# Patient Record
Sex: Female | Born: 1955
Health system: Southern US, Community
[De-identification: ages and names within clinical notes are randomized; demographics above are authoritative.]

## PROBLEM LIST (undated history)

## (undated) DIAGNOSIS — D171 Benign lipomatous neoplasm of skin and subcutaneous tissue of trunk: Secondary | ICD-10-CM

## (undated) DIAGNOSIS — Z923 Personal history of irradiation: Secondary | ICD-10-CM

## (undated) DIAGNOSIS — G4733 Obstructive sleep apnea (adult) (pediatric): Principal | ICD-10-CM

## (undated) DIAGNOSIS — I499 Cardiac arrhythmia, unspecified: Secondary | ICD-10-CM

## (undated) DIAGNOSIS — C801 Malignant (primary) neoplasm, unspecified: Secondary | ICD-10-CM

## (undated) DIAGNOSIS — I471 Supraventricular tachycardia: Secondary | ICD-10-CM

## (undated) DIAGNOSIS — I10 Essential (primary) hypertension: Secondary | ICD-10-CM

## (undated) DIAGNOSIS — C50911 Malignant neoplasm of unspecified site of right female breast: Secondary | ICD-10-CM

## (undated) DIAGNOSIS — E785 Hyperlipidemia, unspecified: Secondary | ICD-10-CM

## (undated) HISTORY — PX: STAPEDECTOMY: SHX2435

## (undated) HISTORY — PX: KNEE CARTILAGE SURGERY: SHX688

## (undated) HISTORY — DX: Essential (primary) hypertension: I10

## (undated) HISTORY — DX: Supraventricular tachycardia: I47.1

## (undated) HISTORY — DX: Hyperlipidemia, unspecified: E78.5

## (undated) HISTORY — PX: COLONOSCOPY: SHX174

## (undated) HISTORY — DX: Obstructive sleep apnea (adult) (pediatric): G47.33

---

## 1997-05-28 HISTORY — PX: ABDOMINAL HYSTERECTOMY: SHX81

## 1998-05-28 HISTORY — PX: OTHER SURGICAL HISTORY: SHX169

## 2002-05-28 DIAGNOSIS — I471 Supraventricular tachycardia, unspecified: Secondary | ICD-10-CM

## 2002-05-28 HISTORY — DX: Supraventricular tachycardia: I47.1

## 2002-05-28 HISTORY — DX: Supraventricular tachycardia, unspecified: I47.10

## 2012-12-16 ENCOUNTER — Ambulatory Visit (INDEPENDENT_AMBULATORY_CARE_PROVIDER_SITE_OTHER): Payer: 59 | Admitting: Cardiology

## 2012-12-16 ENCOUNTER — Encounter: Payer: Self-pay | Admitting: Cardiology

## 2012-12-16 VITALS — BP 116/78 | HR 67 | Ht 69.0 in | Wt 173.1 lb

## 2012-12-16 DIAGNOSIS — I471 Supraventricular tachycardia: Secondary | ICD-10-CM

## 2012-12-16 DIAGNOSIS — R002 Palpitations: Secondary | ICD-10-CM

## 2012-12-16 DIAGNOSIS — I498 Other specified cardiac arrhythmias: Secondary | ICD-10-CM

## 2012-12-16 NOTE — Patient Instructions (Signed)
I think the pulsations in your neck is probably your carotid pulse moving your jugular vein -- sometimes this will be more notable during premature beats.  You clearly have premature beats -- simply based on your history.  After your ablation, these are no longer of concern.  Marykay Lex, MD

## 2012-12-25 ENCOUNTER — Encounter: Payer: Self-pay | Admitting: Cardiology

## 2012-12-25 DIAGNOSIS — I471 Supraventricular tachycardia: Secondary | ICD-10-CM | POA: Insufficient documentation

## 2012-12-25 DIAGNOSIS — R002 Palpitations: Secondary | ICD-10-CM | POA: Insufficient documentation

## 2012-12-25 NOTE — Assessment & Plan Note (Signed)
Probably pretty successful ablation procedure, with no recurrence of SVT since the ablation. Prior to the ablation she was having them daily several times a day repeatedly.   The rhythm is very unlikely to recur. She is however very acutely aware of what feels like and will seek assistance if it occurs.

## 2012-12-25 NOTE — Progress Notes (Signed)
Patient ID: [name] [name], female   DOB: 1956/01/05, 57 y.o.   MRN: [id number]  PCP: Herb Grays, MD  Clinic Note: Chief Complaint  Patient presents with  . New Evaluation    aware of heart beat in carotid seveal bets at a time ,hx radio abalation 2004 in [place],no chest ain ,no sob, no edema     HPI: [name] [name] is a 57 y.o. female with a PMH below who presents today for evaluation of palpitations. She is a very interesting history in, that begins several years ago back in her home that she [place]. Her son unexpectedly died after an episode palpitations and was thought that had a ruptured aorta. Right around the time that she as well as couple of her siblings and cousins all had similar conditions in the bone at SVT. Very frequent SVTs that were very debilitating to her. She Underwent RFA ablation in 2004, and has had no further prolonged arrhythmia since. She does have intermittent palpitations, that she is probably more were of then mostly would be. She says that when she feels these symptoms they can make her feel fatigued. She'll and several of the time but nothing prolonged. She describes the bases irregular heartbeats that are couple in a row followed by a pause, never more than a few seconds.  Interval History: She saw Dr. Collins [place] back in July and noted feeling a sensation of a more the palpitations and she had in the past. She said they're not associated with any chest pain or discomfort. She is aware of them. It makes her feel somewhat fatigued in the time. She denies any lightheadedness, dizziness or wooziness. No syncope or near-syncope. No diaphoresis. No chest pain or shortness breath at rest or exertion. Symptoms do not get worse with exertion, in fact they get better / upper respiratory with exertion. She's very healthy, slim is maybe 45 minutes a day in the morning she does not take any medications. She does not drink lots of caffeine simply because of her history.  She  denies any known hematochezia or hematuria. No TIA or amaurosis fugax symptoms.    In general she is not overly worried about these symptoms, she just thought that she may want meet a cardiologist in town since she does have a history of SVT ablation.  Past Medical History  Diagnosis Date  . Paroxysmal SVT (supraventricular tachycardia) 2004    Status post RFA ABLATION    Prior Cardiac Evaluation and Past Surgical History: Past Surgical History  Procedure Laterality Date  . Stapedectomy    . Abdominal hysterectomy  1999    WITH BLADDER REPAIR FOR PROPLAPSE-HAS OVARIES (NO CA)  . Svt ablation  2000   No Known Allergies   History   Social History  . Marital Status: Married    Spouse Name: N/A    Number of Children: N/A  . Years of Education: N/A   Occupational History  . Not on file.   Social History Main Topics  . Smoking status: Former Smoker    Start date: 12/16/1977  . Smokeless tobacco: Not on file  . Alcohol Use: 2.5 oz/week    5 drink(s) per week     Comment: red wine  . Drug Use: No  . Sexually Active: Not on file   Other Topics Concern  . Not on file   Social History Narrative   She is married to [employer]. They have 2 children, and one son died at age 21 with  complications of an arrhythmia -- presumably yet record aorta.   Is a former smoker -- quit over 30 years ago. She maybe has one or 2 vessel Redwine periodically. Currently unemployed. She recently returned related to narcotic from [facility].    ROS: A comprehensive Review of Systems - Negative except Pertinent cardiac positive as noted above. Noncardiac concerns -  facial moles.  PHYSICAL EXAM BP 116/78  Pulse 67  Ht 5\' 9"  (1.753 m)  Wt 173 lb 1.6 oz (78.518 kg)  BMI 25.55 kg/m2 General appearance: alert, cooperative, appears stated age, no distress and healthy-appearing. Well-nourished and well-groomed. As a mood and affect. Its questions appropriately. Very knowledgeable of her history. She  does seem to be anxious at baseline but in no acute distress. Not overly concerned about her symptoms. HEENT: [place]/AT, EOMI, MMM, anicteric sclera Neck: no adenopathy, no carotid bruit, no JVD, supple, symmetrical, trachea midline and thyroid not enlarged, symmetric, no tenderness/mass/nodules Lungs: clear to auscultation bilaterally, normal percussion bilaterally and good anatomic nonlabored Heart: regular rate and rhythm, S1, S2 normal, no murmur, click, rub or gallop, normal apical impulse and occasional ectopic beats noted. Abdomen: soft, non-tender; bowel sounds normal; no masses,  no organomegaly Extremities: extremities normal, atraumatic, no cyanosis or edema, no edema, redness or tenderness in the calves or thighs and no ulcers, gangrene or trophic changes Pulses: 2+ and symmetric Neurologic: Alert and oriented X 3, normal strength and tone. Normal symmetric reflexes. Normal coordination and gait  [id number] today: Yes Rate: 67 , Rhythm: Normal sinus rhythm, possible left atrial enlargement. Otherwise normal ECG.  Recent Labs: None  ASSESSMENT / PLAN: Relatively healthy woman who looks much younger than her stated age. She has an exciting history, but no active arrhythmias.   Palpitations As it is he describing are most likely PACs or PVCs. They do not sound like an arrhythmia, which is something she is quite aware of. The fact they don't last more than a few seconds would clearly argue against an arrhythmia.She is not interested in taking medications to treat them, unless they become bothersome to her.  Will simply monitor these symptoms, would only treat them if they become significant for her. She is not excited about concept of taking a beta blocker or calcium channel blocker at this point. She is simply an attempt to avoid anything with caffeine in it that can trigger these spells.  History: SVT (supraventricular tachycardia), status post RFA ablation Probably pretty successful  ablation procedure, with no recurrence of SVT since the ablation. Prior to the ablation she was having them daily several times a day repeatedly.   The rhythm is very unlikely to recur. She is however very acutely aware of what feels like and will seek assistance if it occurs.   Followup: When necessary.  We are always available.  DAVID W. Herbie Baltimore, M.D., M.S. [facility] [address] [place], [place]  [address]  [phone] Pager # [phone]

## 2012-12-25 NOTE — Assessment & Plan Note (Addendum)
As it is he describing are most likely PACs or PVCs. They do not sound like an arrhythmia, which is something she is quite aware of. The fact they don't last more than a few seconds would clearly argue against an arrhythmia.She is not interested in taking medications to treat them, unless they become bothersome to her.  Will simply monitor these symptoms, would only treat them if they become significant for her. She is not excited about concept of taking a beta blocker or calcium channel blocker at this point. She is simply an attempt to avoid anything with caffeine in it that can trigger these spells.

## 2014-05-07 ENCOUNTER — Emergency Department (HOSPITAL_BASED_OUTPATIENT_CLINIC_OR_DEPARTMENT_OTHER): Payer: 59

## 2014-05-07 ENCOUNTER — Emergency Department (HOSPITAL_BASED_OUTPATIENT_CLINIC_OR_DEPARTMENT_OTHER)
Admission: EM | Admit: 2014-05-07 | Discharge: 2014-05-07 | Disposition: A | Discharge status: Home / Self Care | Payer: 59 | Attending: Emergency Medicine | Admitting: Emergency Medicine

## 2014-05-07 ENCOUNTER — Encounter (HOSPITAL_BASED_OUTPATIENT_CLINIC_OR_DEPARTMENT_OTHER): Payer: Self-pay

## 2014-05-07 DIAGNOSIS — Y9389 Activity, other specified: Secondary | ICD-10-CM | POA: Diagnosis not present

## 2014-05-07 DIAGNOSIS — S8991XA Unspecified injury of right lower leg, initial encounter: Secondary | ICD-10-CM | POA: Diagnosis present

## 2014-05-07 DIAGNOSIS — Z8679 Personal history of other diseases of the circulatory system: Secondary | ICD-10-CM | POA: Insufficient documentation

## 2014-05-07 DIAGNOSIS — Y998 Other external cause status: Secondary | ICD-10-CM | POA: Diagnosis not present

## 2014-05-07 DIAGNOSIS — Y9289 Other specified places as the place of occurrence of the external cause: Secondary | ICD-10-CM | POA: Diagnosis not present

## 2014-05-07 DIAGNOSIS — Z87891 Personal history of nicotine dependence: Secondary | ICD-10-CM | POA: Insufficient documentation

## 2014-05-07 DIAGNOSIS — T1490XA Injury, unspecified, initial encounter: Secondary | ICD-10-CM

## 2014-05-07 DIAGNOSIS — W541XXA Struck by dog, initial encounter: Secondary | ICD-10-CM | POA: Diagnosis not present

## 2014-05-07 MED ORDER — HYDROCODONE-ACETAMINOPHEN 5-325 MG PO TABS: 2.0000 | ORAL_TABLET | ORAL | Status: DC | PRN

## 2014-05-07 NOTE — ED Provider Notes (Signed)
CSN: [id number]     Arrival date & time 05/07/14  1212 History   First MD Initiated Contact with Patient 05/07/14 1350     Chief Complaint  Patient presents with  . Fall     (Consider location/radiation/quality/duration/timing/severity/associated sxs/prior Treatment) HPI Comments: Patient is a 58 year old female who presents with right knee pain after a fall that occurred this morning. Patient reports the neighbor's door came running over to her yard to see her dog and ran directly into her right knee. She reports falling back to the ground. She reports sudden onset of throbbing and severe right knee pain without radiation. No other injury. No head trauma or LOC. Weight bearing activity makes the pain worse. No alleviating factors. No other injury.   Patient is a 58 y.o. female presenting with fall.  Fall Associated symptoms include arthralgias and joint swelling.    Past Medical History  Diagnosis Date  . Paroxysmal SVT (supraventricular tachycardia) 2004    Status post RFA ABLATION   Past Surgical History  Procedure Laterality Date  . Stapedectomy    . Abdominal hysterectomy  1999    WITH BLADDER REPAIR FOR PROPLAPSE-HAS OVARIES (NO CA)  . Svt ablation  2000  . Knee cartilage surgery     Family History  Problem Relation Age of Onset  . CVA Mother   . Bladder Cancer Father   . CVA Maternal Grandmother     x2  . Heart attack Maternal Grandfather   . Leukemia Paternal Grandfather   . Supraventricular tachycardia Son     Actually passed away at age 35.  . Supraventricular tachycardia Sister   . Supraventricular tachycardia Brother   . Supraventricular tachycardia Grandchild     4 grandchildren   History  Substance Use Topics  . Smoking status: Former Smoker    Start date: 12/16/1977  . Smokeless tobacco: Not on file  . Alcohol Use: Yes     Comment: red wine   OB History    No data available     Review of Systems  Musculoskeletal: Positive for joint swelling  and arthralgias.  All other systems reviewed and are negative.     Allergies  Review of patient's allergies indicates no known allergies.  Home Medications   Prior to Admission medications   Not on File   BP 136/87 mmHg  Pulse 80  Temp(Src) 98.3 F (36.8 C) (Oral)  Resp 18  Ht 5\' 8"  (1.727 m)  Wt 178 lb (80.74 kg)  BMI 27.07 kg/m2  SpO2 99% Physical Exam  Constitutional: She is oriented to person, place, and time. She appears well-developed and well-nourished. No distress.  HENT:  Head: Normocephalic and atraumatic.  Eyes: Conjunctivae and EOM are normal.  Neck: Normal range of motion.  Cardiovascular: Normal rate, regular rhythm and intact distal pulses.  Exam reveals no gallop and no friction rub.   No murmur heard. Pulmonary/Chest: Effort normal and breath sounds normal. She has no wheezes. She has no rales. She exhibits no tenderness.  Abdominal: Soft. She exhibits no distension. There is no tenderness. There is no rebound and no guarding.  Musculoskeletal: Normal range of motion.  Full ROM of right knee. Mild anterior edema and tenderness to palpation. No obvious deformity. Negative Lachmans test. No popliteal tenderness.    Neurological: She is alert and oriented to person, place, and time. Coordination normal.  Speech is goal-oriented. Moves limbs without ataxia.   Skin: Skin is warm and dry.  Psychiatric: She has  a normal mood and affect. Her behavior is normal.  Nursing note and vitals reviewed.   ED Course  Procedures (including critical care time) Labs Review Labs Reviewed - No data to display  Imaging Review Dg Knee Complete 4 Views Right  05/07/2014   CLINICAL DATA:  Injury by dog today.  Initial evaluation .  EXAM: RIGHT KNEE - COMPLETE 4+ VIEW  COMPARISON:  None.  FINDINGS: There is no evidence of fracture, dislocation, or joint effusion. There is no evidence of arthropathy or other focal bone abnormality. Soft tissues are unremarkable.  IMPRESSION:  Negative.   Electronically Signed   By: Marcello Moores  Register   On: 05/07/2014 13:15     EKG Interpretation None      MDM   Final diagnoses:  Right knee injury, initial encounter    2:12 PM Patient's xray unremarkable for acute changes. Vitals stable and patient afebrile. Patient is unable to bear weight on the right leg. Patient has crutches and knee immobilizer at home that she is instructed to use. Patient sees orthopedist at [facility]. Patient instructed to follow up at the office.    Alvina Chou, PA-C 05/07/14 [place], MD 05/10/14 [phone]

## 2014-05-07 NOTE — Discharge Instructions (Signed)
Take Vicodin as needed for pain. Do not bear weight on your right knee. Rest, ice, and elevate your knee. Refer to attached documents for more information.

## 2014-05-07 NOTE — ED Notes (Addendum)
A dog ran into pt-knocked her to the ground-pain to right knee-walking with crutches

## 2014-05-24 ENCOUNTER — Other Ambulatory Visit: Payer: Self-pay | Admitting: Orthopaedic Surgery

## 2014-05-24 DIAGNOSIS — M25561 Pain in right knee: Secondary | ICD-10-CM

## 2014-05-25 ENCOUNTER — Other Ambulatory Visit (HOSPITAL_COMMUNITY): Payer: Self-pay | Admitting: Orthopaedic Surgery

## 2014-05-25 ENCOUNTER — Ambulatory Visit (HOSPITAL_COMMUNITY)
Admission: RE | Admit: 2014-05-25 | Discharge: 2014-05-25 | Disposition: A | Discharge status: Home / Self Care | Payer: 59 | Source: Ambulatory Visit | Attending: Orthopaedic Surgery | Admitting: Orthopaedic Surgery

## 2014-05-25 ENCOUNTER — Ambulatory Visit
Admission: RE | Admit: 2014-05-25 | Discharge: 2014-05-25 | Disposition: A | Discharge status: Home / Self Care | Payer: 59 | Source: Ambulatory Visit | Attending: Orthopaedic Surgery | Admitting: Orthopaedic Surgery

## 2014-05-25 DIAGNOSIS — W541XXA Struck by dog, initial encounter: Secondary | ICD-10-CM | POA: Diagnosis not present

## 2014-05-25 DIAGNOSIS — M25461 Effusion, right knee: Secondary | ICD-10-CM | POA: Diagnosis not present

## 2014-05-25 DIAGNOSIS — M25561 Pain in right knee: Secondary | ICD-10-CM

## 2014-05-25 DIAGNOSIS — S89001A Unspecified physeal fracture of upper end of right tibia, initial encounter for closed fracture: Secondary | ICD-10-CM | POA: Insufficient documentation

## 2014-05-25 DIAGNOSIS — S8990XA Unspecified injury of unspecified lower leg, initial encounter: Secondary | ICD-10-CM | POA: Diagnosis present

## 2014-05-25 DIAGNOSIS — M2241 Chondromalacia patellae, right knee: Secondary | ICD-10-CM | POA: Insufficient documentation

## 2014-05-28 DIAGNOSIS — C801 Malignant (primary) neoplasm, unspecified: Secondary | ICD-10-CM

## 2014-05-28 DIAGNOSIS — C50911 Malignant neoplasm of unspecified site of right female breast: Secondary | ICD-10-CM

## 2014-05-28 HISTORY — DX: Malignant (primary) neoplasm, unspecified: C80.1

## 2014-05-28 HISTORY — DX: Malignant neoplasm of unspecified site of right female breast: C50.911

## 2014-09-30 ENCOUNTER — Other Ambulatory Visit: Payer: Self-pay | Admitting: Radiology

## 2014-10-07 ENCOUNTER — Other Ambulatory Visit: Payer: Self-pay | Admitting: General Surgery

## 2014-10-07 DIAGNOSIS — C50911 Malignant neoplasm of unspecified site of right female breast: Secondary | ICD-10-CM

## 2014-10-07 DIAGNOSIS — Z170 Estrogen receptor positive status [ER+]: Principal | ICD-10-CM

## 2014-10-15 ENCOUNTER — Telehealth: Payer: Self-pay | Admitting: *Deleted

## 2014-10-15 NOTE — Telephone Encounter (Signed)
Received referral from [facility].  Called and left a message for the pt to return my call so I can schedule a med onc appt.  

## 2014-10-18 ENCOUNTER — Telehealth: Payer: Self-pay | Admitting: *Deleted

## 2014-10-18 NOTE — Telephone Encounter (Signed)
Received message from pt returning my call.  Called and left a message for the pt to return my call so I can schedule her a med onc appt.

## 2014-10-18 NOTE — Telephone Encounter (Signed)
Pt returned my call and I confirmed 10/22/14 appt w/ her.  Unable to mail before appt letter - gave verbal.  Unable to mail welcoming packet - gave directions and instructions.  Unable to mail intake form - placed a note for one to be given at time of check in.  Emailed Engineer, civil (consulting) at [employer] to make her aware.  Placed a copy of records in Dr. Geralyn Flash box and took one to HIM to scan.

## 2014-10-22 ENCOUNTER — Ambulatory Visit: Payer: 59

## 2014-10-22 ENCOUNTER — Ambulatory Visit (HOSPITAL_BASED_OUTPATIENT_CLINIC_OR_DEPARTMENT_OTHER): Payer: 59 | Admitting: Hematology and Oncology

## 2014-10-22 ENCOUNTER — Telehealth: Payer: Self-pay | Admitting: Hematology and Oncology

## 2014-10-22 ENCOUNTER — Encounter: Payer: Self-pay | Admitting: *Deleted

## 2014-10-22 ENCOUNTER — Encounter: Payer: Self-pay | Admitting: Hematology and Oncology

## 2014-10-22 VITALS — BP 129/70 | HR 86 | Temp 97.7°F | Resp 18 | Ht 69.0 in | Wt 173.7 lb

## 2014-10-22 DIAGNOSIS — C50411 Malignant neoplasm of upper-outer quadrant of right female breast: Secondary | ICD-10-CM | POA: Diagnosis not present

## 2014-10-22 DIAGNOSIS — Z801 Family history of malignant neoplasm of trachea, bronchus and lung: Secondary | ICD-10-CM

## 2014-10-22 DIAGNOSIS — Z803 Family history of malignant neoplasm of breast: Secondary | ICD-10-CM | POA: Diagnosis not present

## 2014-10-22 DIAGNOSIS — Z170 Estrogen receptor positive status [ER+]: Secondary | ICD-10-CM | POA: Diagnosis not present

## 2014-10-22 DIAGNOSIS — Z8041 Family history of malignant neoplasm of ovary: Secondary | ICD-10-CM

## 2014-10-22 DIAGNOSIS — Z8052 Family history of malignant neoplasm of bladder: Secondary | ICD-10-CM

## 2014-10-22 DIAGNOSIS — Z87891 Personal history of nicotine dependence: Secondary | ICD-10-CM | POA: Diagnosis not present

## 2014-10-22 NOTE — Assessment & Plan Note (Signed)
Right breast biopsy 09/30/2014 invasive ductal carcinoma grade 1, Ki-67 5%, ER positive, PR positive, HER-2 negative, 1.4 cm mass at 11:30 position no lymph nodes by ultrasound T1 cN0 M0 stage IA clinical stage  Pathology and radiology counseling:Discussed with the patient, the details of pathology including the type of breast cancer,the clinical staging, the significance of ER, PR and HER-2/neu receptors and the implications for treatment. After reviewing the pathology in detail, we proceeded to discuss the different treatment options between surgery, radiation, chemotherapy, antiestrogen therapies.  Recommendations: 1. Breast conserving surgery followed by 2. Oncotype DX testing to determine if chemotherapy would be of any benefit followed by 3. Adjuvant radiation therapy followed by 4. Adjuvant antiestrogen therapy  Oncotype counseling: I discussed Oncotype DX test. I explained to the patient that this is a 21 gene panel to evaluate patient tumors DNA to calculate recurrence score. This would help determine whether patient has high risk or intermediate risk or low risk breast cancer. She understands that if her tumor was found to be high risk, she would benefit from systemic chemotherapy. If low risk, no need of chemotherapy. If she was found to be intermediate risk, we would need to evaluate the score as well as other risk factors and determine if an abbreviated chemotherapy may be of benefit.  Patient's surgery is scheduled for 11/02/2014. I will see her walker Oncotype results become available.

## 2014-10-22 NOTE — Progress Notes (Signed)
[facility] CONSULT NOTE  Patient Care Team: [name], MD as PCP - General (Nurse Practitioner)  CHIEF COMPLAINTS/PURPOSE OF CONSULTATION:  Newly diagnosed breast cancer  HISTORY OF PRESENTING ILLNESS:  [name] [name] 59 y.o. female is here because of recent diagnosis of right breast cancer. Patient had a routine screening mammograms 3-D which detected abnormality in the right breast. This was evaluated by ultrasound and a biopsy was performed. The ultrasound revealed a 1.4 send a mass effect 11:30 position. No lymph nodes were identified. The biopsy came back as invasive ductal carcinoma grade 1 with a Ki-67 5% that was ER/PR positive HER-2 negative. She was seen by Dr. Excell Seltzer who sent her for discussion regarding treatment options. Patient scheduled to have a lumpectomy on 11/02/2014.  I reviewed her records extensively and collaborated the history with the patient.  SUMMARY OF ONCOLOGIC HISTORY:   Primary cancer of upper outer quadrant of right breast   09/30/2014 Initial Diagnosis Right breast invasive ductal carcinoma grade 1, Ki-67 5%, ER positive, PR positive, HER-2 negative, 1.4 cm mass at 11:30 position no lymph nodes by ultrasound T1 cN0 M0 stage IA clinical stage    In terms of breast cancer risk profile:  She menarched at early age of 22 and went to menopause at age 61  She had 2 pregnancy, her first child was born at age 33  She has received birth control pills for approximately 5 years.  She was never exposed to fertility medications or hormone replacement therapy.  She has  family history of Breast/GYN/GI cancer Father age 53 bladder cancer, cousin age 16 breast cancer; 2 aunts and 30s with lung cancer one aunt in 54s with lung cancer and another aunt in 33s with ovarian cancer  MEDICAL HISTORY:  Past Medical History  Diagnosis Date  . Paroxysmal SVT (supraventricular tachycardia) 2004    Status post RFA ABLATION    SURGICAL HISTORY: Past Surgical  History  Procedure Laterality Date  . Stapedectomy    . Abdominal hysterectomy  1999    WITH BLADDER REPAIR FOR PROPLAPSE-HAS OVARIES (NO CA)  . Svt ablation  2000  . Knee cartilage surgery      SOCIAL HISTORY: History   Social History  . Marital Status: Married    Spouse Name: N/A  . Number of Children: N/A  . Years of Education: N/A   Occupational History  . Not on file.   Social History Main Topics  . Smoking status: Former Smoker    Start date: 12/16/1977  . Smokeless tobacco: Not on file  . Alcohol Use: Yes     Comment: red wine  . Drug Use: No  . Sexual Activity: Not on file   Other Topics Concern  . Not on file   Social History Narrative   She is married to [employer]. They have 2 children, and one son died at age 54 with complications of an arrhythmia -- presumably yet record aorta.   Is a former smoker -- quit over 30 years ago. She maybe has one or 2 vessel [place]wine periodically. Currently unemployed. She recently returned related to narcotic from [facility].    FAMILY HISTORY: Family History  Problem Relation Age of Onset  . CVA Mother   . Bladder Cancer Father   . CVA Maternal Grandmother     x2  . Heart attack Maternal Grandfather   . Leukemia Paternal Grandfather   . Supraventricular tachycardia Son     Actually passed away at age  19.  . Supraventricular tachycardia Sister   . Supraventricular tachycardia Brother   . Supraventricular tachycardia Grandchild     4 grandchildren    ALLERGIES:  has No Known Allergies.  MEDICATIONS:  Current Outpatient Prescriptions  Medication Sig Dispense Refill  . HYDROcodone-acetaminophen (NORCO/VICODIN) 5-325 MG per tablet Take 2 tablets by mouth every 4 (four) hours as needed for moderate pain or severe pain. 20 tablet 0   No current facility-administered medications for this visit.    REVIEW OF SYSTEMS:   Constitutional: Denies fevers, chills or abnormal night sweats Eyes: Denies blurriness of vision,  double vision or watery eyes Ears, nose, mouth, throat, and face: Denies mucositis or sore throat Respiratory: Denies cough, dyspnea or wheezes Cardiovascular: Denies palpitation, chest discomfort or lower extremity swelling Gastrointestinal:  Denies nausea, heartburn or change in bowel habits Skin: Denies abnormal skin rashes Lymphatics: Denies new lymphadenopathy or easy bruising Neurological:Denies numbness, tingling or new weaknesses Behavioral/Psych: Mood is stable, no new changes  Breast:  Denies any palpable lumps or discharge All other systems were reviewed with the patient and are negative.  PHYSICAL EXAMINATION: ECOG PERFORMANCE STATUS: 0 - Asymptomatic  Filed Vitals:   10/22/14 1244  BP: 129/70  Pulse: 86  Temp: 97.7 F (36.5 C)  Resp: 18   Filed Weights   10/22/14 1244  Weight: 173 lb 11.2 oz (78.79 kg)    GENERAL:alert, no distress and comfortable SKIN: skin color, texture, turgor are normal, no rashes or significant lesions EYES: normal, conjunctiva are pink and non-injected, sclera clear OROPHARYNX:no exudate, no erythema and lips, buccal mucosa, and tongue normal  NECK: supple, thyroid normal size, non-tender, without nodularity LYMPH:  no palpable lymphadenopathy in the cervical, axillary or inguinal LUNGS: clear to auscultation and percussion with normal breathing effort HEART: regular rate & rhythm and no murmurs and no lower extremity edema ABDOMEN:abdomen soft, non-tender and normal bowel sounds Musculoskeletal:no cyanosis of digits and no clubbing  PSYCH: alert & oriented x 3 with fluent speech NEURO: no focal motor/sensory deficits BREAST: No palpable nodules in breast. No palpable axillary or supraclavicular lymphadenopathy (exam performed in the presence of a chaperone)   ASSESSMENT AND PLAN:  Primary cancer of upper outer quadrant of right breast Right breast biopsy 09/30/2014 invasive ductal carcinoma grade 1, Ki-67 5%, ER positive, PR positive,  HER-2 negative, 1.4 cm mass at 11:30 position no lymph nodes by ultrasound T1 cN0 M0 stage IA clinical stage  Pathology and radiology counseling:Discussed with the patient, the details of pathology including the type of breast cancer,the clinical staging, the significance of ER, PR and HER-2/neu receptors and the implications for treatment. After reviewing the pathology in detail, we proceeded to discuss the different treatment options between surgery, radiation, chemotherapy, antiestrogen therapies.  Recommendations: 1. Breast conserving surgery followed by 2. Oncotype DX testing to determine if chemotherapy would be of any benefit followed by 3. Adjuvant radiation therapy followed by 4. Adjuvant antiestrogen therapy  Oncotype counseling: I discussed Oncotype DX test. I explained to the patient that this is a 21 gene panel to evaluate patient tumors DNA to calculate recurrence score. This would help determine whether patient has high risk or intermediate risk or low risk breast cancer. She understands that if her tumor was found to be high risk, she would benefit from systemic chemotherapy. If low risk, no need of chemotherapy. If she was found to be intermediate risk, we would need to evaluate the score as well as other risk factors and determine if  an abbreviated chemotherapy may be of benefit.  Patient's surgery is scheduled for 11/02/2014. I will see her walker Oncotype results become available.  All questions were answered. The patient knows to call the clinic with any problems, questions or concerns.    Rulon Eisenmenger, MD 1:46 PM

## 2014-10-22 NOTE — Telephone Encounter (Signed)
Gave avs & calendar for June. °

## 2014-10-22 NOTE — Progress Notes (Signed)
Met with pt after new pt appt. Gave navigation resources and contact information. Discussed care plan summary and next steps.  Encourage pt to call with questions or concerns. Received verbal understanding.

## 2014-10-22 NOTE — Addendum Note (Signed)
Addended by: Renford Dills on: 10/22/2014 03:22 PM   Modules accepted: Orders, Medications

## 2014-10-22 NOTE — Progress Notes (Signed)
I checked in new patient with no issues prior to seeing the dr. She has packet to fill out per notes.

## 2014-10-29 ENCOUNTER — Encounter (HOSPITAL_BASED_OUTPATIENT_CLINIC_OR_DEPARTMENT_OTHER): Payer: Self-pay | Admitting: *Deleted

## 2014-11-02 ENCOUNTER — Encounter (HOSPITAL_BASED_OUTPATIENT_CLINIC_OR_DEPARTMENT_OTHER): Payer: Self-pay | Admitting: *Deleted

## 2014-11-02 ENCOUNTER — Ambulatory Visit (HOSPITAL_BASED_OUTPATIENT_CLINIC_OR_DEPARTMENT_OTHER): Payer: 59 | Admitting: Anesthesiology

## 2014-11-02 ENCOUNTER — Ambulatory Visit (HOSPITAL_BASED_OUTPATIENT_CLINIC_OR_DEPARTMENT_OTHER)
Admission: RE | Admit: 2014-11-02 | Discharge: 2014-11-02 | Disposition: A | Discharge status: Home / Self Care | Payer: 59 | Source: Ambulatory Visit | Attending: General Surgery | Admitting: General Surgery

## 2014-11-02 ENCOUNTER — Encounter (HOSPITAL_COMMUNITY)
Admission: RE | Admit: 2014-11-02 | Discharge: 2014-11-02 | Disposition: A | Discharge status: Home / Self Care | Payer: 59 | Source: Ambulatory Visit | Attending: General Surgery | Admitting: General Surgery

## 2014-11-02 ENCOUNTER — Encounter (HOSPITAL_BASED_OUTPATIENT_CLINIC_OR_DEPARTMENT_OTHER)
Admission: RE | Disposition: A | Discharge status: Home / Self Care | Payer: Self-pay | Source: Ambulatory Visit | Attending: General Surgery

## 2014-11-02 DIAGNOSIS — C50411 Malignant neoplasm of upper-outer quadrant of right female breast: Secondary | ICD-10-CM | POA: Diagnosis not present

## 2014-11-02 DIAGNOSIS — Z170 Estrogen receptor positive status [ER+]: Secondary | ICD-10-CM | POA: Insufficient documentation

## 2014-11-02 DIAGNOSIS — N959 Unspecified menopausal and perimenopausal disorder: Secondary | ICD-10-CM | POA: Diagnosis not present

## 2014-11-02 DIAGNOSIS — C50911 Malignant neoplasm of unspecified site of right female breast: Secondary | ICD-10-CM | POA: Insufficient documentation

## 2014-11-02 HISTORY — PX: BREAST LUMPECTOMY WITH RADIOACTIVE SEED AND SENTINEL LYMPH NODE BIOPSY: SHX6550

## 2014-11-02 HISTORY — DX: Malignant (primary) neoplasm, unspecified: C80.1

## 2014-11-02 HISTORY — DX: Cardiac arrhythmia, unspecified: I49.9

## 2014-11-02 SURGERY — BREAST LUMPECTOMY WITH RADIOACTIVE SEED AND SENTINEL LYMPH NODE BIOPSY
Anesthesia: General | Site: Breast | Laterality: Right

## 2014-11-02 MED ORDER — LACTATED RINGERS IV SOLN
INTRAVENOUS | Status: DC
  Administered 2014-11-02 (×2): via INTRAVENOUS

## 2014-11-02 MED ORDER — PROPOFOL 10 MG/ML IV BOLUS
INTRAVENOUS | Status: AC
  Filled 2014-11-02: qty 20

## 2014-11-02 MED ORDER — FENTANYL CITRATE (PF) 100 MCG/2ML IJ SOLN
50.0000 ug | INTRAMUSCULAR | Status: DC | PRN
  Administered 2014-11-02: 100 ug via INTRAVENOUS

## 2014-11-02 MED ORDER — MIDAZOLAM HCL 2 MG/2ML IJ SOLN
1.0000 mg | INTRAMUSCULAR | Status: DC | PRN
  Administered 2014-11-02: 2 mg via INTRAVENOUS

## 2014-11-02 MED ORDER — OXYCODONE HCL 5 MG PO TABS
ORAL_TABLET | ORAL | Status: AC
  Filled 2014-11-02: qty 1

## 2014-11-02 MED ORDER — MIDAZOLAM HCL 2 MG/2ML IJ SOLN
INTRAMUSCULAR | Status: AC
  Filled 2014-11-02: qty 2

## 2014-11-02 MED ORDER — BUPIVACAINE-EPINEPHRINE (PF) 0.25% -1:200000 IJ SOLN
INTRAMUSCULAR | Status: DC | PRN
  Administered 2014-11-02: 10 mL

## 2014-11-02 MED ORDER — FENTANYL CITRATE (PF) 100 MCG/2ML IJ SOLN
INTRAMUSCULAR | Status: AC
  Filled 2014-11-02: qty 6

## 2014-11-02 MED ORDER — FENTANYL CITRATE (PF) 100 MCG/2ML IJ SOLN
INTRAMUSCULAR | Status: AC
  Filled 2014-11-02: qty 2

## 2014-11-02 MED ORDER — PROPOFOL 10 MG/ML IV BOLUS
INTRAVENOUS | Status: DC | PRN
  Administered 2014-11-02: 200 mg via INTRAVENOUS

## 2014-11-02 MED ORDER — FLEET ENEMA 7-19 GM/118ML RE ENEM: 1.0000 | ENEMA | Freq: Once | RECTAL | Status: DC

## 2014-11-02 MED ORDER — CEFAZOLIN SODIUM-DEXTROSE 2-3 GM-% IV SOLR
2.0000 g | INTRAVENOUS | Status: AC
  Administered 2014-11-02: 2 g via INTRAVENOUS

## 2014-11-02 MED ORDER — OXYCODONE HCL 5 MG/5ML PO SOLN
5.0000 mg | Freq: Once | ORAL | Status: AC | PRN
Start: 2014-11-02 — End: 2014-11-02

## 2014-11-02 MED ORDER — CEFAZOLIN SODIUM-DEXTROSE 2-3 GM-% IV SOLR
INTRAVENOUS | Status: AC
  Filled 2014-11-02: qty 50

## 2014-11-02 MED ORDER — FENTANYL CITRATE (PF) 100 MCG/2ML IJ SOLN
25.0000 ug | INTRAMUSCULAR | Status: DC | PRN
  Administered 2014-11-02 (×3): 25 ug via INTRAVENOUS

## 2014-11-02 MED ORDER — SODIUM CHLORIDE 0.9 % IJ SOLN
INTRAMUSCULAR | Status: DC | PRN
  Administered 2014-11-02: 5 mL

## 2014-11-02 MED ORDER — TECHNETIUM TC 99M SULFUR COLLOID FILTERED
1.0000 | Freq: Once | INTRAVENOUS | Status: AC | PRN
  Administered 2014-11-02: 1 via INTRADERMAL

## 2014-11-02 MED ORDER — DEXAMETHASONE SODIUM PHOSPHATE 4 MG/ML IJ SOLN
INTRAMUSCULAR | Status: DC | PRN
  Administered 2014-11-02: 10 mg via INTRAVENOUS

## 2014-11-02 MED ORDER — GLYCOPYRROLATE 0.2 MG/ML IJ SOLN
0.2000 mg | Freq: Once | INTRAMUSCULAR | Status: AC | PRN
  Administered 2014-11-02: 0.2 mg via INTRAVENOUS

## 2014-11-02 MED ORDER — OXYCODONE HCL 5 MG PO TABS
5.0000 mg | ORAL_TABLET | Freq: Once | ORAL | Status: AC | PRN
  Administered 2014-11-02: 5 mg via ORAL

## 2014-11-02 MED ORDER — BUPIVACAINE-EPINEPHRINE (PF) 0.5% -1:200000 IJ SOLN
INTRAMUSCULAR | Status: DC | PRN
  Administered 2014-11-02: 30 mL via PERINEURAL

## 2014-11-02 MED ORDER — HYDROCODONE-ACETAMINOPHEN 5-325 MG PO TABS: 1.0000 | ORAL_TABLET | ORAL | Status: DC | PRN

## 2014-11-02 MED ORDER — ONDANSETRON HCL 4 MG/2ML IJ SOLN: 4.0000 mg | Freq: Four times a day (QID) | INTRAMUSCULAR | Status: DC | PRN

## 2014-11-02 MED ORDER — LIDOCAINE HCL (CARDIAC) 20 MG/ML IV SOLN
INTRAVENOUS | Status: DC | PRN
  Administered 2014-11-02: 80 mg via INTRAVENOUS

## 2014-11-02 SURGICAL SUPPLY — 53 items
APPLIER CLIP 9.375 MED OPEN (MISCELLANEOUS) ×2
BINDER BREAST LRG (GAUZE/BANDAGES/DRESSINGS) IMPLANT
BINDER BREAST MEDIUM (GAUZE/BANDAGES/DRESSINGS) IMPLANT
BINDER BREAST XLRG (GAUZE/BANDAGES/DRESSINGS) IMPLANT
BINDER BREAST XXLRG (GAUZE/BANDAGES/DRESSINGS) IMPLANT
BLADE SURG 15 STRL LF DISP TIS (BLADE) ×1 IMPLANT
BLADE SURG 15 STRL SS (BLADE) ×1
CANISTER SUC SOCK COL 7IN (MISCELLANEOUS) IMPLANT
CANISTER SUCT 1200ML W/VALVE (MISCELLANEOUS) IMPLANT
CHLORAPREP W/TINT 26ML (MISCELLANEOUS) ×2 IMPLANT
CLIP APPLIE 9.375 MED OPEN (MISCELLANEOUS) ×1 IMPLANT
COVER BACK TABLE 60X90IN (DRAPES) ×2 IMPLANT
COVER MAYO STAND STRL (DRAPES) ×2 IMPLANT
COVER PROBE W GEL 5X96 (DRAPES) ×2 IMPLANT
COVER SURGICAL LIGHT HANDLE (MISCELLANEOUS) ×2 IMPLANT
DECANTER SPIKE VIAL GLASS SM (MISCELLANEOUS) IMPLANT
DEVICE DUBIN W/COMP PLATE 8390 (MISCELLANEOUS) ×2 IMPLANT
DRAPE LAPAROSCOPIC ABDOMINAL (DRAPES) ×2 IMPLANT
DRAPE UTILITY XL STRL (DRAPES) ×2 IMPLANT
ELECT COATED BLADE 2.86 ST (ELECTRODE) ×2 IMPLANT
ELECT REM PT RETURN 9FT ADLT (ELECTROSURGICAL) ×2
ELECTRODE REM PT RTRN 9FT ADLT (ELECTROSURGICAL) ×1 IMPLANT
GLOVE BIOGEL M 7.0 STRL (GLOVE) ×2 IMPLANT
GLOVE BIOGEL PI IND STRL 6.5 (GLOVE) ×3 IMPLANT
GLOVE BIOGEL PI IND STRL 7.5 (GLOVE) ×1 IMPLANT
GLOVE BIOGEL PI IND STRL 8 (GLOVE) ×1 IMPLANT
GLOVE BIOGEL PI INDICATOR 6.5 (GLOVE) ×3
GLOVE BIOGEL PI INDICATOR 7.5 (GLOVE) ×1
GLOVE BIOGEL PI INDICATOR 8 (GLOVE) ×1
GLOVE ECLIPSE 6.5 STRL STRAW (GLOVE) ×4 IMPLANT
GLOVE ECLIPSE 7.5 STRL STRAW (GLOVE) ×2 IMPLANT
GOWN STRL REUS W/ TWL LRG LVL3 (GOWN DISPOSABLE) ×3 IMPLANT
GOWN STRL REUS W/ TWL XL LVL3 (GOWN DISPOSABLE) ×1 IMPLANT
GOWN STRL REUS W/TWL LRG LVL3 (GOWN DISPOSABLE) ×3
GOWN STRL REUS W/TWL XL LVL3 (GOWN DISPOSABLE) ×1
KIT MARKER MARGIN INK (KITS) ×2 IMPLANT
LIQUID BAND (GAUZE/BANDAGES/DRESSINGS) ×2 IMPLANT
NDL SAFETY ECLIPSE 18X1.5 (NEEDLE) ×1 IMPLANT
NEEDLE HYPO 18GX1.5 SHARP (NEEDLE) ×1
NEEDLE HYPO 25X1 1.5 SAFETY (NEEDLE) ×4 IMPLANT
NS IRRIG 1000ML POUR BTL (IV SOLUTION) ×2 IMPLANT
PACK BASIN DAY SURGERY FS (CUSTOM PROCEDURE TRAY) ×2 IMPLANT
PENCIL BUTTON HOLSTER BLD 10FT (ELECTRODE) ×2 IMPLANT
SLEEVE SCD COMPRESS KNEE MED (MISCELLANEOUS) ×2 IMPLANT
SPONGE LAP 4X18 X RAY DECT (DISPOSABLE) ×4 IMPLANT
SUT MNCRL AB 4-0 PS2 18 (SUTURE) ×2 IMPLANT
SUT SILK 2 0 SH (SUTURE) IMPLANT
SUT VICRYL 3-0 CR8 SH (SUTURE) ×2 IMPLANT
SYR CONTROL 10ML LL (SYRINGE) ×4 IMPLANT
TOWEL OR 17X24 6PK STRL BLUE (TOWEL DISPOSABLE) ×2 IMPLANT
TOWEL OR NON WOVEN STRL DISP B (DISPOSABLE) ×2 IMPLANT
TUBE CONNECTING 20X1/4 (TUBING) IMPLANT
YANKAUER SUCT BULB TIP NO VENT (SUCTIONS) IMPLANT

## 2014-11-02 NOTE — Discharge Instructions (Signed)
[place] Office Phone Number [phone]  BREAST BIOPSY/ PARTIAL MASTECTOMY: POST OP INSTRUCTIONS  Always review your discharge instruction sheet given to you by the facility where your surgery was performed.  IF YOU HAVE DISABILITY OR FAMILY LEAVE FORMS, YOU MUST BRING THEM TO THE OFFICE FOR PROCESSING.  DO NOT GIVE THEM TO YOUR DOCTOR.  1. A prescription for pain medication may be given to you upon discharge.  Take your pain medication as prescribed, if needed.  If narcotic pain medicine is not needed, then you may take acetaminophen (Tylenol) or ibuprofen (Advil) as needed. 2. Take your usually prescribed medications unless otherwise directed 3. If you need a refill on your pain medication, please contact your pharmacy.  They will contact our office to request authorization.  Prescriptions will not be filled after 5pm or on week-ends. 4. You should eat very light the first 24 hours after surgery, such as soup, crackers, pudding, etc.  Resume your normal diet the day after surgery. 5. Most patients will experience some swelling and bruising in the breast.  Ice packs and a good support bra will help.  Swelling and bruising can take several days to resolve.  6. It is common to experience some constipation if taking pain medication after surgery.  Increasing fluid intake and taking a stool softener will usually help or prevent this problem from occurring.  A mild laxative (Milk of Magnesia or Miralax) should be taken according to package directions if there are no bowel movements after 48 hours. 7. Unless discharge instructions indicate otherwise, you may remove your bandages 24-48 hours after surgery, and you may shower at that time.  You may have steri-strips (small skin tapes) in place directly over the incision.  These strips should be left on the skin for 7-10 days.  If your surgeon used skin glue on the incision, you may shower in 24 hours.  The glue will flake off over the  next 2-3 weeks.  Any sutures or staples will be removed at the office during your follow-up visit. 8. ACTIVITIES:  You may resume regular daily activities (gradually increasing) beginning the next day.  Wearing a good support bra or sports bra minimizes pain and swelling.  You may have sexual intercourse when it is comfortable. a. You may drive when you no longer are taking prescription pain medication, you can comfortably wear a seatbelt, and you can safely maneuver your car and apply brakes. b. RETURN TO WORK:  ______________________________________________________________________________________ 9. You should see your doctor in the office for a follow-up appointment approximately two weeks after your surgery.  Your doctors nurse will typically make your follow-up appointment when she calls you with your pathology report.  Expect your pathology report 2-3 business days after your surgery.  You may call to check if you do not hear from [place] after three days. 10. OTHER INSTRUCTIONS: _______________________________________________________________________________________________ _____________________________________________________________________________________________________________________________________ _____________________________________________________________________________________________________________________________________ _____________________________________________________________________________________________________________________________________  WHEN TO CALL YOUR DOCTOR: 1. Fever over 101.0 2. Nausea and/or vomiting. 3. Extreme swelling or bruising. 4. Continued bleeding from incision. 5. Increased pain, redness, or drainage from the incision.  The clinic staff is available to answer your questions during regular business hours.  Please dont hesitate to call and ask to speak to one of the nurses for clinical concerns.  If you have a medical emergency, go to the nearest  emergency room or call 911.  A surgeon from [facility] is always on call at the hospital.  For further questions, please visit [email or web address] °Office Phone Number [phone] ° °BREAST BIOPSY/ PARTIAL MASTECTOMY: POST OP INSTRUCTIONS ° °Always review your discharge instruction sheet given to you by the facility where your surgery was performed. ° °IF YOU HAVE DISABILITY OR FAMILY LEAVE FORMS, YOU MUST BRING THEM TO THE OFFICE FOR PROCESSING.  DO NOT GIVE THEM TO YOUR DOCTOR. ° °11. A prescription for pain medication may be given to you upon discharge.  Take your pain medication as prescribed, if needed.  If narcotic pain medicine is not needed, then you may take acetaminophen (Tylenol) or ibuprofen (Advil) as needed. °12. Take your usually prescribed medications unless otherwise directed °13. If you need a refill on your pain medication, please contact your pharmacy.  They will contact our office to request authorization.  Prescriptions will not be filled after 5pm or on week-ends. °14. You should eat very light the first 24 hours after surgery, such as soup, crackers, pudding, etc.  Resume your normal diet the day after surgery. °15. Most patients will experience some swelling and bruising in the breast.  Ice packs and a good support bra will help.  Swelling and bruising can take several days to resolve.  °16. It is common to experience some constipation if taking pain medication after surgery.  Increasing fluid intake and taking a stool softener will usually help or prevent this problem from occurring.  A mild laxative (Milk of Magnesia or Miralax) should be taken according to package directions if there are no bowel movements after 48 hours. °17. Unless discharge instructions indicate otherwise, you may remove your bandages 24-48 hours after surgery, and you may shower at that time.  You may have steri-strips (small skin tapes) in place directly over the  incision.  These strips should be left on the skin for 7-10 days.  If your surgeon used skin glue on the incision, you may shower in 24 hours.  The glue will flake off over the next 2-3 weeks.  Any sutures or staples will be removed at the office during your follow-up visit. °18. ACTIVITIES:  You may resume regular daily activities (gradually increasing) beginning the next day.  Wearing a good support bra or sports bra minimizes pain and swelling.  You may have sexual intercourse when it is comfortable. °a. You may drive when you no longer are taking prescription pain medication, you can comfortably wear a seatbelt, and you can safely maneuver your car and apply brakes. °b. RETURN TO WORK:  ______________________________________________________________________________________ °19. You should see your doctor in the office for a follow-up appointment approximately two weeks after your surgery.  Your doctor’s nurse will typically make your follow-up appointment when she calls you with your pathology report.  Expect your pathology report 2-3 business days after your surgery.  You may call to check if you do not hear from us after three days. °20. OTHER INSTRUCTIONS: _______________________________________________________________________________________________ _____________________________________________________________________________________________________________________________________ °_____________________________________________________________________________________________________________________________________ °_____________________________________________________________________________________________________________________________________ ° °WHEN TO CALL YOUR DOCTOR: °6. Fever over 101.0 °7. Nausea and/or vomiting. °8. Extreme swelling or bruising. °9. Continued bleeding from incision. °10. Increased pain, redness, or drainage from the incision. ° °The clinic staff is available to answer your questions  during regular business hours.  Please don’t hesitate to call and ask to speak to one of the nurses for clinical concerns.  If you have a medical emergency, go to the nearest emergency room or call 911.  A surgeon from [facility] is always on call at the hospital. ° °For further questions, please visit [email or web address]  ° ° °  Post Anesthesia Home Care Instructions ° °Activity: °Get plenty of rest for the remainder of the day. A responsible adult should stay with you for 24 hours following the procedure.  °For the next 24 hours, DO NOT: °-Drive a car °-Operate machinery °-Drink alcoholic beverages °-Take any medication unless instructed by your physician °-Make any legal decisions or sign important papers. ° °Meals: °Start with liquid foods such as gelatin or soup. Progress to regular foods as tolerated. Avoid greasy, spicy, heavy foods. If nausea and/or vomiting occur, drink only clear liquids until the nausea and/or vomiting subsides. Call your physician if vomiting continues. ° °Special Instructions/Symptoms: °Your throat may feel dry or sore from the anesthesia or the breathing tube placed in your throat during surgery. If this causes discomfort, gargle with warm salt water. The discomfort should disappear within 24 hours. ° °If you had a scopolamine patch placed behind your ear for the management of post- operative nausea and/or vomiting: ° °1. The medication in the patch is effective for 72 hours, after which it should be removed.  Wrap patch in a tissue and discard in the trash. Wash hands thoroughly with soap and water. °2. You may remove the patch earlier than 72 hours if you experience unpleasant side effects which may include dry mouth, dizziness or visual disturbances. °3. Avoid touching the patch. Wash your hands with soap and water after contact with the patch. °  ° °

## 2014-11-02 NOTE — Anesthesia Procedure Notes (Addendum)
Anesthesia Regional Block:  Pectoralis block  Pre-Anesthetic Checklist: ,, timeout performed, Correct Patient, Correct Site, Correct Laterality, Correct Procedure, Correct Position, site marked, Risks and benefits discussed,  Surgical consent,  Pre-op evaluation,  At surgeon's request and post-op pain management  Laterality: Right  Prep: chloraprep       Needles:  Injection technique: Single-shot  Needle Type: Echogenic Needle     Needle Length: 9cm 9 cm Needle Gauge: 21 and 21 G    Additional Needles:  Procedures: ultrasound guided (picture in chart) Pectoralis block Narrative:  Start time: 11/02/2014 10:10 AM End time: 11/02/2014 10:20 AM Injection made incrementally with aspirations every 5 mL.  Performed by: Personally  Anesthesiologist: HODIERNE, ADAM  Additional Notes: Pt tolerated the procedure well.   Procedure Name: LMA Insertion Date/Time: 11/02/2014 10:44 AM Performed by: Lyndee Leo Pre-anesthesia Checklist: Patient identified, Emergency Drugs available, Suction available and Patient being monitored Patient Re-evaluated:Patient Re-evaluated prior to inductionOxygen Delivery Method: Circle System Utilized Preoxygenation: Pre-oxygenation with 100% oxygen Intubation Type: IV induction Ventilation: Mask ventilation without difficulty LMA: LMA inserted LMA Size: 4.0 Number of attempts: 1 Airway Equipment and Method: Bite block Placement Confirmation: positive ETCO2 Tube secured with: Tape Dental Injury: Teeth and Oropharynx as per pre-operative assessment

## 2014-11-02 NOTE — Progress Notes (Signed)
  Assisted Dr. Hodierne with right, ultrasound guided, pectoralis block. Side rails up, monitors on throughout procedure. See vital signs in flow sheet. Tolerated Procedure well. 

## 2014-11-02 NOTE — Interval H&P Note (Signed)
History and Physical Interval Note:  11/02/2014 10:32 AM  [name] [name]  has presented today for surgery, with the diagnosis of Right breast cancer  The various methods of treatment have been discussed with the patient and family. After consideration of risks, benefits and other options for treatment, the patient has consented to  Procedure(s): RIGHT BREAST LUMPECTOMY WITH RADIOACTIVE SEED AND RIGHT AXILLARY SENTINEL LYMPH NODE BIOPSY (Right) as a surgical intervention .  The patient's history has been reviewed, patient examined, no change in status, stable for surgery.  I have reviewed the patient's chart and labs.  Questions were answered to the patient's satisfaction.     Mariane Burpee T

## 2014-11-02 NOTE — Op Note (Signed)
Preoperative Diagnosis: right breast cancer  Postoprative Diagnosis: right breast cancer  Procedure: Procedure(s): Blue dye injection right breast,RIGHT BREAST LUMPECTOMY WITH RADIOACTIVE SEED AND RIGHT AXILLARY SENTINEL LYMPH NODE BIOPSY   Surgeon: Excell Seltzer T   Assistants: none  Anesthesia:  General LMA anesthesia  Indications: patient has a recent diagnosis of stage IA invasive ductal carcinoma, ER/PR positive with a 1.4 cm primary in the upper outer right breast. After extensive preoperative workup and discussion detailed elsewhere we have elected to proceed with right breast lumpectomy and right axillary sentinel lymph node biopsy as initial surgical treatment.    Procedure Detail: following placement of a radioactive localizing seed at the tumor site yesterday and following injection of 1 mCi of technetium sulfur colloid intradermally around the right nipple in the holding area and following regional block by anesthesia in the holding area and after confirming seed placement in the holding area the patient is brought to the operating room, placed in the supine position on the operating table and laryngeal mask general anesthesia induced. Under sterile technique after patient timeout I injected 5 mL of dilute methylene blue soaked previously beneath the right nipple and massaged this for several minutes. The entire breast, right chest axilla and upper arm were widely sterilely prepped and draped. Patient timeout was performed and correct procedure verified. She received preoperative IV antibiotics. The neoprobe was used to localize the seed in the upper outer right breast. A curvilinear incision was made near the areolar border and dissection carried down through the subcutaneous tissue toward the breast capsule. A short skin and subcutaneous flaps were raised. Using the neoprobe for guidance dissection was deepened down around the area of increased counts and as the dissection  progressed and was able to feel an appropriate sized discrete mass within breast tissue was fairly dense. Staying away from the mass and using the neoprobe for guidance and approximately 2-1/2-3 cm specimen of breast tissue was excised around the area of high counts and removed. The seed was confirmed within the specimen with the neoprobe. The specimen was inked for margins. Specimen mammography showed the seed and the marking clip within the specimen. These were slightly eccentrically oriented seed being a little medial and the clip a little superior and the specimen but the mass had felt central with soft tissue in all directions. This was sent for permanent pathology. Hemostasis was obtained with cautery and the wound irrigated. The lumpectomy cavity was marked with clips. The breast is obtained this tissue was closed with interrupted 3-0 Vicryl. Following this attention was turned to the sentinel lymph node biopsy. A hot area in the right axilla was localized and a small transverse incision made. Dissection was carried down through the subcutaneous tissue with cautery and the clavipectoral fascia incised. I was able to feel a small slightly firm node and using the neoprobe it was dissected and found to have very high counts and blue dye. This was excised and ex vivo had counts in excess of 700. This was sent as hot blue axillary sentinel lymph node #1. More anteriorly a smaller node with slight blue dye was localized with the neoprobe and excised and had counts of about 100 and was sent as hot blue sentinel lymph node #2. Following this another area slightly increased counts was localized along the pectoral border and excised and had counts of about 70% as hot blue sentinel lymph node #3 but may have just been fatty tissue. There were still counts of about 250  and more superiorly in the axilla I found a very small blue node with elevated counts that was excised and ex vivo had counts of 270 and was sent as hot  blue axillary sentinel lymph node #4. Background in the axilla at this point was essentially 0 and was no palpable abnormalities. The deep and subcutaneous tissue was closed with interrupted 3-0 Vicryl. Both skin incisions were closed with subcuticular 4-0 Monocryl. A rebound was applied. Sponge needle and instrument counts were correct.   Findings: As above  Estimated Blood Loss:  Minimal         Drains: none  Blood Given: none          Specimens: #1 right breast lumpectomy   #2 through 5-right axillary sentinel lymph nodes 4        Complications:  * No complications entered in OR log *         Disposition: PACU - hemodynamically stable.         Condition: stable

## 2014-11-02 NOTE — Anesthesia Preprocedure Evaluation (Signed)
Anesthesia Evaluation  Patient identified by MRN, date of birth, ID band Patient awake    Reviewed: Allergy & Precautions, NPO status , Patient's Chart, lab work & pertinent test results  Airway Mallampati: II   Neck ROM: full    Dental   Pulmonary former smoker,  breath sounds clear to auscultation        Cardiovascular + dysrhythmias Supra Ventricular Tachycardia Rhythm:regular Rate:Normal  S/p EP ablation.   Neuro/Psych    GI/Hepatic   Endo/Other    Renal/GU      Musculoskeletal   Abdominal   Peds  Hematology   Anesthesia Other Findings   Reproductive/Obstetrics Breast CA                             Anesthesia Physical Anesthesia Plan  ASA: II  Anesthesia Plan: General and Regional   Post-op Pain Management:    Induction: Intravenous  Airway Management Planned: LMA  Additional Equipment:   Intra-op Plan:   Post-operative Plan:   Informed Consent: I have reviewed the patients History and Physical, chart, labs and discussed the procedure including the risks, benefits and alternatives for the proposed anesthesia with the patient or authorized representative who has indicated his/her understanding and acceptance.     Plan Discussed with: CRNA, Anesthesiologist and Surgeon  Anesthesia Plan Comments:         Anesthesia Quick Evaluation

## 2014-11-02 NOTE — H&P (Signed)
History of Present Illness [name] [name] T. Chaise Mahabir [name]; 10/07/2014 10:38 AM) Patient words: Right Breast Cancer.  The patient is a 59 year old female who presents with breast cancer. [name] [name] is a 59 YO post menopausal female referred by Dr. Marcelo Baldy for evaluation of recently diagnosed carcinoma of the right breast. She recently presented for a screening mamogram revealing a new area of distortion or mass in the upper outer right breast. Subsequent imaging included diagnostic mamogram showing a mass at the 11:30 position of the right breast and ultrasound showing a 1.4 cm lobulated mass in the right breast at the 11:30 o'clock position 5 cm from the nipple. An ultrasound guided breast biopsy was performed on Sep 30, 2014 with pathology revealing invasive ductal carcinoma of the breast. She is seen now in the office for initial treatment planning. She has experienced no breast symptoms, specifically lump or skin changes or nipple inversion or pain. She does not have a personal history of any previous breast problems.  Findings at that time were the following: Tumor size: 1.4 cm Tumor grade: 1, Ki-67 5% Estrogen Receptor: Positive Progesterone Receptor: Positive Her-2 neu: Negative Lymph node status: Negative    Other Problems [name] [name], CMA; 10/07/2014 9:58 AM) Back Pain Breast Cancer Inguinal Hernia Lump In Breast  Past Surgical History [name] [name], [place]; 10/07/2014 9:58 AM) Breast Biopsy Right. Hysterectomy (not due to cancer) - Partial Knee Surgery Right.  Diagnostic Studies History [name] [name], [place]; 10/07/2014 9:58 AM) Colonoscopy 5-10 years ago Mammogram within last year Pap Smear 1-5 years ago  Allergies [name] [name], [place]; 10/07/2014 10:00 AM) No Known Drug Allergies05/04/2015  Social History [name] [name], [place]; 10/07/2014 9:58 AM) Alcohol use Moderate alcohol use. Caffeine use Coffee, Tea. No drug use Tobacco use Former smoker.  Family History  [name] [name], [place]; 10/07/2014 9:58 AM) Breast Cancer Family Members In General. Cancer Father. Depression Mother.  Pregnancy / Birth History [name] [name], [place]; 10/07/2014 9:58 AM) Age at menarche 45 years. Age of menopause 44-50 Gravida 2 Irregular periods Maternal age 16-30 Para 2  Review of Systems [name] [name] CMA; 10/07/2014 9:58 AM) General Not Present- Appetite Loss, Chills, Fatigue, Fever, Night Sweats, Weight Gain and Weight Loss. Skin Not Present- Change in Wart/Mole, Dryness, Hives, Jaundice, New Lesions, Non-Healing Wounds, Rash and Ulcer. HEENT Present- Hearing Loss and Seasonal Allergies. Not Present- Earache, Hoarseness, Nose Bleed, Oral Ulcers, Ringing in the Ears, Sinus Pain, Sore Throat, Visual Disturbances, Wears glasses/contact lenses and Yellow Eyes. Respiratory Present- Snoring. Not Present- Bloody sputum, Chronic Cough, Difficulty Breathing and Wheezing. Breast Present- Breast Mass. Not Present- Breast Pain, Nipple Discharge and Skin Changes. Cardiovascular Not Present- Chest Pain, Difficulty Breathing Lying Down, Leg Cramps, Palpitations, Rapid Heart Rate, Shortness of Breath and Swelling of Extremities. Gastrointestinal Not Present- Abdominal Pain, Bloating, Bloody Stool, Change in Bowel Habits, Chronic diarrhea, Constipation, Difficulty Swallowing, Excessive gas, Gets full quickly at meals, Hemorrhoids, Indigestion, Nausea, Rectal Pain and Vomiting. Female Genitourinary Not Present- Frequency, Nocturia, Painful Urination, Pelvic Pain and Urgency. Musculoskeletal Present- Back Pain. Not Present- Joint Pain, Joint Stiffness, Muscle Pain, Muscle Weakness and Swelling of Extremities. Neurological Not Present- Decreased Memory, Fainting, Headaches, Numbness, Seizures, Tingling, Tremor, Trouble walking and Weakness. Psychiatric Not Present- Anxiety, Bipolar, Change in Sleep Pattern, Depression, Fearful and Frequent crying. Endocrine Not Present- Cold  Intolerance, Excessive Hunger, Hair Changes, Heat Intolerance, Hot flashes and New Diabetes. Hematology Not Present- Easy Bruising, Excessive bleeding, Gland problems, HIV and Persistent Infections.   Vitals [name] [name] CMA; 10/07/2014 9:58  AM) 10/07/2014 9:58 AM Weight: 172.4 lb Height: 69in Body Surface Area: 1.95 m Body Mass Index: 25.46 kg/m Temp.: 98.71F(Temporal)  Pulse: 72 (Regular)  Resp.: 16 (Unlabored)  BP: 116/76 (Sitting, Left Arm, Standard)    Physical Exam [name] [name] T. [name] [name]; 10/07/2014 10:39 AM) The physical exam findings are as follows: Note:General: Alert, well-developed and well nourished Caucasian female, in no distress Skin: Warm and dry without rash or infection. HEENT: No palpable masses or thyromegaly. Sclera nonicteric. Pupils equal round and reactive. Oropharynx clear. Lymph nodes: No cervical, supraclavicular, or inguinal nodes palpable. Breasts: There is some bruising and approximately 1-1/2 cm palpable mass in the upper outer right breast, possibly related to postbiopsy change Lungs: Breath sounds clear and equal. No wheezing or increased work of breathing. Cardiovascular: Regular rate and rhythm without murmer. No JVD or edema. Peripheral pulses intact. No carotid bruits. Abdomen: Nondistended. Soft and nontender. No masses palpable. No organomegaly. No palpable hernias. Extremities: No edema or joint swelling or deformity. No chronic venous stasis changes. Neurologic: Alert and fully oriented. Gait normal. No focal weakness. Psychiatric: Normal mood and affect. Thought content appropriate with normal judgement and insight    Assessment & Plan [name] [name] T. Denisia Harpole [name]; 10/07/2014 10:41 AM) MALIGNANT NEOPLASM OF RIGHT BREAST, STAGE 1 (174.9  C50.911) Impression: 59 year old female with a new diagnosis of cancer of the right breast, upper outer quadrant. Clinical stage Ia, ER positive, PR positive, HER-2 negative. I discussed with the  patient and family members present today initial surgical treatment options. We discussed options of breast conservation with lumpectomy or total mastectomy and sentinal lymph node biopsy/dissection. Options for reconstruction were discussed. After discussion they have elected to proceed with breast conservation with radioactive seed localized lumpectomy and axillary sentinel lymph node biopsy. We discussed the indications and nature of the procedure, and expected recovery, in detail. Surgical risks including anesthetic complications, cardiorespiratory complications, bleeding, infection, wound healing complications, blood clots, lymphedema, local and distant recurrence and possible need for further surgery based on the final pathology was discussed and understood. Chemotherapy, hormonal therapy and radiation therapy have been discussed. They have been provided with literature regarding the treatment of breast cancer. All questions were answered. They understand and agree to proceed and we will go ahead with scheduling. Current Plans  Schedule for Surgery Radioactive seed localized right breast lumpectomy and right axillary sentinel lymph node biopsy as an outpatient Referred to Oncology, for evaluation and follow up (Oncology). Referred to Radiation Oncology, for evaluation and follow up (Radiation Oncology).

## 2014-11-02 NOTE — Transfer of Care (Signed)
Immediate Anesthesia Transfer of Care Note  Patient: [name] [name]  Procedure(s) Performed: Procedure(s): RIGHT BREAST LUMPECTOMY WITH RADIOACTIVE SEED AND RIGHT AXILLARY SENTINEL LYMPH NODE BIOPSY (Right)  Patient Location: PACU  Anesthesia Type:GA combined with regional for post-op pain  Level of Consciousness: awake, alert , sedated and patient cooperative  Airway & Oxygen Therapy: Patient Spontanous Breathing and Patient connected to face mask oxygen  Post-op Assessment: Report given to RN and Post -op Vital signs reviewed and stable  Post vital signs: Reviewed and stable  Last Vitals:  Filed Vitals:   11/02/14 1020  BP:   Pulse: 79  Temp:   Resp: 14    Complications: No apparent anesthesia complications

## 2014-11-02 NOTE — Anesthesia Postprocedure Evaluation (Signed)
Anesthesia Post Note  Patient: [name] [name]  Procedure(s) Performed: Procedure(s) (LRB): RIGHT BREAST LUMPECTOMY WITH RADIOACTIVE SEED AND RIGHT AXILLARY SENTINEL LYMPH NODE BIOPSY (Right)  Anesthesia type: General  Patient location: PACU  Post pain: Pain level controlled and Adequate analgesia  Post assessment: Post-op Vital signs reviewed, Patient's Cardiovascular Status Stable, Respiratory Function Stable, Patent Airway and Pain level controlled  Last Vitals:  Filed Vitals:   11/02/14 1205  BP:   Pulse:   Temp: 36.4 C  Resp:     Post vital signs: Reviewed and stable  Level of consciousness: awake, alert  and oriented  Complications: No apparent anesthesia complications

## 2014-11-04 ENCOUNTER — Encounter (HOSPITAL_BASED_OUTPATIENT_CLINIC_OR_DEPARTMENT_OTHER): Payer: Self-pay | Admitting: General Surgery

## 2014-11-04 ENCOUNTER — Telehealth: Payer: Self-pay | Admitting: *Deleted

## 2014-11-04 NOTE — Telephone Encounter (Signed)
Received order for oncotype testing. Requisition sent to pathology. Received by Tammy 

## 2014-11-08 ENCOUNTER — Telehealth: Payer: Self-pay | Admitting: *Deleted

## 2014-11-08 NOTE — Telephone Encounter (Signed)
R/s pt appt with Dr. Lindi Adie to 11/22/14 at 8:45 to discuss oncotype results.

## 2014-11-11 ENCOUNTER — Encounter (HOSPITAL_COMMUNITY): Payer: Self-pay

## 2014-11-12 ENCOUNTER — Telehealth: Payer: Self-pay | Admitting: *Deleted

## 2014-11-12 NOTE — Telephone Encounter (Signed)
Received oncotype score of 12/8%. Copy placed on Dr. Geralyn Flash desk. Original to HIM for scanning

## 2014-11-16 ENCOUNTER — Telehealth: Payer: Self-pay | Admitting: *Deleted

## 2014-11-16 NOTE — Telephone Encounter (Signed)
Spoke with patient and informed her of oncotype results. She will not nee chemo.  Informed her I would let radiation know that she needs a follow up appointment with Dr. Pablo Ledger.  She would like to cancel her appointment with Dr. Lindi Adie and see him after radiation.  Informed her I would cancel it for her.

## 2014-11-17 ENCOUNTER — Ambulatory Visit: Payer: 59 | Admitting: Radiation Oncology

## 2014-11-17 ENCOUNTER — Ambulatory Visit: Payer: 59

## 2014-11-17 ENCOUNTER — Ambulatory Visit: Payer: 59 | Admitting: Hematology and Oncology

## 2014-11-22 ENCOUNTER — Ambulatory Visit: Payer: 59 | Admitting: Hematology and Oncology

## 2014-11-23 ENCOUNTER — Encounter: Payer: Self-pay | Admitting: Radiation Oncology

## 2014-11-23 NOTE — Progress Notes (Signed)
Location of Breast Cancer: Right Breast, Upper Outer Quadrant  Histology per Pathology Report: Right Breast 11/02/14 Diagnosis 1. Breast, lumpectomy, right - INVASIVE DUCTAL CARCINOMA WITH EXTRACELLULAR MUCIN, GRADE 2/3, SPANNING 1.8 CM. - INVASIVE CARCINOMA IS BROADLY 0.1 CM TO THE POSTERIOR MARGIN. - SEE ONCOLOGY TABLE BELOW. 2. Lymph node, sentinel, biopsy, right axillary #1 - THERE IS A NO EVIDENCE OF CARCINOMA IN 1 OF 1 LYMPH NODE (0/1). 3. Lymph node, sentinel, biopsy, right axillary #2 - THERE IS A NO EVIDENCE OF CARCINOMA IN 1 OF 1 LYMPH NODE (0/1). 1 of 3 FINAL for [name], [name] [phone]) Diagnosis(continued) 4. Lymph node, sentinel, biopsy, right axillary #3 - BENIGN FIBROADIPOSE TISSUE. - LYMPH NODAL TISSUE IS NOT IDENTIFIED. 5. Lymph node, sentinel, biopsy, right axillary #4 - THERE IS NO EVIDENCE OF CARCINOMA IN 1 OF 1 LYMPH NODE (0/1).  09/30/14 Diagnosis Breast, right, needle core biopsy - INVASIVE DUCTAL CARCINOMA WITH ABUNDANT EXTRACELLULAR MUCIN  Receptor Status: ER(98%), PR (63%), Her2-neu (Neg), Ki-67(7%) Oncotype Dx -12  Ms. [name] [name] presented for a screening mamogram revealing a new area of distortion or mass in the upper outer right breast. Subsequent imaging included diagnostic mamogram showing a mass at the 11:30 position of the right breast and ultrasound showing a 1.4 cm lobulated mass in the right breast at the 11:30 o'clock position 5 cm from the nipple. An ultrasound guided breast biopsy was performed on Sep 30, 2014 with pathology revealing invasive ductal carcinoma of the breast.  Past/Anticipated interventions by surgeon, if any: Dr. Fredonia Highland: Biopsy of the right breast  Past/Anticipated interventions by medical oncology, if any: Dr. Nicholas Lose: Adjuvant radiation therapy followed by Adjuvant antiestrogen therapy.  Oncotype Dx -12  Lymphedema issues, if any: NO  Pain issues, if any:    SAFETY ISSUES:  Prior radiation?  No  Pacemaker/ICD? No  Possible current pregnancy?No  Is the patient on methotrexate? No  Current Complaints / other details:  Cellulitis in breast post surgery - antibiotics x 7 days starting June 10th x 1 week.  Note mall open area in right axillary incision.  Area actual dehisced and drained.  No drainage noted today.  She menarched at early age of 70 and went to menopause at age 20  She had 2 pregnancy, her first child was born at age 40  She has received birth control pills for approximately 5 years. Hysterectomy 1999  She was never exposed to fertility medications or hormone replacement therapy.   She has family history of Breast/GYN/GI cancer Father age 77 bladder cancer, cousin age 80 breast cancer; 2 aunts and 27s with lung cancer one aunt in 5s with lung cancer and another aunt in 35s with ovarian cancer      [name] [name], [name] Curb, RN 11/23/2014,6:40 PM

## 2014-11-25 ENCOUNTER — Ambulatory Visit
Admission: RE | Admit: 2014-11-25 | Discharge: 2014-11-25 | Disposition: A | Discharge status: Home / Self Care | Payer: 59 | Source: Ambulatory Visit | Attending: Radiation Oncology | Admitting: Radiation Oncology

## 2014-11-25 ENCOUNTER — Encounter: Payer: Self-pay | Admitting: Radiation Oncology

## 2014-11-25 VITALS — BP 115/71 | HR 78 | Temp 98.1°F | Ht 69.0 in | Wt 175.8 lb

## 2014-11-25 DIAGNOSIS — C50911 Malignant neoplasm of unspecified site of right female breast: Secondary | ICD-10-CM | POA: Insufficient documentation

## 2014-11-25 DIAGNOSIS — C50411 Malignant neoplasm of upper-outer quadrant of right female breast: Secondary | ICD-10-CM

## 2014-11-25 DIAGNOSIS — Z510 Encounter for antineoplastic radiation therapy: Secondary | ICD-10-CM | POA: Insufficient documentation

## 2014-11-25 DIAGNOSIS — Z170 Estrogen receptor positive status [ER+]: Secondary | ICD-10-CM | POA: Diagnosis not present

## 2014-11-25 HISTORY — DX: Malignant neoplasm of unspecified site of right female breast: C50.911

## 2014-11-25 NOTE — Progress Notes (Signed)
[facility] NEW PATIENT EVALUATION  Name: [name] [name] MRN: [id number]  Date:   11/25/2014           DOB: Sep 07, 1955  Status: outpatient   CC: [name], MD  Nicholas Lose, MD , Dr. Adonis Housekeeper   REFERRING PHYSICIAN: Nicholas Lose, MD   DIAGNOSIS: Stage I A (T1 N0 M0) invasive ductal carcinoma of the right breast   HISTORY OF PRESENT ILLNESS:  [name] [name] is a 59 y.o. female who is seen today through the courtesy of Dr. Lindi Adie and Dr. Excell Seltzer for consideration of radiation therapy following conservative surgery in the management of her T1 N0 invasive ductal carcinoma the right breast.  At the time of a screening mammogram at [facility] on 09/22/2014 she was felt to have a possible mass within the right breast.  Additional views and ultrasound showed a 1.4 cm mass at 11:00, 5 cm from the nipple.  Biopsy on 09/30/2014 was diagnostic for invasive ductal carcinoma.  The tumor was ER positive at 98%, PR positive at 63% with a low proliferation marker of 7%.  HER-2/neu was not amplified.  Dr. Excell Seltzer performed a right partial mastectomy and sentinel lymph node biopsy on 11/02/2014.  She was found to have a 1.8 cm grade 2/3 invasive ductal carcinoma with extra cellular mucin.  The invasive carcinoma was broadly 0.1 cm to the posterior margin.  3 sentinel lymph nodes were free of metastatic disease.  Postoperatively, she did have induration and erythema perhaps representing early cellulitis along the central breast and she was placed on Keflex for 7 days.  Dr. Lindi Adie obtained Oncotype DX testing and her score is 12 predicting a 10 year risk for distant recurrence of 8% with tamoxifen alone.  Therefore, she is seen for upfront radiation therapy followed by antiestrogen therapy.  She states that her right breast erythema is slowly improving.  PREVIOUS RADIATION THERAPY: No   PAST MEDICAL HISTORY:  has a past medical history of Paroxysmal SVT (supraventricular  tachycardia) (2004); Cancer; Dysrhythmia; and Cancer of right breast.     PAST SURGICAL HISTORY:  Past Surgical History  Procedure Laterality Date  . Stapedectomy Right   . Abdominal hysterectomy  1999    WITH BLADDER REPAIR FOR PROPLAPSE-HAS OVARIES (NO CA)  . Svt ablation  2000  . Knee cartilage surgery    . Breast lumpectomy with radioactive seed and sentinel lymph node biopsy Right 11/02/2014    Procedure: RIGHT BREAST LUMPECTOMY WITH RADIOACTIVE SEED AND RIGHT AXILLARY SENTINEL LYMPH NODE BIOPSY;  Surgeon: Excell Seltzer, MD;  Location: [place];  Service: General;  Laterality: Right;     FAMILY HISTORY: family history includes Bladder Cancer in her father; CVA in her maternal grandmother and mother; Heart attack in her maternal grandfather; Leukemia in her paternal grandfather; Supraventricular tachycardia in her brother, grandchild, sister, and son.  Her father was diagnosed with bladder cancer many years ago.  He is alive and otherwise well at 85.  Her mother is alive and well at 56.  A paternal cousin was diagnosed with breast cancer at age 77.   SOCIAL HISTORY:  reports that she has quit smoking. She started smoking about 36 years ago. She does not have any smokeless tobacco history on file. She reports that she drinks alcohol. She reports that she does not use illicit drugs.  Married, 2 children.  She immigrated to [place] from [place] approximately 17 years ago.  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  ALLERGIES: Review of patient's allergies indicates no known allergies.   MEDICATIONS:  Current Outpatient Prescriptions  Medication Sig Dispense Refill  . Multiple Vitamin (MULTIVITAMIN) tablet Take 1 tablet by mouth daily.     No current facility-administered medications for this encounter.     REVIEW OF SYSTEMS:  Pertinent items are noted in HPI.    PHYSICAL EXAM:  height is _0  (1.753 m) and weight is 175 lb 12.8 oz (79.742 kg). Her  temperature is 98.1 F (36.7 C). Her blood pressure is 115/71 and her pulse is 78.   Alert and oriented 59 year old female appearing her stated age.  Head and neck examination: Grossly unremarkable.  Nodes: Without palpable cervical, supraclavicular, or axillary lymphadenopathy.  There is a 3-4 mm area of superficial granulating tissue along the anterior aspect of the right axillary sentinel lymph node biopsy wound.  Breasts: There is bilateral nipple inversion.  There is slight erythema along the central right breast.  There is a partial mastectomy periareolar wound extending from 9 to approximately 1:00.  The wound is healing well.  Extremities: Without edema.   LABORATORY DATA:  No results found for: WBC, HGB, HCT, MCV, PLT No results found for: NA, K, CL, CO2 No results found for: ALT, AST, GGT, ALKPHOS, BILITOT    IMPRESSION: Stage I A (T1 N0 M0) invasive ductal carcinoma of the right breast.  We discussed local management options which include mastectomy versus partial mastectomy followed by radiation therapy.  She will like to proceed with breast conservation.  We discussed hypofractionated treatment versus standard fractionation.  In view of her close margin, she should receive a boost.  There is no need for additional surgery/reexcision.  She would like to have hypofractionated treatment which I think would be appropriate.  We discussed the potential acute and late toxicities of radiation therapy.  Consent is signed today.  We will have her return in mid July for simulation/treatment planning.   PLAN: As discussed above.   I spent 60  minutes face to face with the patient and more than 50% of that time was spent in counseling and/or coordination of care.

## 2014-11-25 NOTE — Addendum Note (Signed)
Encounter addended by: Benn Moulder, RN on: 11/25/2014  5:26 PM<BR>     Documentation filed: Charges VN

## 2014-11-26 NOTE — Addendum Note (Signed)
Encounter addended by: Benn Moulder, RN on: 11/26/2014  8:10 PM<BR>     Documentation filed: Arn Medal VN

## 2014-12-08 ENCOUNTER — Ambulatory Visit: Payer: 59 | Admitting: Radiation Oncology

## 2014-12-08 ENCOUNTER — Ambulatory Visit: Payer: 59

## 2014-12-08 ENCOUNTER — Encounter: Payer: Self-pay | Admitting: *Deleted

## 2014-12-08 NOTE — Progress Notes (Addendum)
Ms. [name] [name] mother had a stroke, therefore, she is leaving tomorrow to fly to [place]. Her simulation has been changed to 12/20/14 at 0900 per patients' request.

## 2014-12-13 ENCOUNTER — Ambulatory Visit: Payer: 59 | Admitting: Radiation Oncology

## 2014-12-20 ENCOUNTER — Ambulatory Visit
Admission: RE | Admit: 2014-12-20 | Discharge: 2014-12-20 | Disposition: A | Discharge status: Home / Self Care | Payer: 59 | Source: Ambulatory Visit | Attending: Radiation Oncology | Admitting: Radiation Oncology

## 2014-12-20 DIAGNOSIS — C50911 Malignant neoplasm of unspecified site of right female breast: Secondary | ICD-10-CM | POA: Diagnosis not present

## 2014-12-20 DIAGNOSIS — C50411 Malignant neoplasm of upper-outer quadrant of right female breast: Secondary | ICD-10-CM

## 2014-12-20 NOTE — Progress Notes (Signed)
Complex simulation/treatment planning note: the patient was taken to the CT simulator.  A Vac lock immobilization device was constructed on a custom breast board. Her right breast borders were marked with radiopaque wires along with her partial mastectomy scar. She was then scanned. An isocenter was chosen. The CT data set was sent to the planning system where she was set up to medial and lateral right breast tangents with 2 unique MLCs. I'm prescribing  4250 cGy 17 sessions the  Right breast.  This be followed by electron beam boost for a further 1200 cGy 6 sessions with 12 MEV electrons. She is now ready for 3-D simulation.

## 2014-12-21 ENCOUNTER — Encounter: Payer: Self-pay | Admitting: Radiation Oncology

## 2014-12-21 ENCOUNTER — Other Ambulatory Visit: Payer: Self-pay

## 2014-12-21 DIAGNOSIS — C50911 Malignant neoplasm of unspecified site of right female breast: Secondary | ICD-10-CM | POA: Diagnosis not present

## 2014-12-21 NOTE — Progress Notes (Signed)
3-D simulation note: The patient completed 3-D simulation for treatment to her right breast.  Dose volume histograms were obtained for the target structures and also avoidance structures including her lungs and heart.  We met our departmental guidelines.  She was set up to medial and lateral right breast tangents with unique MLCs and electronic compensation for a total four complex treatment devices.  I am prescribing 4250 cGy 17 sessions utilizing 6 MV photons.  This be followed by a right breast boost for a further 1200 cGy in 6 sessions.

## 2014-12-22 ENCOUNTER — Telehealth: Payer: Self-pay | Admitting: Hematology and Oncology

## 2014-12-22 NOTE — Telephone Encounter (Signed)
Called and left a message with her follow up appointment °

## 2014-12-23 DIAGNOSIS — C50911 Malignant neoplasm of unspecified site of right female breast: Secondary | ICD-10-CM | POA: Diagnosis not present

## 2014-12-27 ENCOUNTER — Ambulatory Visit
Admission: RE | Admit: 2014-12-27 | Discharge: 2014-12-27 | Disposition: A | Discharge status: Home / Self Care | Payer: 59 | Source: Ambulatory Visit | Attending: Radiation Oncology | Admitting: Radiation Oncology

## 2014-12-27 DIAGNOSIS — C50911 Malignant neoplasm of unspecified site of right female breast: Secondary | ICD-10-CM | POA: Diagnosis not present

## 2014-12-28 ENCOUNTER — Encounter: Payer: Self-pay | Admitting: Radiation Oncology

## 2014-12-28 ENCOUNTER — Ambulatory Visit
Admission: RE | Admit: 2014-12-28 | Discharge: 2014-12-28 | Disposition: A | Discharge status: Home / Self Care | Payer: 59 | Source: Ambulatory Visit | Attending: Radiation Oncology | Admitting: Radiation Oncology

## 2014-12-28 VITALS — BP 124/81 | HR 69 | Temp 98.5°F | Ht 69.0 in | Wt 173.7 lb

## 2014-12-28 DIAGNOSIS — C50411 Malignant neoplasm of upper-outer quadrant of right female breast: Secondary | ICD-10-CM

## 2014-12-28 DIAGNOSIS — C50911 Malignant neoplasm of unspecified site of right female breast: Secondary | ICD-10-CM | POA: Diagnosis not present

## 2014-12-28 NOTE — Progress Notes (Signed)
[name] [name] has received 1 fractions ton her right breast.  No voiced concerns at this time.    Pt here for patient teaching.  Pt given Radiation and You booklet, Managing Acute Radiation Side Effects for Head and Neck Cancer handout, skin care instructions, Alra deodorant and Radiaplex gel. Pt reports they have not watched the Radiation Therapy Education video on   Reviewed areas of pertinence such as fatigue, skin changes, breast tenderness and breast swelling . Pt able to give teach back of to pat skin, use unscented/gentle soap and drink plenty of water,apply Radiaplex bid, avoid applying anything to skin within 4 hours of treatment, avoid wearing an under wire bra and to use an electric razor if they must shave. Pt demonstrated understanding of information given and will contact nursing with any questions or concerns.    Pt states they have not watched the Radiation Therapy Education.  On  video presented and watched. Http://rtanswers.org/treatmentinformation/whattoexpect/index

## 2014-12-28 NOTE — Progress Notes (Signed)
Weekly Management Note:  Site: Right breast Current Dose:  250  cGy Projected Dose: 4250  cGy followed by 6 fraction boost  Narrative: The patient is seen today for routine under treatment assessment. CBCT/MVCT images/port films were reviewed. The chart was reviewed.   She'll begin his radiation therapy today.  She underwent patient education.  No complaints today.  Physical Examination:  Filed Vitals:   12/28/14 1204  BP: 124/81  Pulse: 69  Temp: 98.5 F (36.9 C)  .  Weight: 173 lb 11.2 oz (78.79 kg).  No skin changes.  Impression: Tolerating radiation therapy well.  Plan: Continue radiation therapy as planned.

## 2014-12-29 ENCOUNTER — Ambulatory Visit
Admission: RE | Admit: 2014-12-29 | Discharge: 2014-12-29 | Disposition: A | Discharge status: Home / Self Care | Payer: 59 | Source: Ambulatory Visit | Attending: Radiation Oncology | Admitting: Radiation Oncology

## 2014-12-29 DIAGNOSIS — C50911 Malignant neoplasm of unspecified site of right female breast: Secondary | ICD-10-CM | POA: Diagnosis not present

## 2014-12-30 ENCOUNTER — Ambulatory Visit
Admission: RE | Admit: 2014-12-30 | Discharge: 2014-12-30 | Disposition: A | Discharge status: Home / Self Care | Payer: 59 | Source: Ambulatory Visit | Attending: Radiation Oncology | Admitting: Radiation Oncology

## 2014-12-30 DIAGNOSIS — C50911 Malignant neoplasm of unspecified site of right female breast: Secondary | ICD-10-CM | POA: Diagnosis not present

## 2014-12-31 ENCOUNTER — Encounter: Payer: Self-pay | Admitting: Radiation Oncology

## 2014-12-31 ENCOUNTER — Telehealth: Payer: Self-pay | Admitting: *Deleted

## 2014-12-31 ENCOUNTER — Ambulatory Visit
Admission: RE | Admit: 2014-12-31 | Discharge: 2014-12-31 | Disposition: A | Discharge status: Home / Self Care | Payer: 59 | Source: Ambulatory Visit | Attending: Radiation Oncology | Admitting: Radiation Oncology

## 2014-12-31 DIAGNOSIS — C50911 Malignant neoplasm of unspecified site of right female breast: Secondary | ICD-10-CM | POA: Diagnosis not present

## 2014-12-31 NOTE — Telephone Encounter (Signed)
Spoke with patient to follow up after starting radiation. She is doing well with no complaints.  Encouraged her to call with any needs or concerns.

## 2015-01-03 ENCOUNTER — Ambulatory Visit
Admission: RE | Admit: 2015-01-03 | Discharge: 2015-01-03 | Disposition: A | Discharge status: Home / Self Care | Payer: 59 | Source: Ambulatory Visit | Attending: Radiation Oncology | Admitting: Radiation Oncology

## 2015-01-03 ENCOUNTER — Encounter: Payer: Self-pay | Admitting: Radiation Oncology

## 2015-01-03 VITALS — BP 116/75 | HR 68 | Temp 98.0°F | Ht 69.0 in | Wt 173.2 lb

## 2015-01-03 DIAGNOSIS — C50911 Malignant neoplasm of unspecified site of right female breast: Secondary | ICD-10-CM | POA: Diagnosis not present

## 2015-01-03 DIAGNOSIS — C50411 Malignant neoplasm of upper-outer quadrant of right female breast: Secondary | ICD-10-CM

## 2015-01-03 NOTE — Progress Notes (Signed)
   Weekly Management Note:  outpatient    ICD-9-CM ICD-10-CM   1. Primary cancer of upper outer quadrant of right breast 174.4 C50.411     Current Dose:  12.5 Gy  Projected Dose: 54.5 Gy   Narrative:  The patient presents for routine under treatment assessment.  CBCT/MVCT images/Port film x-rays were reviewed.  The chart was checked. No complaints except axillary tenderness  Physical Findings:  height is 5\' 9"  (1.753 m) and weight is 173 lb 3.2 oz (78.563 kg). Her temperature is 98 F (36.7 C). Her blood pressure is 116/75 and her pulse is 68.   Wt Readings from Last 3 Encounters:  01/03/15 173 lb 3.2 oz (78.563 kg)  12/28/14 173 lb 11.2 oz (78.79 kg)  11/25/14 175 lb 12.8 oz (79.742 kg)   NAD, mildly erythematous right breast  Impression:  The patient is tolerating radiotherapy.  Plan:  Continue radiotherapy as planned.    ________________________________   Eppie Gibson, M.D.

## 2015-01-03 NOTE — Progress Notes (Addendum)
Ms. Siverson has received 5 fractions to her right breast.  She reports tenderness in the lower axillary region.  Skin unchanged at this time.

## 2015-01-04 ENCOUNTER — Ambulatory Visit
Admission: RE | Admit: 2015-01-04 | Discharge: 2015-01-04 | Disposition: A | Discharge status: Home / Self Care | Payer: 59 | Source: Ambulatory Visit | Attending: Radiation Oncology | Admitting: Radiation Oncology

## 2015-01-04 DIAGNOSIS — C50911 Malignant neoplasm of unspecified site of right female breast: Secondary | ICD-10-CM | POA: Diagnosis not present

## 2015-01-05 ENCOUNTER — Ambulatory Visit
Admission: RE | Admit: 2015-01-05 | Discharge: 2015-01-05 | Disposition: A | Discharge status: Home / Self Care | Payer: 59 | Source: Ambulatory Visit | Attending: Radiation Oncology | Admitting: Radiation Oncology

## 2015-01-05 DIAGNOSIS — C50911 Malignant neoplasm of unspecified site of right female breast: Secondary | ICD-10-CM | POA: Diagnosis not present

## 2015-01-06 ENCOUNTER — Ambulatory Visit
Admission: RE | Admit: 2015-01-06 | Discharge: 2015-01-06 | Disposition: A | Discharge status: Home / Self Care | Payer: 59 | Source: Ambulatory Visit | Attending: Radiation Oncology | Admitting: Radiation Oncology

## 2015-01-06 DIAGNOSIS — C50911 Malignant neoplasm of unspecified site of right female breast: Secondary | ICD-10-CM | POA: Diagnosis not present

## 2015-01-07 ENCOUNTER — Ambulatory Visit
Admission: RE | Admit: 2015-01-07 | Discharge: 2015-01-07 | Disposition: A | Discharge status: Home / Self Care | Payer: 59 | Source: Ambulatory Visit | Attending: Radiation Oncology | Admitting: Radiation Oncology

## 2015-01-07 DIAGNOSIS — C50911 Malignant neoplasm of unspecified site of right female breast: Secondary | ICD-10-CM | POA: Diagnosis not present

## 2015-01-10 ENCOUNTER — Ambulatory Visit
Admission: RE | Admit: 2015-01-10 | Discharge: 2015-01-10 | Disposition: A | Discharge status: Home / Self Care | Payer: 59 | Source: Ambulatory Visit | Attending: Radiation Oncology | Admitting: Radiation Oncology

## 2015-01-10 ENCOUNTER — Encounter: Payer: Self-pay | Admitting: Radiation Oncology

## 2015-01-10 VITALS — BP 117/78 | HR 65 | Temp 98.0°F | Ht 69.0 in | Wt 174.4 lb

## 2015-01-10 DIAGNOSIS — C50411 Malignant neoplasm of upper-outer quadrant of right female breast: Secondary | ICD-10-CM

## 2015-01-10 DIAGNOSIS — C50911 Malignant neoplasm of unspecified site of right female breast: Secondary | ICD-10-CM | POA: Diagnosis not present

## 2015-01-10 MED ORDER — RADIAPLEXRX EX GEL: Freq: Once | CUTANEOUS | Status: DC

## 2015-01-10 MED ORDER — BIAFINE EX EMUL
CUTANEOUS | Status: DC | PRN
  Administered 2015-01-10: 12:00:00 via TOPICAL

## 2015-01-10 NOTE — Progress Notes (Signed)
Weekly Management Note:  Site: Right breast Current Dose:  2500  cGy Projected Dose: 4250  cGy followed by 6 fraction boost  Narrative: The patient is seen today for routine under treatment assessment. CBCT/MVCT images/port films were reviewed. The chart was reviewed.   She is without complaints today.  She uses Radioplex gel.  Physical Examination:  Filed Vitals:   01/10/15 1212  BP: 117/78  Pulse: 65  Temp: 98 F (36.7 C)  .  Weight: 174 lb 6.4 oz (79.107 kg).  There is mild erythema along the inferior aspect of the right breast, no areas of desquamation.  Impression: Tolerating radiation therapy well.  Plan: Continue radiation therapy as planned.

## 2015-01-10 NOTE — Progress Notes (Signed)
[name] [name] has received 10 fractions to right breast.  She denies any pain, but reports a wave of fatigue between 10am to 12pm daily, however, she "walks it off".   Note mild erythema of the right breast and a rash-like appearance in the upper inner portion of the right breast.

## 2015-01-11 ENCOUNTER — Ambulatory Visit
Admission: RE | Admit: 2015-01-11 | Discharge: 2015-01-11 | Disposition: A | Discharge status: Home / Self Care | Payer: 59 | Source: Ambulatory Visit | Attending: Radiation Oncology | Admitting: Radiation Oncology

## 2015-01-11 ENCOUNTER — Encounter: Payer: Self-pay | Admitting: Radiation Oncology

## 2015-01-11 DIAGNOSIS — C50911 Malignant neoplasm of unspecified site of right female breast: Secondary | ICD-10-CM | POA: Diagnosis not present

## 2015-01-11 NOTE — Progress Notes (Signed)
Complex simulation note: The patient underwent virtual simulation for her right breast boost with electrons.  She was set up en face.  One custom block is constructed to conform the field.  I prescribing 1200 cGy 6 sessions utilizing 12 MEV electrons.  A complex isodose plan was reviewed and accepted.

## 2015-01-12 ENCOUNTER — Ambulatory Visit
Admission: RE | Admit: 2015-01-12 | Discharge: 2015-01-12 | Disposition: A | Discharge status: Home / Self Care | Payer: 59 | Source: Ambulatory Visit | Attending: Radiation Oncology | Admitting: Radiation Oncology

## 2015-01-12 DIAGNOSIS — C50911 Malignant neoplasm of unspecified site of right female breast: Secondary | ICD-10-CM | POA: Diagnosis not present

## 2015-01-13 ENCOUNTER — Ambulatory Visit
Admission: RE | Admit: 2015-01-13 | Discharge: 2015-01-13 | Disposition: A | Discharge status: Home / Self Care | Payer: 59 | Source: Ambulatory Visit | Attending: Radiation Oncology | Admitting: Radiation Oncology

## 2015-01-13 DIAGNOSIS — C50911 Malignant neoplasm of unspecified site of right female breast: Secondary | ICD-10-CM | POA: Diagnosis not present

## 2015-01-14 ENCOUNTER — Ambulatory Visit
Admission: RE | Admit: 2015-01-14 | Discharge: 2015-01-14 | Disposition: A | Discharge status: Home / Self Care | Payer: 59 | Source: Ambulatory Visit | Attending: Radiation Oncology | Admitting: Radiation Oncology

## 2015-01-14 DIAGNOSIS — C50911 Malignant neoplasm of unspecified site of right female breast: Secondary | ICD-10-CM | POA: Diagnosis not present

## 2015-01-17 ENCOUNTER — Encounter: Payer: Self-pay | Admitting: Radiation Oncology

## 2015-01-17 ENCOUNTER — Ambulatory Visit
Admission: RE | Admit: 2015-01-17 | Discharge: 2015-01-17 | Disposition: A | Discharge status: Home / Self Care | Payer: 59 | Source: Ambulatory Visit | Attending: Radiation Oncology | Admitting: Radiation Oncology

## 2015-01-17 VITALS — BP 109/66 | HR 75 | Temp 98.0°F | Ht 69.0 in | Wt 174.6 lb

## 2015-01-17 DIAGNOSIS — C50911 Malignant neoplasm of unspecified site of right female breast: Secondary | ICD-10-CM | POA: Diagnosis not present

## 2015-01-17 DIAGNOSIS — C50411 Malignant neoplasm of upper-outer quadrant of right female breast: Secondary | ICD-10-CM

## 2015-01-17 NOTE — Progress Notes (Signed)
[name] [name] has received 15 fractions to her right breast.  Note redness with rash-like appearance in the upper inner portion of her right breast with self report of itching.  Given Biafine.  She reports that she has noted a "bad body odor" since starting  Radiation therapy.

## 2015-01-17 NOTE — Progress Notes (Signed)
Weekly Management Note:  Site: Right breast Current Dose:  3750  cGy Projected Dose: 4250  cGy followed by 6 fraction boost  Narrative: The patient is seen today for routine under treatment assessment. CBCT/MVCT images/port films were reviewed. The chart was reviewed.   She is generally doing well although she does note more pruritus along the upper inner quadrant of her right breast she has been using Biafine cream.  She also notes a "bad body odor" coming from her right axilla.  This is not appreciated by her husband.  Physical Examination: There were no vitals filed for this visit..  Weight:  .  There is mild erythema along the right breast, particularly along the inframammary region and also a papular radiation dermatitis along the upper inner quadrant.  No desquamation.  Impression: Tolerating radiation therapy well.  She may continue with Biafine cream along her breast but she may use hydrocortisone cream instead along the upper inner quadrant where she has more pruritus.  Plan: Continue radiation therapy as planned.

## 2015-01-18 ENCOUNTER — Ambulatory Visit
Admission: RE | Admit: 2015-01-18 | Discharge: 2015-01-18 | Disposition: A | Discharge status: Home / Self Care | Payer: 59 | Source: Ambulatory Visit | Attending: Radiation Oncology | Admitting: Radiation Oncology

## 2015-01-18 DIAGNOSIS — C50911 Malignant neoplasm of unspecified site of right female breast: Secondary | ICD-10-CM | POA: Diagnosis not present

## 2015-01-19 ENCOUNTER — Ambulatory Visit
Admission: RE | Admit: 2015-01-19 | Discharge: 2015-01-19 | Disposition: A | Discharge status: Home / Self Care | Payer: 59 | Source: Ambulatory Visit | Attending: Radiation Oncology | Admitting: Radiation Oncology

## 2015-01-19 ENCOUNTER — Ambulatory Visit
Admission: RE | Admit: 2015-01-19 | Discharge: 2015-01-19 | Disposition: A | Discharge status: Home / Self Care | Payer: 59 | Source: Ambulatory Visit | Admitting: Radiation Oncology

## 2015-01-19 DIAGNOSIS — C50911 Malignant neoplasm of unspecified site of right female breast: Secondary | ICD-10-CM | POA: Diagnosis not present

## 2015-01-20 ENCOUNTER — Ambulatory Visit
Admission: RE | Admit: 2015-01-20 | Discharge: 2015-01-20 | Disposition: A | Discharge status: Home / Self Care | Payer: 59 | Source: Ambulatory Visit | Attending: Radiation Oncology | Admitting: Radiation Oncology

## 2015-01-20 DIAGNOSIS — C50911 Malignant neoplasm of unspecified site of right female breast: Secondary | ICD-10-CM | POA: Diagnosis not present

## 2015-01-21 ENCOUNTER — Ambulatory Visit
Admission: RE | Admit: 2015-01-21 | Discharge: 2015-01-21 | Disposition: A | Discharge status: Home / Self Care | Payer: 59 | Source: Ambulatory Visit | Attending: Radiation Oncology | Admitting: Radiation Oncology

## 2015-01-21 DIAGNOSIS — C50911 Malignant neoplasm of unspecified site of right female breast: Secondary | ICD-10-CM | POA: Diagnosis not present

## 2015-01-24 ENCOUNTER — Ambulatory Visit
Admission: RE | Admit: 2015-01-24 | Discharge: 2015-01-24 | Disposition: A | Discharge status: Home / Self Care | Payer: 59 | Source: Ambulatory Visit | Attending: Radiation Oncology | Admitting: Radiation Oncology

## 2015-01-24 VITALS — BP 118/90 | HR 73 | Temp 98.4°F | Wt 176.1 lb

## 2015-01-24 DIAGNOSIS — C50911 Malignant neoplasm of unspecified site of right female breast: Secondary | ICD-10-CM | POA: Diagnosis not present

## 2015-01-24 DIAGNOSIS — C50411 Malignant neoplasm of upper-outer quadrant of right female breast: Secondary | ICD-10-CM | POA: Diagnosis not present

## 2015-01-24 MED ORDER — BIAFINE EX EMUL
CUTANEOUS | Status: DC | PRN
  Administered 2015-01-24: 16:00:00 via TOPICAL

## 2015-01-24 NOTE — Addendum Note (Signed)
Encounter addended by: Norm Salt, RN on: 01/24/2015  4:08 PM<BR>     Documentation filed: [employer], Orders

## 2015-01-24 NOTE — Progress Notes (Addendum)
Completed 20 of 23 treatments to right breast.Has red itching rash which she is using biafine.Will give additional tube of biafine as [place] leaving [place].Discharge instructions provided.One month follow up provided. BP 118/90 mmHg  Pulse 73  Temp(Src) 98.4 F (36.9 C)  Wt 176 lb 1.6 oz (79.878 kg)

## 2015-01-24 NOTE — Progress Notes (Signed)
Weekly Management Note:  Site: right breast Current Dose:  4850  cGy Projected Dose:   5750 cGy   including boost  Narrative: The patient is seen today for routine under treatment assessment. CBCT/MVCT images/port films were reviewed. The chart was reviewed.    She is without new complaints today.  She still has a pruritic rash along the upper inner quadrant of the right breast. She uses  Biafine cream and occasional hydrocortisone cream.  She will finish her radiation therapy this Thursday.  Physical Examination:  Filed Vitals:   01/24/15 1156  BP: 118/90  Pulse: 73  Temp: 98.4 F (36.9 C)  .  Weight: 176 lb 1.6 oz (79.878 kg).  There is erythema along the right breast with a papular eruption along the upper inner quadrant. There is patchy dry desquamation.  Impression: Tolerating radiation therapy well.  Plan: Continue radiation therapy as planned.  She will finish radiation therapy this Thursday.

## 2015-01-24 NOTE — Addendum Note (Signed)
Encounter addended by: Norm Salt, RN on: 01/24/2015  4:16 PM<BR>     Documentation filed: Inpatient MAR

## 2015-01-25 ENCOUNTER — Ambulatory Visit
Admission: RE | Admit: 2015-01-25 | Discharge: 2015-01-25 | Disposition: A | Discharge status: Home / Self Care | Payer: 59 | Source: Ambulatory Visit | Attending: Radiation Oncology | Admitting: Radiation Oncology

## 2015-01-25 DIAGNOSIS — C50911 Malignant neoplasm of unspecified site of right female breast: Secondary | ICD-10-CM | POA: Diagnosis not present

## 2015-01-26 ENCOUNTER — Ambulatory Visit
Admission: RE | Admit: 2015-01-26 | Discharge: 2015-01-26 | Disposition: A | Discharge status: Home / Self Care | Payer: 59 | Source: Ambulatory Visit | Attending: Radiation Oncology | Admitting: Radiation Oncology

## 2015-01-26 DIAGNOSIS — C50911 Malignant neoplasm of unspecified site of right female breast: Secondary | ICD-10-CM | POA: Diagnosis not present

## 2015-01-27 ENCOUNTER — Encounter: Payer: Self-pay | Admitting: Radiation Oncology

## 2015-01-27 ENCOUNTER — Ambulatory Visit: Payer: 59

## 2015-01-27 ENCOUNTER — Ambulatory Visit
Admission: RE | Admit: 2015-01-27 | Discharge: 2015-01-27 | Disposition: A | Discharge status: Home / Self Care | Payer: 59 | Source: Ambulatory Visit | Attending: Radiation Oncology | Admitting: Radiation Oncology

## 2015-01-27 DIAGNOSIS — C50911 Malignant neoplasm of unspecified site of right female breast: Secondary | ICD-10-CM | POA: Diagnosis not present

## 2015-01-27 NOTE — Progress Notes (Signed)
Weekly Management Note:  Site: Right breast Current Dose:  5450  cGy Projected Dose: 5450  cGy  Narrative: The patient is seen today for routine under treatment assessment. CBCT/MVCT images/port films were reviewed. The chart was reviewed.   She is without new complaints today.  She finished her radiation today.  Physical Examination: There were no vitals filed for this visit..  Weight:  .  There is mild to moderate erythema along the right breast which is somewhat papular along the superior breast.  No real change from earlier this week.  Impression: Radiation therapy well tolerated.  Plan: Follow-up visit in one month.

## 2015-01-27 NOTE — Progress Notes (Signed)
[facility] End of Treatment Note  Name:[name] [name]  Date: 01/27/2015 [id number]:[id number] DOB:[birth year]   Status:outpatient    CC: [name], MD    REFERRING PHYSICIAN:      DIAGNOSIS:    INDICATION FOR TREATMENT: Curative   TREATMENT DATES:                           SITE/DOSE:                            BEAMS/ENERGY:                   NARRATIVE:                            PLAN: Routine followup in one month. Patient instructed to call if questions or worsening complaints in interim.

## 2015-01-27 NOTE — Progress Notes (Signed)
[facility] End of Treatment Note  Name:[name] [name]  Date: 01/27/2015 SPQ:[id number] DOB:03/18/1956   Status:outpatient    CC: [name], MD  Dr. Nicholas Lose, Dr. Adonis Housekeeper  REFERRING PHYSICIAN: Dr. Nicholas Lose    DIAGNOSIS:  Stage I A (T1 N0 M0) invasive ductal carcinoma of the right breast  INDICATION FOR TREATMENT: Curative   TREATMENT DATES: 12/28/2014 through 01/27/2015                          SITE/DOSE:   Right breast 4250 cGy 17 sessions, right breast boost 1200 cGy in 6 sessions                         BEAMS/ENERGY:  Tangential fields to the right breast with 6 MV photons.  Right breast boost with 12 MEV electrons                 NARRATIVE:   The patient tolerated treatment well with mild to moderate radiation dermatitis along the upper inner quadrant of her right breast by completion of therapy.  She used Biafine cream for her dermatitis.                        PLAN: Routine followup in one month. Patient instructed to call if questions or worsening complaints in interim.

## 2015-01-28 ENCOUNTER — Ambulatory Visit: Payer: 59

## 2015-01-28 ENCOUNTER — Ambulatory Visit (HOSPITAL_BASED_OUTPATIENT_CLINIC_OR_DEPARTMENT_OTHER): Payer: 59 | Admitting: Hematology and Oncology

## 2015-01-28 ENCOUNTER — Encounter: Payer: Self-pay | Admitting: Hematology and Oncology

## 2015-01-28 ENCOUNTER — Telehealth: Payer: Self-pay | Admitting: Hematology and Oncology

## 2015-01-28 VITALS — BP 131/72 | HR 69 | Temp 98.2°F | Resp 18 | Ht 69.0 in | Wt 174.8 lb

## 2015-01-28 DIAGNOSIS — C50411 Malignant neoplasm of upper-outer quadrant of right female breast: Secondary | ICD-10-CM

## 2015-01-28 MED ORDER — ANASTROZOLE 1 MG PO TABS: 1.0000 mg | ORAL_TABLET | Freq: Every day | ORAL | Status: DC

## 2015-01-28 NOTE — Progress Notes (Signed)
Patient Care Team: [name], MD as PCP - General (Nurse Practitioner)  DIAGNOSIS: No matching staging information was found for the patient.  SUMMARY OF ONCOLOGIC HISTORY:   Primary cancer of upper outer quadrant of right breast   09/30/2014 Initial Diagnosis Right breast invasive ductal carcinoma grade 1, Ki-67 5%, ER positive, PR positive, HER-2 negative, 1.4 cm mass at 11:30 position no lymph nodes by ultrasound T1 cN0 M0 stage IA clinical stage   11/02/2014 Surgery Right lumpectomy: Invasive ductal carcinoma with extracellular mucin, grade 2/3, 1.8 cm, 0/2 lymph nodes negative, ER 98%, PR 63%, HER-2 negative ratio 1.58, Ki-67 7%, Oncotype DX score 12, 8% risk of recurrence, T1 cN0 stage IA   12/28/2014 - 01/27/2015 Radiation Therapy Adjuvant radiation therapy with boost    CHIEF COMPLIANT: Follow-up after radiation therapy  INTERVAL HISTORY: [name] [name] is a 59 year old with above-mentioned history of right-sided breast cancer treated with lumpectomy and had a low risk Oncotype DX score and did not need chemotherapy, completed radiation therapy as of yesterday. She is here today to discuss adjuvant treatment plan. She had done fairly well from radiation standpoint.  REVIEW OF SYSTEMS:   Constitutional: Denies fevers, chills or abnormal weight loss Eyes: Denies blurriness of vision Ears, nose, mouth, throat, and face: Denies mucositis or sore throat Respiratory: Denies cough, dyspnea or wheezes Cardiovascular: Denies palpitation, chest discomfort or lower extremity swelling Gastrointestinal:  Denies nausea, heartburn or change in bowel habits Skin: Denies abnormal skin rashes Lymphatics: Denies new lymphadenopathy or easy bruising Neurological:Denies numbness, tingling or new weaknesses Behavioral/Psych: Mood is stable, no new changes  Breast: Radiation related skin changes All other systems were reviewed with the patient and are negative.  I have reviewed the past medical history,  past surgical history, social history and family history with the patient and they are unchanged from previous note.  ALLERGIES:  has No Known Allergies.  MEDICATIONS:  Current Outpatient Prescriptions  Medication Sig Dispense Refill  . emollient (BIAFINE) cream Apply 1 application topically as needed.    . Multiple Vitamin (MULTIVITAMIN) tablet Take 1 tablet by mouth daily.    . non-metallic deodorant Jethro Poling) MISC Apply 1 application topically daily as needed.    . vitamin E 1000 UNIT capsule Take 1,000 Units by mouth daily.    Marland Kitchen anastrozole (ARIMIDEX) 1 MG tablet Take 1 tablet (1 mg total) by mouth daily. 90 tablet 3   No current facility-administered medications for this visit.    PHYSICAL EXAMINATION: ECOG PERFORMANCE STATUS: 1 - Symptomatic but completely ambulatory  Filed Vitals:   01/28/15 1145  BP: 131/72  Pulse: 69  Temp: 98.2 F (36.8 C)  Resp: 18   Filed Weights   01/28/15 1145  Weight: 174 lb 12.8 oz (79.289 kg)    GENERAL:alert, no distress and comfortable SKIN: skin color, texture, turgor are normal, no rashes or significant lesions EYES: normal, Conjunctiva are pink and non-injected, sclera clear OROPHARYNX:no exudate, no erythema and lips, buccal mucosa, and tongue normal  NECK: supple, thyroid normal size, non-tender, without nodularity LYMPH:  no palpable lymphadenopathy in the cervical, axillary or inguinal LUNGS: clear to auscultation and percussion with normal breathing effort HEART: regular rate & rhythm and no murmurs and no lower extremity edema ABDOMEN:abdomen soft, non-tender and normal bowel sounds Musculoskeletal:no cyanosis of digits and no clubbing  NEURO: alert & oriented x 3 with fluent speech, no focal motor/sensory deficits  ASSESSMENT & PLAN:  Primary cancer of upper outer quadrant of right breast Right lumpectomy  11/02/2014: Invasive ductal carcinoma with extracellular mucin, grade 2/3, 1.8 cm, 0/2 lymph nodes negative, ER 98%, PR 63%,  HER-2 negative ratio 1.58, Ki-67 7%, Oncotype DX score 12, 8% risk of recurrence, T1 cN0 stage IA status post radiation completed 01/27/2015  Recommendation: 1. Adjuvant antiestrogen therapy with anastrozole 1 mg daily 5 years  Anastrozole counseling:We discussed the risks and benefits of anti-estrogen therapy with aromatase inhibitors. These include but not limited to insomnia, hot flashes, mood changes, vaginal dryness, bone density loss, and weight gain. Although rare, serious side effects including endometrial cancer, risk of blood clots were also discussed. We strongly believe that the benefits far outweigh the risks. Patient understands these risks and consented to starting treatment. Planned treatment duration is 5 years.  Return to clinic in 3 months for follow-up and toxicity check    No orders of the defined types were placed in this encounter.   The patient has a good understanding of the overall plan. she agrees with it. she will call with any problems that may develop before the next visit here.   Rulon Eisenmenger, MD

## 2015-01-28 NOTE — Assessment & Plan Note (Signed)
Right lumpectomy 11/02/2014: Invasive ductal carcinoma with extracellular mucin, grade 2/3, 1.8 cm, 0/2 lymph nodes negative, ER 98%, PR 63%, HER-2 negative ratio 1.58, Ki-67 7%, Oncotype DX score 12, 8% risk of recurrence, T1 cN0 stage IA status post radiation completed 01/27/2015  Recommendation: 1. Adjuvant antiestrogen therapy with anastrozole 1 mg daily 5 years  Anastrozole counseling:We discussed the risks and benefits of anti-estrogen therapy with aromatase inhibitors. These include but not limited to insomnia, hot flashes, mood changes, vaginal dryness, bone density loss, and weight gain. Although rare, serious side effects including endometrial cancer, risk of blood clots were also discussed. We strongly believe that the benefits far outweigh the risks. Patient understands these risks and consented to starting treatment. Planned treatment duration is 5 years.  Return to clinic in 3 months for follow-up and toxicity check

## 2015-01-28 NOTE — Telephone Encounter (Signed)
Gave avs & calendar for January °

## 2015-02-02 ENCOUNTER — Ambulatory Visit: Payer: 59 | Admitting: Hematology and Oncology

## 2015-02-03 ENCOUNTER — Other Ambulatory Visit: Payer: Self-pay | Admitting: General Surgery

## 2015-02-22 ENCOUNTER — Other Ambulatory Visit: Payer: Self-pay | Admitting: Adult Health

## 2015-02-22 DIAGNOSIS — C50411 Malignant neoplasm of upper-outer quadrant of right female breast: Secondary | ICD-10-CM

## 2015-02-28 ENCOUNTER — Encounter: Payer: Self-pay | Admitting: Radiation Oncology

## 2015-03-01 ENCOUNTER — Encounter: Payer: Self-pay | Admitting: Radiation Oncology

## 2015-03-01 ENCOUNTER — Ambulatory Visit
Admission: RE | Admit: 2015-03-01 | Discharge: 2015-03-01 | Disposition: A | Discharge status: Home / Self Care | Payer: 59 | Source: Ambulatory Visit | Attending: Radiation Oncology | Admitting: Radiation Oncology

## 2015-03-01 VITALS — BP 117/75 | HR 72 | Temp 98.4°F | Ht 69.0 in | Wt 175.1 lb

## 2015-03-01 DIAGNOSIS — C50411 Malignant neoplasm of upper-outer quadrant of right female breast: Secondary | ICD-10-CM

## 2015-03-01 HISTORY — DX: Personal history of irradiation: Z92.3

## 2015-03-01 NOTE — Progress Notes (Addendum)
   Follow-up note:  [name] [name] returns today approximately 1 month following completion of radiation therapy for conservative surgery in the management of her T1 N0 invasive ductal carcinoma the right breast.  She is without complaints today.  She started adjuvant Arimidex on October 2.  She tells me she will have a lipoma surgery along the upper right abdomen with [name] [name] in the near future.  She saw [name] [name] on September 25.  Physical examination: Alert and oriented. Filed Vitals:   03/01/15 1507  BP: 117/75  Pulse: 72  Temp: 98.4 F (36.9 C)   Nodes: There is no palpable cervical, supraclavicular, or axillary lymphadenopathy.  Breasts: There is residual erythema/hyperpigmentation the skin along the right breast.  There is slight inversion of both nipples.  There is the usual degree of underlying induration deep to her periareolar biopsy wound.  No discreet masses are appreciated.  Left breast without masses or lesions.  Extremities: Without edema.  Impression: Satisfactory progress.  Plan: Follow-up through [name] [name] and [name] [name].  She should have follow-up mammography next spring at [facility].

## 2015-03-01 NOTE — Progress Notes (Signed)
[name] [name] has returned today with report that her right breast is "back to normal" in regards to skin tone and color.  Denies any pain at this time.  Arimidex started on 02/27/15.

## 2015-03-14 ENCOUNTER — Encounter (HOSPITAL_BASED_OUTPATIENT_CLINIC_OR_DEPARTMENT_OTHER): Payer: Self-pay | Admitting: *Deleted

## 2015-03-23 ENCOUNTER — Encounter (HOSPITAL_BASED_OUTPATIENT_CLINIC_OR_DEPARTMENT_OTHER): Payer: Self-pay | Admitting: *Deleted

## 2015-03-23 ENCOUNTER — Ambulatory Visit (HOSPITAL_BASED_OUTPATIENT_CLINIC_OR_DEPARTMENT_OTHER)
Admission: RE | Admit: 2015-03-23 | Discharge: 2015-03-23 | Disposition: A | Discharge status: Home / Self Care | Payer: 59 | Source: Ambulatory Visit | Attending: General Surgery | Admitting: General Surgery

## 2015-03-23 ENCOUNTER — Encounter (HOSPITAL_BASED_OUTPATIENT_CLINIC_OR_DEPARTMENT_OTHER)
Admission: RE | Disposition: A | Discharge status: Home / Self Care | Payer: Self-pay | Source: Ambulatory Visit | Attending: General Surgery

## 2015-03-23 ENCOUNTER — Ambulatory Visit (HOSPITAL_BASED_OUTPATIENT_CLINIC_OR_DEPARTMENT_OTHER): Payer: 59 | Admitting: Anesthesiology

## 2015-03-23 DIAGNOSIS — D171 Benign lipomatous neoplasm of skin and subcutaneous tissue of trunk: Secondary | ICD-10-CM

## 2015-03-23 DIAGNOSIS — Z87891 Personal history of nicotine dependence: Secondary | ICD-10-CM | POA: Diagnosis not present

## 2015-03-23 DIAGNOSIS — Z853 Personal history of malignant neoplasm of breast: Secondary | ICD-10-CM | POA: Insufficient documentation

## 2015-03-23 HISTORY — PX: LIPOMA EXCISION: SHX5283

## 2015-03-23 HISTORY — DX: Benign lipomatous neoplasm of skin and subcutaneous tissue of trunk: D17.1

## 2015-03-23 SURGERY — EXCISION LIPOMA
Anesthesia: General | Site: Abdomen

## 2015-03-23 MED ORDER — SUCCINYLCHOLINE CHLORIDE 20 MG/ML IJ SOLN
INTRAMUSCULAR | Status: AC
  Filled 2015-03-23: qty 1

## 2015-03-23 MED ORDER — MEPERIDINE HCL 25 MG/ML IJ SOLN: 6.2500 mg | INTRAMUSCULAR | Status: DC | PRN

## 2015-03-23 MED ORDER — MIDAZOLAM HCL 2 MG/2ML IJ SOLN
INTRAMUSCULAR | Status: AC
  Filled 2015-03-23: qty 4

## 2015-03-23 MED ORDER — DEXAMETHASONE SODIUM PHOSPHATE 4 MG/ML IJ SOLN
INTRAMUSCULAR | Status: DC | PRN
  Administered 2015-03-23: 10 mg via INTRAVENOUS

## 2015-03-23 MED ORDER — LIDOCAINE HCL (CARDIAC) 20 MG/ML IV SOLN
INTRAVENOUS | Status: AC
  Filled 2015-03-23: qty 5

## 2015-03-23 MED ORDER — SCOPOLAMINE 1 MG/3DAYS TD PT72: 1.0000 | MEDICATED_PATCH | Freq: Once | TRANSDERMAL | Status: DC | PRN

## 2015-03-23 MED ORDER — ONDANSETRON HCL 4 MG/2ML IJ SOLN
INTRAMUSCULAR | Status: AC
  Filled 2015-03-23: qty 2

## 2015-03-23 MED ORDER — FENTANYL CITRATE (PF) 100 MCG/2ML IJ SOLN
50.0000 ug | INTRAMUSCULAR | Status: DC | PRN
  Administered 2015-03-23: 100 ug via INTRAVENOUS

## 2015-03-23 MED ORDER — PROMETHAZINE HCL 25 MG/ML IJ SOLN
6.2500 mg | INTRAMUSCULAR | Status: DC | PRN
Start: 2015-03-23 — End: 2015-03-23

## 2015-03-23 MED ORDER — PROPOFOL 10 MG/ML IV BOLUS
INTRAVENOUS | Status: DC | PRN
  Administered 2015-03-23: 200 mg via INTRAVENOUS

## 2015-03-23 MED ORDER — PROPOFOL 10 MG/ML IV BOLUS
INTRAVENOUS | Status: AC
  Filled 2015-03-23: qty 20

## 2015-03-23 MED ORDER — CHLORHEXIDINE GLUCONATE 4 % EX LIQD: 1.0000 "application " | Freq: Once | CUTANEOUS | Status: DC

## 2015-03-23 MED ORDER — FENTANYL CITRATE (PF) 100 MCG/2ML IJ SOLN
INTRAMUSCULAR | Status: AC
  Filled 2015-03-23: qty 4

## 2015-03-23 MED ORDER — BUPIVACAINE-EPINEPHRINE (PF) 0.25% -1:200000 IJ SOLN
INTRAMUSCULAR | Status: AC
  Filled 2015-03-23: qty 30

## 2015-03-23 MED ORDER — CEFAZOLIN SODIUM-DEXTROSE 2-3 GM-% IV SOLR
INTRAVENOUS | Status: AC
  Filled 2015-03-23: qty 50

## 2015-03-23 MED ORDER — HYDROCODONE-ACETAMINOPHEN 5-325 MG PO TABS: 1.0000 | ORAL_TABLET | ORAL | Status: DC | PRN

## 2015-03-23 MED ORDER — HYDROMORPHONE HCL 1 MG/ML IJ SOLN: 0.2500 mg | INTRAMUSCULAR | Status: DC | PRN

## 2015-03-23 MED ORDER — LACTATED RINGERS IV SOLN
INTRAVENOUS | Status: DC
  Administered 2015-03-23 (×2): via INTRAVENOUS

## 2015-03-23 MED ORDER — LIDOCAINE HCL (CARDIAC) 20 MG/ML IV SOLN
INTRAVENOUS | Status: DC | PRN
  Administered 2015-03-23: 50 mg via INTRAVENOUS

## 2015-03-23 MED ORDER — BUPIVACAINE-EPINEPHRINE 0.25% -1:200000 IJ SOLN
INTRAMUSCULAR | Status: DC | PRN
  Administered 2015-03-23: 20 mL

## 2015-03-23 MED ORDER — GLYCOPYRROLATE 0.2 MG/ML IJ SOLN: 0.2000 mg | Freq: Once | INTRAMUSCULAR | Status: DC | PRN

## 2015-03-23 MED ORDER — DEXAMETHASONE SODIUM PHOSPHATE 10 MG/ML IJ SOLN
INTRAMUSCULAR | Status: AC
  Filled 2015-03-23: qty 1

## 2015-03-23 MED ORDER — MIDAZOLAM HCL 2 MG/2ML IJ SOLN
1.0000 mg | INTRAMUSCULAR | Status: DC | PRN
  Administered 2015-03-23: 2 mg via INTRAVENOUS

## 2015-03-23 MED ORDER — ONDANSETRON HCL 4 MG/2ML IJ SOLN
INTRAMUSCULAR | Status: DC | PRN
  Administered 2015-03-23: 4 mg via INTRAVENOUS

## 2015-03-23 MED ORDER — CEFAZOLIN SODIUM-DEXTROSE 2-3 GM-% IV SOLR
2.0000 g | INTRAVENOUS | Status: AC
  Administered 2015-03-23: 2 g via INTRAVENOUS

## 2015-03-23 SURGICAL SUPPLY — 55 items
BENZOIN TINCTURE PRP APPL 2/3 (GAUZE/BANDAGES/DRESSINGS) IMPLANT
BLADE CLIPPER SURG (BLADE) IMPLANT
BLADE SURG 15 STRL LF DISP TIS (BLADE) ×1 IMPLANT
BLADE SURG 15 STRL SS (BLADE) ×1
CANISTER SUCT 1200ML W/VALVE (MISCELLANEOUS) IMPLANT
CHLORAPREP W/TINT 26ML (MISCELLANEOUS) ×2 IMPLANT
CLEANER CAUTERY TIP 5X5 PAD (MISCELLANEOUS) ×1 IMPLANT
COVER BACK TABLE 60X90IN (DRAPES) ×2 IMPLANT
COVER MAYO STAND STRL (DRAPES) ×2 IMPLANT
DECANTER SPIKE VIAL GLASS SM (MISCELLANEOUS) IMPLANT
DRAIN CHANNEL 7F FF FLAT (WOUND CARE) IMPLANT
DRAPE LAPAROTOMY 100X72 PEDS (DRAPES) ×2 IMPLANT
DRAPE UTILITY XL STRL (DRAPES) ×2 IMPLANT
ELECT COATED BLADE 2.86 ST (ELECTRODE) ×2 IMPLANT
ELECT REM PT RETURN 9FT ADLT (ELECTROSURGICAL) ×2
ELECTRODE REM PT RTRN 9FT ADLT (ELECTROSURGICAL) ×1 IMPLANT
EVACUATOR SILICONE 100CC (DRAIN) IMPLANT
GLOVE BIOGEL PI IND STRL 7.0 (GLOVE) ×1 IMPLANT
GLOVE BIOGEL PI IND STRL 8 (GLOVE) ×1 IMPLANT
GLOVE BIOGEL PI INDICATOR 7.0 (GLOVE) ×1
GLOVE BIOGEL PI INDICATOR 8 (GLOVE) ×1
GLOVE ECLIPSE 6.5 STRL STRAW (GLOVE) ×2 IMPLANT
GLOVE ECLIPSE 7.5 STRL STRAW (GLOVE) ×2 IMPLANT
GOWN STRL REUS W/ TWL LRG LVL3 (GOWN DISPOSABLE) ×1 IMPLANT
GOWN STRL REUS W/ TWL XL LVL3 (GOWN DISPOSABLE) ×1 IMPLANT
GOWN STRL REUS W/TWL LRG LVL3 (GOWN DISPOSABLE) ×1
GOWN STRL REUS W/TWL XL LVL3 (GOWN DISPOSABLE) ×1
LIQUID BAND (GAUZE/BANDAGES/DRESSINGS) ×2 IMPLANT
NEEDLE HYPO 25X1 1.5 SAFETY (NEEDLE) ×2 IMPLANT
NEEDLE HYPO 30GX1 BEV (NEEDLE) IMPLANT
NS IRRIG 1000ML POUR BTL (IV SOLUTION) IMPLANT
PACK BASIN DAY SURGERY FS (CUSTOM PROCEDURE TRAY) ×2 IMPLANT
PAD CLEANER CAUTERY TIP 5X5 (MISCELLANEOUS) ×1
PENCIL BUTTON HOLSTER BLD 10FT (ELECTRODE) ×2 IMPLANT
SPONGE GAUZE 4X4 12PLY STER LF (GAUZE/BANDAGES/DRESSINGS) IMPLANT
STRIP CLOSURE SKIN 1/2X4 (GAUZE/BANDAGES/DRESSINGS) IMPLANT
STRIP CLOSURE SKIN 1/4X4 (GAUZE/BANDAGES/DRESSINGS) IMPLANT
SUT ETHILON 3 0 PS 1 (SUTURE) IMPLANT
SUT ETHILON 4 0 PS 2 18 (SUTURE) IMPLANT
SUT ETHILON 5 0 P 3 18 (SUTURE)
SUT ETHILON 5 0 PS 2 18 (SUTURE) IMPLANT
SUT MNCRL AB 4-0 PS2 18 (SUTURE) ×2 IMPLANT
SUT MON AB 5-0 PS2 18 (SUTURE) ×2 IMPLANT
SUT NYLON ETHILON 5-0 P-3 1X18 (SUTURE) IMPLANT
SUT VIC AB 3-0 54X BRD REEL (SUTURE) IMPLANT
SUT VIC AB 3-0 BRD 54 (SUTURE)
SUT VIC AB 3-0 SH 27 (SUTURE)
SUT VIC AB 3-0 SH 27X BRD (SUTURE) IMPLANT
SUT VICRYL 3-0 CR8 SH (SUTURE) ×2 IMPLANT
SUT VICRYL 4-0 PS2 18IN ABS (SUTURE) IMPLANT
SYR CONTROL 10ML LL (SYRINGE) ×2 IMPLANT
TOWEL OR 17X24 6PK STRL BLUE (TOWEL DISPOSABLE) ×4 IMPLANT
TOWEL OR NON WOVEN STRL DISP B (DISPOSABLE) ×2 IMPLANT
TUBE CONNECTING 20X1/4 (TUBING) IMPLANT
YANKAUER SUCT BULB TIP NO VENT (SUCTIONS) IMPLANT

## 2015-03-23 NOTE — Interval H&P Note (Signed)
History and Physical Interval Note:  03/23/2015 3:24 PM  [name] [name]  has presented today for surgery, with the diagnosis of lipoma abdominal wall  The various methods of treatment have been discussed with the patient and family. After consideration of risks, benefits and other options for treatment, the patient has consented to  Procedure(s): [place] (N/A) as a surgical intervention .  The patient's history has been reviewed, patient examined, no change in status, stable for surgery.  I have reviewed the patient's chart and labs.  Questions were answered to the patient's satisfaction.     [name]

## 2015-03-23 NOTE — Transfer of Care (Signed)
Immediate Anesthesia Transfer of Care Note  Patient: [name] [name]  Procedure(s) Performed: Procedure(s): EXCISION LIPOMA ABDOMINAL WALLL (N/A)  Patient Location: PACU  Anesthesia Type:General  Level of Consciousness: awake and alert   Airway & Oxygen Therapy: Patient Spontanous Breathing and Patient connected to face mask oxygen  Post-op Assessment: Report given to RN and Post -op Vital signs reviewed and stable  Post vital signs: Reviewed and stable  Last Vitals:  Filed Vitals:   03/23/15 1303  BP: 126/84  Pulse: 78  Temp: 37.1 C  Resp: 16    Complications: No apparent anesthesia complications

## 2015-03-23 NOTE — Op Note (Signed)
Preoperative Diagnosis: lipoma abdominal wall  Postoprative Diagnosis: lipoma abdominal wall  Procedure: Procedure(s): EXCISION LIPOMA ABDOMINAL WALLL   Surgeon: Excell Seltzer T   Assistants: None  Anesthesia:  General LMA anesthesia  Indications: Patient is a 59 year old female who presents with a gradually enlarging subcutaneous mass in her right epigastrium at the costal margin. This has become symptomatic and is definitely enlarging. Exam is consistent with a deep lipoma. After discussion we have elected to proceed with excision under LMA general anesthesia as an outpatient. Indications and alternatives and risks of the procedure have been discussed in detail elsewhere    Procedure Detail: Patient was brought to the operating room, placed in the supine position on the operating table, and laryngeal mask general anesthesia induced. She received preop antibiotics. The upper abdomen was widely sterilely prepped and draped. Patient timeout was performed and correct procedure verified. There was a palpable subcutaneous mass fairly discrete in the deep subcutaneous tissue against the costal margin in the right upper quadrant. A smaller transverse incision was made in the skin crease directly over the mass and dissection carried down through the subcutaneous tissue with cautery. I encountered a well encapsulated fatty soft tissue mass consistent with a lipoma. This was in the deep subcutaneous tissue and lying against the fascia. Using cautery and blunt dissection the mass was completely excised with a small around of surrounding tissue that was adherent in some areas. It was taken directly off of the abdominal wall fascia. It appeared to be completely excised and after removal measured 5 x 3 cm. Hemostasis was obtained with cautery. The soft tissue was infiltrated with local anesthesia. The wound was closed in layers with interrupted 3-0 Vicryl in a running subcuticular 5-0 Monocryl and  Dermabond. Sponge needle and instrument counts were correct.    Findings: Deep subcutaneous mass consistent with lipoma grossly  Estimated Blood Loss:  Minimal         Drains: none  Specimens: Mass abdominal wall, probable lipoma        Complications:  * No complications entered in OR log *         Disposition: PACU - hemodynamically stable.         Condition: stable

## 2015-03-23 NOTE — Anesthesia Preprocedure Evaluation (Signed)
Anesthesia Evaluation  Patient identified by MRN, date of birth, ID band Patient awake    Reviewed: Allergy & Precautions, NPO status , Patient's Chart, lab work & pertinent test results  Airway Mallampati: II   Neck ROM: full    Dental   Pulmonary former smoker,    breath sounds clear to auscultation       Cardiovascular + dysrhythmias Supra Ventricular Tachycardia  Rhythm:regular Rate:Normal  S/p EP ablation.   Neuro/Psych negative neurological ROS  negative psych ROS   GI/Hepatic Neg liver ROS,   Endo/Other  negative endocrine ROS  Renal/GU negative Renal ROS     Musculoskeletal   Abdominal   Peds  Hematology   Anesthesia Other Findings   Reproductive/Obstetrics Breast CA                             Anesthesia Physical  Anesthesia Plan  ASA: II  Anesthesia Plan: General   Post-op Pain Management:    Induction: Intravenous  Airway Management Planned: LMA  Additional Equipment:   Intra-op Plan:   Post-operative Plan: Extubation in OR  Informed Consent: I have reviewed the patients History and Physical, chart, labs and discussed the procedure including the risks, benefits and alternatives for the proposed anesthesia with the patient or authorized representative who has indicated his/her understanding and acceptance.   Dental advisory given  Plan Discussed with: CRNA  Anesthesia Plan Comments:         Anesthesia Quick Evaluation                                  Anesthesia Evaluation  Patient identified by MRN, date of birth, ID band Patient awake    Reviewed: Allergy & Precautions, NPO status , Patient's Chart, lab work & pertinent test results  Airway Mallampati: II   Neck ROM: full    Dental   Pulmonary former smoker,  breath sounds clear to auscultation        Cardiovascular + dysrhythmias Supra Ventricular Tachycardia Rhythm:regular  Rate:Normal  S/p EP ablation.   Neuro/Psych    GI/Hepatic   Endo/Other    Renal/GU      Musculoskeletal   Abdominal   Peds  Hematology   Anesthesia Other Findings   Reproductive/Obstetrics Breast CA                             Anesthesia Physical Anesthesia Plan  ASA: II  Anesthesia Plan: General and Regional   Post-op Pain Management:    Induction: Intravenous  Airway Management Planned: LMA  Additional Equipment:   Intra-op Plan:   Post-operative Plan:   Informed Consent: I have reviewed the patients History and Physical, chart, labs and discussed the procedure including the risks, benefits and alternatives for the proposed anesthesia with the patient or authorized representative who has indicated his/her understanding and acceptance.     Plan Discussed with: CRNA, Anesthesiologist and Surgeon  Anesthesia Plan Comments:         Anesthesia Quick Evaluation

## 2015-03-23 NOTE — Discharge Instructions (Signed)
May shower on Thursday. No creams or oils on glue dressing which will fall off after 2-3 weeks Call as needed for increasing pain, swelling, redness, drainage or other concerns    No activity limitations    Post Anesthesia Home Care Instructions  Activity: Get plenty of rest for the remainder of the day. A responsible adult should stay with you for 24 hours following the procedure.  For the next 24 hours, DO NOT: -Drive a car -Paediatric nurse -Drink alcoholic beverages -Take any medication unless instructed by your physician -Make any legal decisions or sign important papers.  Meals: Start with liquid foods such as gelatin or soup. Progress to regular foods as tolerated. Avoid greasy, spicy, heavy foods. If nausea and/or vomiting occur, drink only clear liquids until the nausea and/or vomiting subsides. Call your physician if vomiting continues.  Special Instructions/Symptoms: Your throat may feel dry or sore from the anesthesia or the breathing tube placed in your throat during surgery. If this causes discomfort, gargle with warm salt water. The discomfort should disappear within 24 hours.  If you had a scopolamine patch placed behind your ear for the management of post- operative nausea and/or vomiting:  1. The medication in the patch is effective for 72 hours, after which it should be removed.  Wrap patch in a tissue and discard in the trash. Wash hands thoroughly with soap and water. 2. You may remove the patch earlier than 72 hours if you experience unpleasant side effects which may include dry mouth, dizziness or visual disturbances. 3. Avoid touching the patch. Wash your hands with soap and water after contact with the patch.

## 2015-03-23 NOTE — Anesthesia Procedure Notes (Signed)
Procedure Name: LMA Insertion Date/Time: 03/23/2015 3:34 PM Performed by: Lieutenant Diego Pre-anesthesia Checklist: Patient identified, Emergency Drugs available, Suction available and Patient being monitored Patient Re-evaluated:Patient Re-evaluated prior to inductionOxygen Delivery Method: Circle System Utilized Preoxygenation: Pre-oxygenation with 100% oxygen Intubation Type: IV induction Ventilation: Mask ventilation without difficulty LMA: LMA inserted LMA Size: 4.0 Number of attempts: 1 Airway Equipment and Method: Bite block Placement Confirmation: positive ETCO2 and breath sounds checked- equal and bilateral Tube secured with: Tape Dental Injury: Teeth and Oropharynx as per pre-operative assessment

## 2015-03-23 NOTE — H&P (Signed)
  History of Present Illness    The patient is a 59 year old female who presents with a complaint of Mass. She returns to the office with a new problem of possible abdominal wall hernia. She states that for a number of years she has had a small area of bulging in her high epigastrium. This did not bother her much. In recent months she feels like this has gotten larger and is causing some pressure and discomfort particularly when she sits a little bit bent over which impinges and puts pressure on the area. No major pain or GI complaints. She has not had any previous surgery in this area.   Problem List/Past Medical  MALIGNANT NEOPLASM OF RIGHT BREAST, STAGE 1 (174.9  C50.911)  Other Problems Lump In Breast CELLULITIS OF FEMALE BREAST (611.0  N61) Back Pain Breast Cancer Inguinal Hernia  Past Surgical History  Breast Biopsy Right. Hysterectomy (not due to cancer) - Partial Knee Surgery Right.  Diagnostic Studies History  Colonoscopy 5-10 years ago Mammogram within last year Pap Smear 1-5 years ago  Allergies No Known Drug Allergies05/04/2015  Medication History  No Current Medications  Social History  No drug use Tobacco use Former smoker. Alcohol use Moderate alcohol use. Caffeine use Coffee, Tea.  Family History  Breast Cancer Family Members In General. Cancer Father. Depression Mother.    Vitals   Weight: 174.6 lb Temp.: 35F(Temporal)  Pulse: 72 (Regular)  Resp.: 18 (Unlabored)  BP: 122/80 (Sitting, Left Arm, Standard)     Physical Exam  The physical exam findings are as follows: Note:General: Alert, well-developed and well nourished Caucasian female, in no distress Skin: Warm and dry without rash or infection. HEENT: No palpable masses or thyromegaly. Sclera nonicteric. Lymph nodes: No cervical, supraclavicular, or inguinal nodes palpable. Breasts: Right breast with moderate post radiation change and otherwise  negative Lungs: Breath sounds clear and equal. No wheezing or increased work of breathing. Cardiovascular: Regular rate and rhythm without murmer. No JVD or edema. Peripheral pulses intact. No carotid bruits. Abdomen: Nondistended. Soft and nontender. In the high epigastrium just to the right of midline and underneath the edge of the rib cage is a 5 x 4 cm soft fleshy discrete palpable mass. No change with Valsalva or coughing or straining. Extremities: No edema or joint swelling or deformity. No chronic venous stasis changes. Neurologic: Alert and fully oriented. Gait normal. No focal weakness. Psychiatric: Normal mood and affect. Thought content appropriate with normal judgement and insight    Assessment & Plan  LIPOMA OF ABDOMINAL WALL (214.1  D17.1) Impression: 5 x 4 cm soft tissue mass upper abdomen most consistent with a lipoma. I discussed the diagnosis with the patient. As this is definitely enlarging and symptomatic I recommended excision and she is in agreement. We discussed the nature of surgery and recovery as well as risks of anesthetic complications, bleeding, infection or seroma. All her questions were answered. Current Plans Pt Education - Benign Tumors: discussed with patient and provided information. Schedule for Surgery Excision of lipoma abdominal wall under general anesthesia as an outpatient  Edward Jolly MD, FACS  03/23/2015, 8:01 AM

## 2015-03-23 NOTE — Anesthesia Postprocedure Evaluation (Signed)
  Anesthesia Post-op Note  Patient: [name] [name]  Procedure(s) Performed: Procedure(s): EXCISION LIPOMA ABDOMINAL WALLL (N/A)  Patient Location: PACU  Anesthesia Type:General  Level of Consciousness: awake and alert   Airway and Oxygen Therapy: Patient Spontanous Breathing  Post-op Pain: mild  Post-op Assessment: Post-op Vital signs reviewed              Post-op Vital Signs: Reviewed  Last Vitals:  Filed Vitals:   03/23/15 1615  BP: 124/69  Pulse: 92  Temp:   Resp: 13    Complications: No apparent anesthesia complications

## 2015-03-24 ENCOUNTER — Encounter (HOSPITAL_BASED_OUTPATIENT_CLINIC_OR_DEPARTMENT_OTHER): Payer: Self-pay | Admitting: General Surgery

## 2015-03-25 ENCOUNTER — Telehealth: Payer: Self-pay | Admitting: Hematology and Oncology

## 2015-03-25 IMAGING — CR DG KNEE COMPLETE 4+V*R*
4 series · 4 of 4 positions shown · non-contrast
Comparison: None.

CLINICAL DATA: Injury by dog today.  Initial evaluation .

EXAM:
RIGHT KNEE - COMPLETE 4+ VIEW

[t knee ap right]
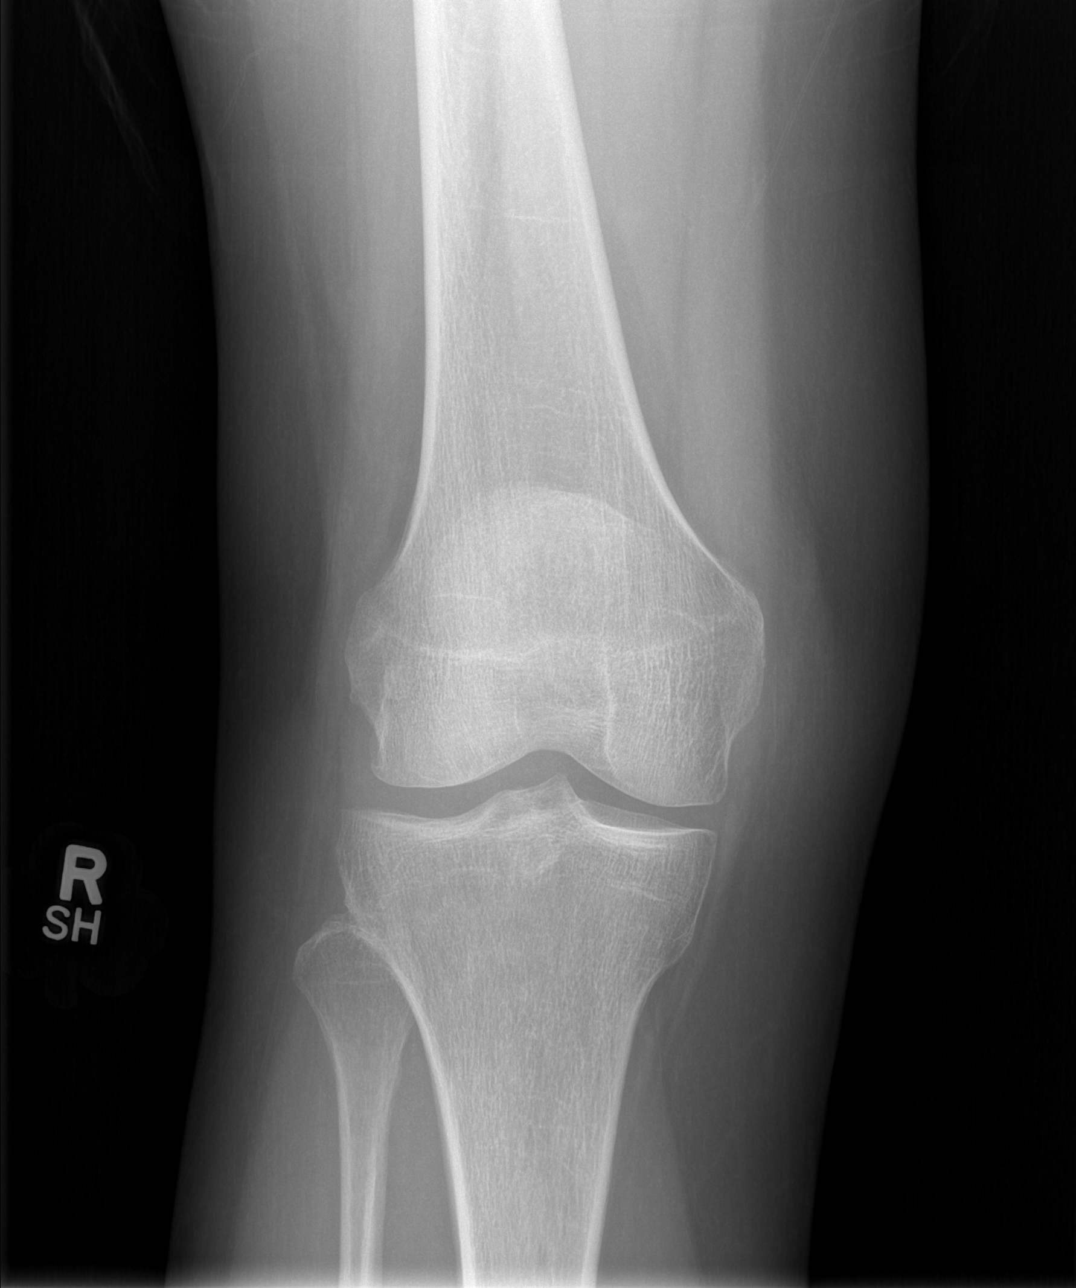

[t knee oblique right (1 of 2)]
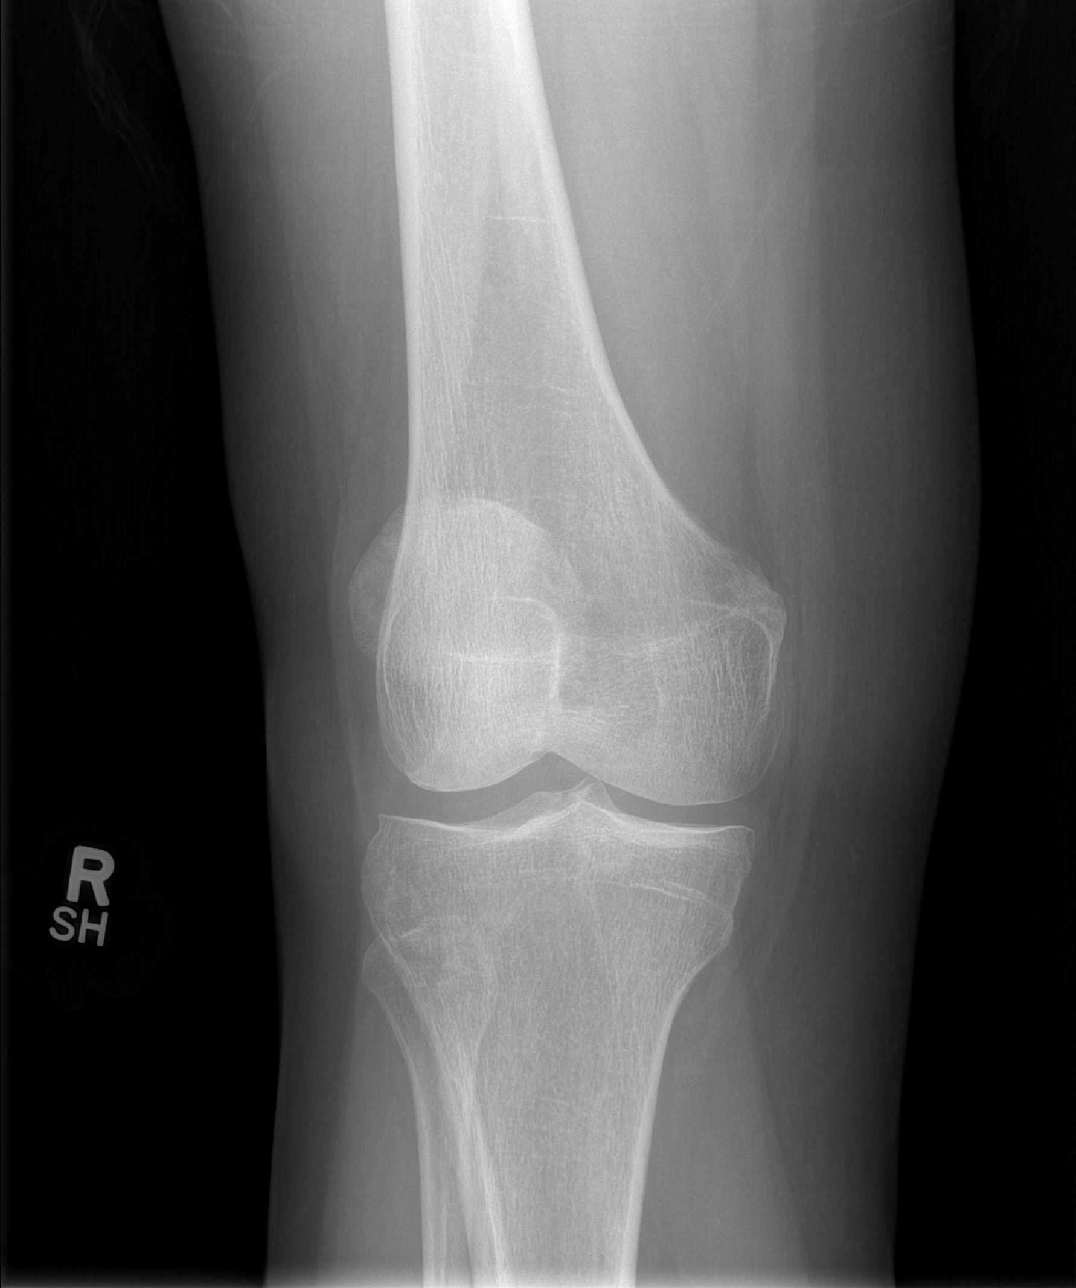

[t knee oblique right (2 of 2)]
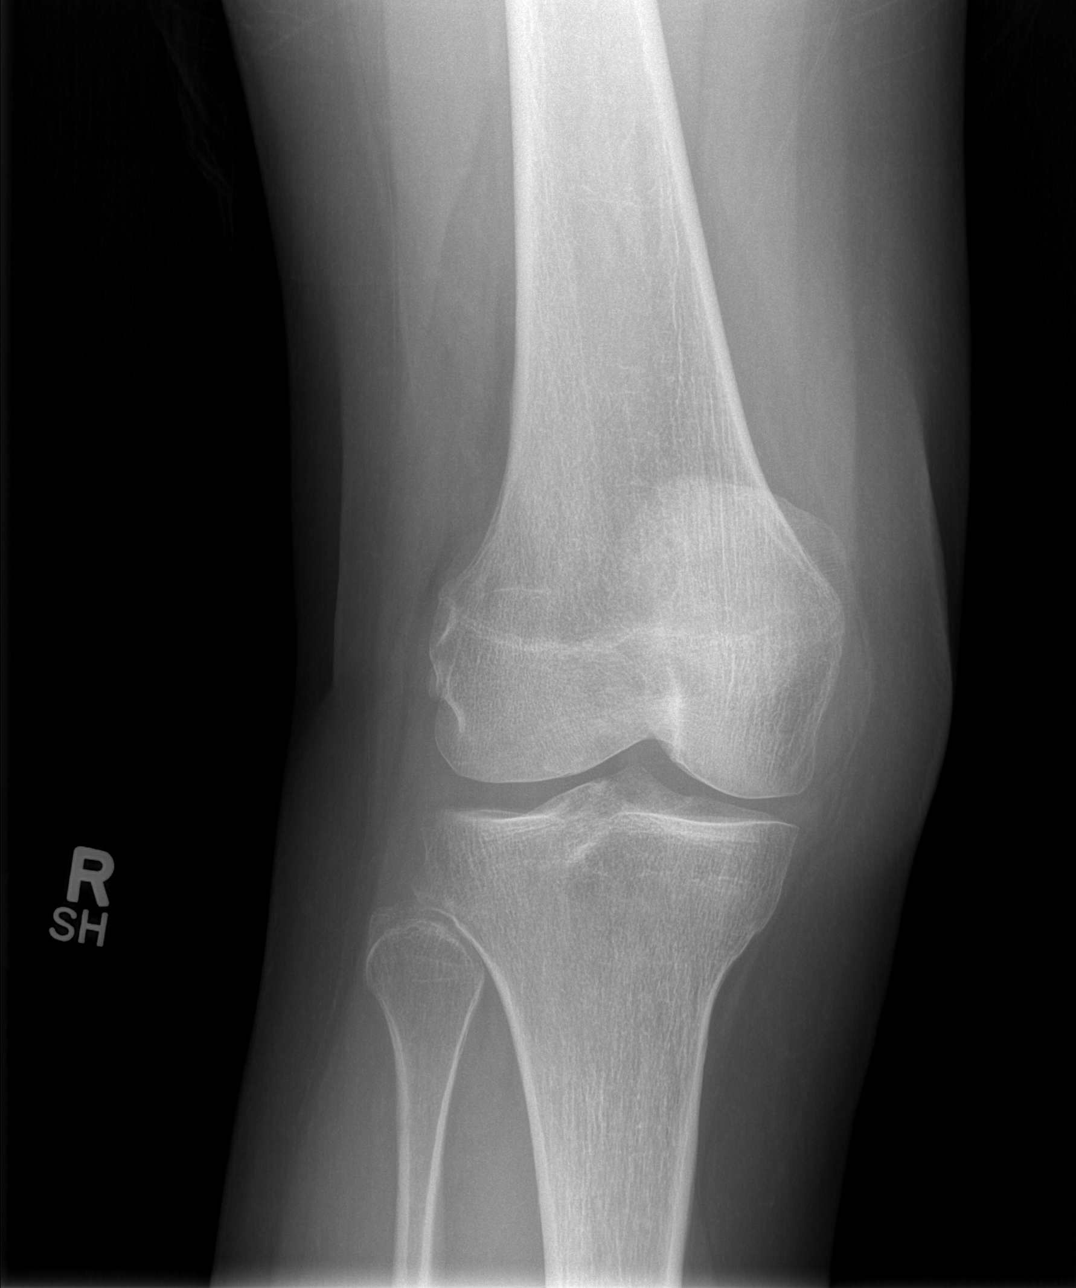

[t knee lat right]
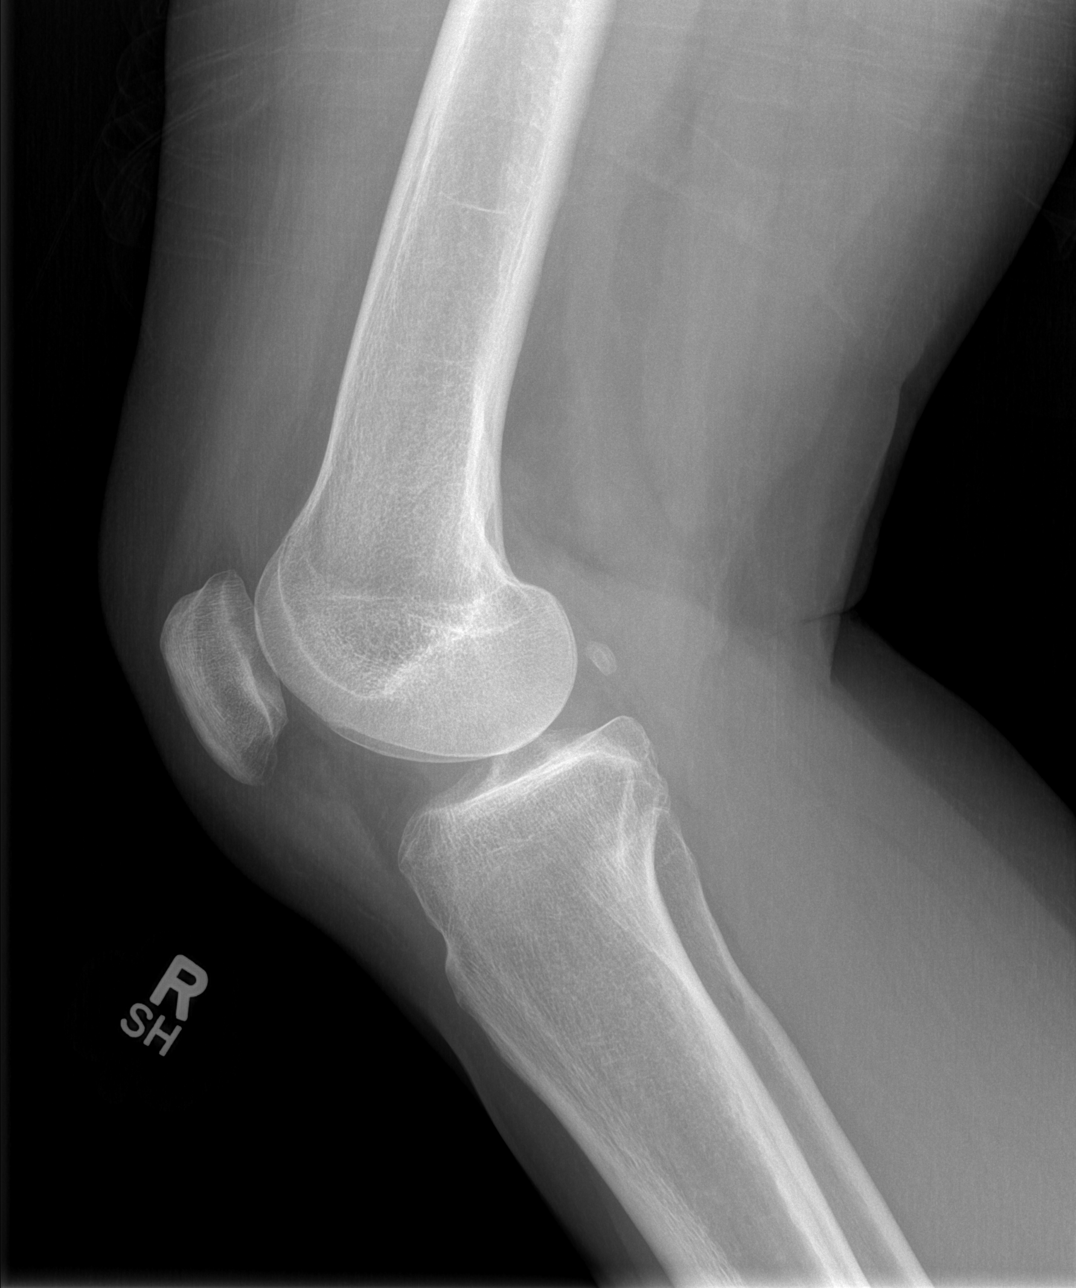

[4 of 4 positions shown; findings below may reference images not displayed]

FINDINGS: There is no evidence of fracture, dislocation, or joint effusion.
There is no evidence of arthropathy or other focal bone abnormality.
Soft tissues are unremarkable.
IMPRESSION: Negative.

## 2015-03-25 NOTE — Telephone Encounter (Signed)
Patient declines survivorship

## 2015-04-28 ENCOUNTER — Encounter: Payer: 59 | Admitting: Nurse Practitioner

## 2015-05-13 ENCOUNTER — Telehealth: Payer: Self-pay | Admitting: Hematology and Oncology

## 2015-05-13 NOTE — Telephone Encounter (Signed)
Lvm advising appt time chg on 06/01/15 from 9.15 to 3.15pm.

## 2015-05-29 HISTORY — PX: COLONOSCOPY: SHX174

## 2015-05-29 HISTORY — PX: POLYPECTOMY: SHX149

## 2015-06-01 ENCOUNTER — Telehealth: Payer: Self-pay | Admitting: Hematology and Oncology

## 2015-06-01 ENCOUNTER — Ambulatory Visit (HOSPITAL_BASED_OUTPATIENT_CLINIC_OR_DEPARTMENT_OTHER): Payer: 59 | Admitting: Hematology and Oncology

## 2015-06-01 ENCOUNTER — Encounter: Payer: Self-pay | Admitting: Hematology and Oncology

## 2015-06-01 ENCOUNTER — Other Ambulatory Visit: Payer: Self-pay | Admitting: Hematology and Oncology

## 2015-06-01 VITALS — BP 120/79 | HR 72 | Temp 97.7°F | Resp 18 | Ht 69.0 in | Wt 179.7 lb

## 2015-06-01 DIAGNOSIS — N951 Menopausal and female climacteric states: Secondary | ICD-10-CM | POA: Diagnosis not present

## 2015-06-01 DIAGNOSIS — C50411 Malignant neoplasm of upper-outer quadrant of right female breast: Secondary | ICD-10-CM

## 2015-06-01 NOTE — Progress Notes (Signed)
Patient Care Team: [name], MD as PCP - General (Nurse Practitioner)  DIAGNOSIS: No matching staging information was found for the patient.  SUMMARY OF ONCOLOGIC HISTORY:   Primary cancer of upper outer quadrant of right breast ([place])   09/30/2014 Initial Diagnosis Right breast invasive ductal carcinoma grade 1, Ki-67 5%, ER positive, PR positive, HER-2 negative, 1.4 cm mass at 11:30 position no lymph nodes by ultrasound T1 cN0 M0 stage IA clinical stage   11/02/2014 Surgery Right lumpectomy: Invasive ductal carcinoma with extracellular mucin, grade 2/3, 1.8 cm, 0/2 lymph nodes negative, ER 98%, PR 63%, HER-2 negative ratio 1.58, Ki-67 7%, Oncotype DX score 12, 8% risk of recurrence, T1 cN0 stage IA   12/28/2014 - 01/27/2015 Radiation Therapy Adjuvant radiation therapy with boost   03/01/2015 -  Anti-estrogen oral therapy Anastrozole 1 mg daily    CHIEF COMPLIANT: follow-up on anastrozole  INTERVAL HISTORY: [name] [name] is a 60 year old with above-mentioned history of right breast cancer treated with lumpectomy radiation and is currently on anastrozole. She is tolerating it extremely well apart from hot flashes. This happened 5-6 times a day. They're not accompanied by sweats. She is also slightly more moody.  REVIEW OF SYSTEMS:   Constitutional: Denies fevers, chills or abnormal weight loss Eyes: Denies blurriness of vision Ears, nose, mouth, throat, and face: Denies mucositis or sore throat Respiratory: Denies cough, dyspnea or wheezes Cardiovascular: Denies palpitation, chest discomfort Gastrointestinal:  Denies nausea, heartburn or change in bowel habits Skin: Denies abnormal skin rashes Lymphatics: Denies new lymphadenopathy or easy bruising Neurological:Denies numbness, tingling or new weaknesses Behavioral/Psych: Mood is stable, no new changes  Extremities: No lower extremity edema Breast:  denies any pain or lumps or nodules in either breasts All other systems were reviewed  with the patient and are negative.  I have reviewed the past medical history, past surgical history, social history and family history with the patient and they are unchanged from previous note.  ALLERGIES:  has No Known Allergies.  MEDICATIONS:  Current Outpatient Prescriptions  Medication Sig Dispense Refill  . anastrozole (ARIMIDEX) 1 MG tablet Take 1 tablet (1 mg total) by mouth daily. 90 tablet 3  . HYDROcodone-acetaminophen (NORCO/VICODIN) 5-325 MG tablet Take 1-2 tablets by mouth every 4 (four) hours as needed for moderate pain or severe pain. 20 tablet 0  . Multiple Vitamin (MULTIVITAMIN) tablet Take 1 tablet by mouth daily.    . vitamin E 1000 UNIT capsule Take 1,000 Units by mouth daily.     No current facility-administered medications for this visit.    PHYSICAL EXAMINATION: ECOG PERFORMANCE STATUS: 1 - Symptomatic but completely ambulatory  Filed Vitals:   06/01/15 1537  BP: 120/79  Pulse: 72  Temp: 97.7 F (36.5 C)  Resp: 18   Filed Weights   06/01/15 1537  Weight: 179 lb 11.2 oz (81.511 kg)    GENERAL:alert, no distress and comfortable SKIN: skin color, texture, turgor are normal, no rashes or significant lesions EYES: normal, Conjunctiva are pink and non-injected, sclera clear OROPHARYNX:no exudate, no erythema and lips, buccal mucosa, and tongue normal  NECK: supple, thyroid normal size, non-tender, without nodularity LYMPH:  no palpable lymphadenopathy in the cervical, axillary or inguinal LUNGS: clear to auscultation and percussion with normal breathing effort HEART: regular rate & rhythm and no murmurs and no lower extremity edema ABDOMEN:abdomen soft, non-tender and normal bowel sounds MUSCULOSKELETAL:no cyanosis of digits and no clubbing  NEURO: alert & oriented x 3 with fluent speech, no focal motor/sensory  deficits EXTREMITIES: No lower extremity edema  ASSESSMENT & PLAN:  Primary cancer of upper outer quadrant of right breast Right lumpectomy  11/02/2014: Invasive ductal carcinoma with extracellular mucin, grade 2/3, 1.8 cm, 0/2 lymph nodes negative, ER 98%, PR 63%, HER-2 negative ratio 1.58, Ki-67 7%, Oncotype DX score 12, 8% risk of recurrence, T1 cN0 stage IA status post radiation completed 01/27/2015  Current treatment: Anastrozole adjuvant therapy 1 mg daily 5 years started October 2016 Anastrozole toxicities:  1. Hot flashes 6-7 times per day She wishes to not take any more medications to treat side effects. I instructed her to try taking it at bedtime and see how many hot flashes she has. 2. Slightly more moody than usual Patient plans to go to [place] in February.  Return to clinic in 6 months for surveillance and follow-up   No orders of the defined types were placed in this encounter.   The patient has a good understanding of the overall plan. she agrees with it. she will call with any problems that may develop before the next visit here.   Rulon Eisenmenger, MD 06/01/2015

## 2015-06-01 NOTE — Telephone Encounter (Signed)
Appointments made and avs printed for patient °

## 2015-06-01 NOTE — Assessment & Plan Note (Signed)
Right lumpectomy 11/02/2014: Invasive ductal carcinoma with extracellular mucin, grade 2/3, 1.8 cm, 0/2 lymph nodes negative, ER 98%, PR 63%, HER-2 negative ratio 1.58, Ki-67 7%, Oncotype DX score 12, 8% risk of recurrence, T1 cN0 stage IA status post radiation completed 01/27/2015  Current treatment: Anastrozole adjuvant therapy 1 mg daily 5 years started October 2016 Anastrozole toxicities:   Return to clinic in 6 months for surveillance and follow-up

## 2015-06-01 NOTE — Addendum Note (Signed)
Addended by: Prentiss Bells on: 06/01/2015 04:14 PM   Modules accepted: Orders, Medications

## 2015-06-06 ENCOUNTER — Ambulatory Visit: Payer: Self-pay | Admitting: Physical Therapy

## 2015-06-06 ENCOUNTER — Ambulatory Visit: Payer: 59 | Admitting: Physical Therapy

## 2015-06-08 ENCOUNTER — Ambulatory Visit: Payer: 59 | Attending: Hematology and Oncology | Admitting: Physical Therapy

## 2015-06-08 DIAGNOSIS — M25511 Pain in right shoulder: Secondary | ICD-10-CM | POA: Insufficient documentation

## 2015-06-08 DIAGNOSIS — M25611 Stiffness of right shoulder, not elsewhere classified: Secondary | ICD-10-CM | POA: Insufficient documentation

## 2015-06-08 DIAGNOSIS — R6889 Other general symptoms and signs: Secondary | ICD-10-CM | POA: Insufficient documentation

## 2015-06-09 ENCOUNTER — Ambulatory Visit: Payer: 59 | Admitting: Physical Therapy

## 2015-06-09 DIAGNOSIS — M25511 Pain in right shoulder: Secondary | ICD-10-CM

## 2015-06-09 DIAGNOSIS — M25611 Stiffness of right shoulder, not elsewhere classified: Secondary | ICD-10-CM

## 2015-06-09 DIAGNOSIS — R6889 Other general symptoms and signs: Secondary | ICD-10-CM

## 2015-06-09 NOTE — Therapy (Addendum)
[place], [place], [address] Phone: [phone]   Fax:  [phone]  Physical Therapy Evaluation  Patient Details  Name: [name] [name] MRN: [id number] Date of Birth: [birth year] Referring Provider: Dr. Nicholas Lose  Encounter Date: 06/08/2015      PT End of Session - 06/09/15 1032    Visit Number 1   Number of Visits 9   Date for PT Re-Evaluation 07/09/15   PT Start Time 1102   PT Stop Time 1200   PT Time Calculation (min) 58 min   Activity Tolerance Patient tolerated treatment well   Behavior During Therapy [facility] for tasks assessed/performed      Past Medical History  Diagnosis Date  . Paroxysmal SVT (supraventricular tachycardia) ([place]) 2004    Status post RFA ABLATION  . Cancer [facility])     right breast cancer  . Cancer of right breast ([place])   . S/P radiation therapy 12/28/2014 through 01/27/2015     Right breast 4250 cGy 17 sessions, right breast boost 1200 cGy in 6 sessions   . Dysrhythmia     no problems since ablation 2004  . Lipoma of abdominal wall     Past Surgical History  Procedure Laterality Date  . Stapedectomy Right   . Abdominal hysterectomy  1999    WITH BLADDER REPAIR FOR PROPLAPSE-HAS OVARIES (NO CA)  . Svt ablation  2000  . Knee cartilage surgery    . Breast lumpectomy with radioactive seed and sentinel lymph node biopsy Right 11/02/2014    Procedure: RIGHT BREAST LUMPECTOMY WITH RADIOACTIVE SEED AND RIGHT AXILLARY SENTINEL LYMPH NODE BIOPSY;  Surgeon: Excell Seltzer, MD;  Location: [place];  Service: General;  Laterality: Right;  . Lipoma excision N/A 03/23/2015    Procedure: EXCISION LIPOMA ABDOMINAL WALLL;  Surgeon: Excell Seltzer, MD;  Location: [place];  Service: General;  Laterality: N/A;    There were no vitals filed for this visit.  Visit Diagnosis:   Pain in joint, shoulder region, right - Plan: PT plan of care cert/re-cert  Stiffness of joint, shoulder region, right - Plan: PT plan of care cert/re-cert  Impaired function of upper extremity - Plan: PT plan of care cert/re-cert      Subjective Assessment - 06/08/15 1110    Subjective When I lift my arm up, it's really tight (at right axilla).   Pertinent History Diagnosed with right breast cancer in approx. May 2016; had lumpectomy in June 2016 with three nodes removed (all negative); had radiation treatment that she completed in early September 2016.  On anastrozole.  No other health issues. h/o low back problems at L4-5 from an accident 30 years ago which flaresup off and on.   Patient Stated Goals Try and stretch out the right shoulder a bit.   Currently in Pain? No/denies   Pain Score --  to 5/10 with lying prone or stretching arm            [facility] PT Assessment - 06/09/15 0001    Assessment   Medical Diagnosis right breast cancer s/p lumpectomy with radiation   Onset Date/Surgical Date 10/27/14  approx.   Precautions   Precautions Other (comment)   Precaution Comments cancer precautions   Restrictions   Weight Bearing Restrictions No   Home Environment   Living Environment Private residence   Living Arrangements Spouse/significant other   Type of [place] Two level   Prior Function  Level of Independence Independent   Vocation Retired   Leisure was very active until this diagnosis and a leg injury with bikeriding, swimming, hiking, etc.  Is planning to join the [employer].   Cognition   Overall Cognitive Status Within Functional Limits for tasks assessed   Observation/Other Assessments   Observations no cording seen   Skin Integrity approx. one inch incision was for both lymph nodes and tumor; now well-healed, at upper outer quadrant of right breast   Quick DASH  13.64   AROM   Right Shoulder Extension 56 Degrees   Right Shoulder Flexion 161  Degrees  discomfort   Right Shoulder ABduction 181 Degrees  painful arc, but at right axilla   Right Shoulder Internal Rotation --  WNL   Right Shoulder External Rotation 90 Degrees   Right Shoulder Horizontal ABduction 19 Degrees   Left Shoulder Extension 54 Degrees   Left Shoulder Flexion 161 Degrees   Left Shoulder ABduction 193 Degrees   Left Shoulder Internal Rotation --  WNL   Left Shoulder External Rotation 75 Degrees   Left Shoulder Horizontal ABduction 17 Degrees   PROM   Right Shoulder Flexion 162 Degrees   Strength   Overall Strength Comments both shoulders grossly 5/5   Ambulation/Gait   Ambulation/Gait Yes   Ambulation/Gait Assistance 7: Independent           LYMPHEDEMA/ONCOLOGY QUESTIONNAIRE - 06/08/15 1135    Type   Cancer Type right breast    Surgeries   Lumpectomy Date 10/27/14  approx.   Treatment   Past Radiation Treatment Yes   Date 01/31/15  approx.   Current Hormone Treatment Yes   Drug Name anastrazole   Lymphedema Assessments   Lymphedema Assessments Upper extremities   Right Upper Extremity Lymphedema   10 cm Proximal to Olecranon Process 29.9 cm   Olecranon Process 25.3 cm   10 cm Proximal to Ulnar Styloid Process 23.9 cm   Just Proximal to Ulnar Styloid Process 17.4 cm   Across Hand at [employer] 19.5 cm   At [place] of 2nd Digit 7.2 cm   Left Upper Extremity Lymphedema   10 cm Proximal to Olecranon Process 29 cm   Olecranon Process 24.8 cm   10 cm Proximal to Ulnar Styloid Process 23.2 cm   Just Proximal to Ulnar Styloid Process 17.3 cm   Across Hand at [employer] 19 cm   At [place] of 2nd Digit 7.1 cm           Quick Dash - 06/09/15 0001    Open a tight or new jar Mild difficulty   Do heavy household chores (wash walls, wash floors) No difficulty   Carry a shopping bag or briefcase No difficulty   Wash your back Mild difficulty   Use a knife to cut food No difficulty   During the past week, to what extent has your  arm, shoulder or hand problem interfered with your normal social activities with family, friends, neighbors, or groups? Slightly   During the past week, to what extent has your arm, shoulder or hand problem limited your work or other regular daily activities [place]   Arm, shoulder, or hand pain. Mild   Tingling (pins and needles) in your arm, shoulder, or hand None   Difficulty Sleeping Mild difficulty   DASH Score 13.64 %                     PT Education -  06/09/15 1041    Education provided Yes   Education Details introduction to lymphedema and her low risk for this; info given about PREP program at [employer]) Educated Patient   Methods Explanation;Handout  handout on PREP program only   Comprehension Verbalized understanding;Need further instruction           Short Term Clinic Goals - 06/09/15 1045    CC Short Term Goal  #1   Title Patient will report at least 30% decrease in right shoulder/chest area pain with active abductjion of right shoulder.   Time 2   Period Weeks   Status New             Long Term Clinic Goals - 06/09/15 1101    CC Long Term Goal  #3   Title Quick DASH score will reduce to 8 or below indicating functional gains.   Baseline 13.64 at eval   Time 4   Period Weeks   Status New            Plan - 06/09/15 1032    Clinical Impression Statement This is a very pleasant woman who had been physically active until this past year when she underwent treatment for right breast cancer including lumpectomy and radiation.  She does have a significant history of low back pain x 30 years which requires her to move carefully in transition such as sit  to and from supine.  She has fairly good shoulder ROM at this time, though right is a bit less than left, but she has an unusual painful arc with active and passive abduction.  This is not a painful arc as in rotator cuff problems, as the discomfort comes in form of pulling in the  right breast area, as opposed to supraspinatus tendon.  She appears to have healed well from treatment, but has soft tissue tightness in treated areas that may be causing this, and will beneift from myofascial release and soft tissue mobilization to the area.  She is also ready to get back to regular exercise, and we discussed options for this.  She was given information about the PREP program at the [facility] and would like to pursue this.   Pt will benefit from skilled therapeutic intervention in order to improve on the following deficits Increased fascial restricitons;Pain;Impaired UE functional use;Decreased activity tolerance   Rehab Potential Excellent   PT Frequency 2x / week   PT Duration 4 weeks   PT Treatment/Interventions Manual techniques;Therapeutic exercise;Patient/family education   PT Next Visit Plan Begin myofascial release and/or soft tissue mobilization for chest/shoulder area, especially on right.  Begin HEP instruction.   Recommended Other Services ABC class, PREP program at [employer] and Agree with Plan of Care Patient         Problem List Patient Active Problem List   Diagnosis Date Noted  . Primary cancer of upper outer quadrant of right breast ([place]) 10/22/2014  . Palpitations 12/25/2012  . History: SVT (supraventricular tachycardia), status post RFA ablation 12/25/2012    Clydia Nieves 06/09/2015, 11:02 AM  [facility], [place], [address] Phone: [phone]   Fax:  [phone]  Name: Mitzel Mirante MRN: [id number] Date of Birth: 06-18-1955   [name], PT 06/09/2015 11:02 AM

## 2015-06-09 NOTE — Patient Instructions (Signed)
[email or web address] Courses Online Strength After Breast Cancer Look at the right of the page for Lymphedema Education Session  

## 2015-06-10 NOTE — Therapy (Signed)
[place], [place], [address] Phone: [phone]   Fax:  [phone]  Physical Therapy Treatment  Patient Details  Name: [name] [name] MRN: [id number] Date of Birth: [birth year] Referring Provider: Dr. Nicholas Lose  Encounter Date: 06/09/2015      PT End of Session - 06/09/15 1811    Visit Number 2   Number of Visits 9   Date for PT Re-Evaluation 07/09/15   PT Start Time [id number]   PT Stop Time 1430   PT Time Calculation (min) 45 min   Activity Tolerance Patient tolerated treatment well   Behavior During Therapy [facility] for tasks assessed/performed      Past Medical History  Diagnosis Date  . Paroxysmal SVT (supraventricular tachycardia) ([place]) 2004    Status post RFA ABLATION  . Cancer [facility])     right breast cancer  . Cancer of right breast ([place])   . S/P radiation therapy 12/28/2014 through 01/27/2015     Right breast 4250 cGy 17 sessions, right breast boost 1200 cGy in 6 sessions   . Dysrhythmia     no problems since ablation 2004  . Lipoma of abdominal wall     Past Surgical History  Procedure Laterality Date  . Stapedectomy Right   . Abdominal hysterectomy  1999    WITH BLADDER REPAIR FOR PROPLAPSE-HAS OVARIES (NO CA)  . Svt ablation  2000  . Knee cartilage surgery    . Breast lumpectomy with radioactive seed and sentinel lymph node biopsy Right 11/02/2014    Procedure: RIGHT BREAST LUMPECTOMY WITH RADIOACTIVE SEED AND RIGHT AXILLARY SENTINEL LYMPH NODE BIOPSY;  Surgeon: Excell Seltzer, MD;  Location: [place];  Service: General;  Laterality: Right;  . Lipoma excision N/A 03/23/2015    Procedure: EXCISION LIPOMA ABDOMINAL WALLL;  Surgeon: Excell Seltzer, MD;  Location: [place];  Service: General;  Laterality: N/A;    There were no vitals filed for this visit.  Visit Diagnosis:   Pain in joint, shoulder region, right  Stiffness of joint, shoulder region, right  Impaired function of upper extremity      Subjective Assessment - 06/09/15 1354    Subjective I feel great ! Pt has an appointment at the [facility] tomorrow and is anxious to get started with the exericse program there             [facility] Adult PT Treatment/Exercise - 06/10/15 0001    Self-Care   Self-Care Other Self-Care Comments   Other Self-Care Comments  issued lymphedema education session on [email or web address] link and reinfoced starting low and prgressing slow with increasing resistance on exercise.  Pt acknowledged understanding    Shoulder Exercises: Supine   Other Supine Exercises external rotation with goal post arms and elbow supported on towel roll with deep breathing for relaxation    Other Supine Exercises arms out to side in adcution with wrists in hyperextension for upper chest stretch wtih deep breathing    Shoulder Exercises: Stretch   Other Shoulder Stretches from sidelying upper trunk rotation with arm reaching back and eyes on hand withing toleratnce with tactile cues for support of arm and making sure there is no pain in back.                 PT Education - 06/10/15 0742    Education provided Yes   Education Details [email or web address] lymphedema education session    Person(s) Educated Patient   Methods Explanation;Handout   Comprehension  Verbalized understanding;Need further instruction           Short Term Clinic Goals - 06/09/15 1045    CC Short Term Goal  #1   Title Patient will report at least 30% decrease in right shoulder/chest area pain with active abductjion of right shoulder.   Time 2   Period Weeks   Status New             Long Term Clinic Goals - 06/09/15 1101    CC Long Term Goal  #3   Title Quick DASH score will reduce to 8 or below indicating functional gains.   Baseline 13.64 at eval   Time 4   Period Weeks   Status New             Plan - 06/09/15 1812    Clinical Impression Statement Pt was very sensitive to touch in right pec major area today and did not tolerate much soft tissue work to this area. she may benefit from an appoach of more active and dynamic stretching to pec major muscle and anterior chest    Pt will benefit from skilled therapeutic intervention in order to improve on the following deficits Increased fascial restricitons;Pain;Impaired UE functional use;Decreased activity tolerance   Rehab Potential Excellent   Clinical Impairments Affecting Rehab Potential past back injury with some difficutly with moving in and out of positions    PT Frequency 2x / week   PT Duration 4 weeks   PT Treatment/Interventions Manual techniques;Therapeutic exercise;Patient/family education   PT Next Visit Plan meeks spinal decompression exercise with emaphasis on scapular retraction and pec minor stretch. try soft tissue work with biotone to right anterior chest    Consulted and Agree with Plan of Care Patient        Problem List Patient Active Problem List   Diagnosis Date Noted  . Primary cancer of upper outer quadrant of right breast ([place]) 10/22/2014  . Palpitations 12/25/2012  . History: SVT (supraventricular tachycardia), status post RFA ablation 12/25/2012    [name] 06/10/2015, 7:43 AM  [place], [place], [address] Phone: [phone]   Fax:  [phone]  Name: [name] [name] MRN: [place]:[id number] Date of Birth: [birth year]

## 2015-06-13 ENCOUNTER — Ambulatory Visit: Payer: 59 | Admitting: Physical Therapy

## 2015-06-13 DIAGNOSIS — R6889 Other general symptoms and signs: Secondary | ICD-10-CM

## 2015-06-13 DIAGNOSIS — M25511 Pain in right shoulder: Secondary | ICD-10-CM | POA: Diagnosis not present

## 2015-06-13 DIAGNOSIS — M25611 Stiffness of right shoulder, not elsewhere classified: Secondary | ICD-10-CM

## 2015-06-13 NOTE — Therapy (Signed)
[place], [place], [address] Phone: [phone]   Fax:  [phone]  Physical Therapy Treatment  Patient Details  Name: [name] [name] MRN: [id number] Date of Birth: 04/03/1956 Referring Provider: Dr. Nicholas Lose  Encounter Date: 06/13/2015      PT End of Session - 06/13/15 0941    Visit Number 3   Number of Visits 9   Date for PT Re-Evaluation 07/09/15   PT Start Time 0850   PT Stop Time 0931   PT Time Calculation (min) 41 min   Activity Tolerance Patient tolerated treatment well   Behavior During Therapy [facility] for tasks assessed/performed      Past Medical History  Diagnosis Date  . Paroxysmal SVT (supraventricular tachycardia) ([place]) 2004    Status post RFA ABLATION  . Cancer [facility])     right breast cancer  . Cancer of right breast ([place])   . S/P radiation therapy 12/28/2014 through 01/27/2015     Right breast 4250 cGy 17 sessions, right breast boost 1200 cGy in 6 sessions   . Dysrhythmia     no problems since ablation 2004  . Lipoma of abdominal wall     Past Surgical History  Procedure Laterality Date  . Stapedectomy Right   . Abdominal hysterectomy  1999    WITH BLADDER REPAIR FOR PROPLAPSE-HAS OVARIES (NO CA)  . Svt ablation  2000  . Knee cartilage surgery    . Breast lumpectomy with radioactive seed and sentinel lymph node biopsy Right 11/02/2014    Procedure: RIGHT BREAST LUMPECTOMY WITH RADIOACTIVE SEED AND RIGHT AXILLARY SENTINEL LYMPH NODE BIOPSY;  Surgeon: Excell Seltzer, MD;  Location: [place];  Service: General;  Laterality: Right;  . Lipoma excision N/A 03/23/2015    Procedure: EXCISION LIPOMA ABDOMINAL WALLL;  Surgeon: Excell Seltzer, MD;  Location: [place];  Service: General;  Laterality: N/A;    There were no vitals filed for this visit.  Visit Diagnosis:   Pain in joint, shoulder region, right  Stiffness of joint, shoulder region, right  Impaired function of upper extremity      Subjective Assessment - 06/13/15 0852    [facility]  Helene Kelp tried to massage and one spot just really hit me.  Was a little tender after last visit.   Currently in Pain? No/denies                         [facility] Adult PT Treatment/Exercise - 06/13/15 0001    Exercises   Exercises Shoulder   Shoulder Exercises: Seated   Abduction AROM;Right  still painful arc at end of session   Other Seated Exercises backward shoulder rolls   Shoulder Exercises: Sidelying   Other Sidelying Exercises in left sidelying, right shoulder diagonals up and out to the point of stretch but not pain x 10   Manual Therapy   Manual Therapy Myofascial release;Passive ROM;Soft tissue mobilization;Scapular mobilization;Neural Stretch   Soft tissue mobilization right anterior shoulder/axilla area, pulling diagonally   Myofascial Release in left sidelying, crosshands to right flank; in supine, right UE pulling, then crosshands technique at right shoulder to abdomen in various diagonals;    Scapular Mobilization in left sidelying to right   Neural Stretch right UE in approx. 90 degrees, then 110 degrees abduction to tolerance  patient felt this all the way to nipple w/wrist oscillations  PT Education - 06/13/15 0940    Education provided Yes   Education Details left sidelying, right UE active diagonal to achieve right chest area stretch without pain   Person(s) Educated Patient   Methods Explanation;Demonstration   Comprehension Verbalized understanding;Returned demonstration           Short Term Clinic Goals - 06/09/15 1045    CC Short Term Goal  #1   Title Patient will report at least 30% decrease in right shoulder/chest area pain with active abductjion of right shoulder.   Time 2   Period Weeks   Status New              Long Term Clinic Goals - 06/09/15 1101    CC Long Term Goal  #3   Title Quick DASH score will reduce to 8 or below indicating functional gains.   Baseline 13.64 at eval   Time 4   Period Weeks   Status New            Plan - 06/13/15 0941    Clinical Impression Statement Patient described feeling better circulation up and down her right arm and felt it was a good session, though at the end of session she still had the same painful arc with active abduction as she had previously.   Pt will benefit from skilled therapeutic intervention in order to improve on the following deficits Increased fascial restricitons;Pain;Impaired UE functional use;Decreased activity tolerance   Rehab Potential Excellent   Clinical Impairments Affecting Rehab Potential past back injury with some difficutly with moving in and out of positions    PT Frequency 2x / week   PT Duration 4 weeks   PT Treatment/Interventions Manual techniques;Passive range of motion;Therapeutic exercise   PT Next Visit Plan continue myofascial release without pain, right shoulder and chest stretches   Consulted and Agree with Plan of Care Patient        Problem List Patient Active Problem List   Diagnosis Date Noted  . Primary cancer of upper outer quadrant of right breast ([place]) 10/22/2014  . Palpitations 12/25/2012  . History: SVT (supraventricular tachycardia), status post RFA ablation 12/25/2012    Allyn Bartelson 06/13/2015, 9:45 AM  [place], [place], [address] Phone: [phone]   Fax:  [phone]  Name: [name] [name] MRN: [id number] Date of Birth: Oct 05, 1955    [name], PT 06/13/2015 9:45 AM

## 2015-06-16 ENCOUNTER — Ambulatory Visit: Payer: 59 | Admitting: Physical Therapy

## 2015-06-16 DIAGNOSIS — R6889 Other general symptoms and signs: Secondary | ICD-10-CM

## 2015-06-16 DIAGNOSIS — M25611 Stiffness of right shoulder, not elsewhere classified: Secondary | ICD-10-CM

## 2015-06-16 DIAGNOSIS — M25511 Pain in right shoulder: Secondary | ICD-10-CM

## 2015-06-16 NOTE — Therapy (Signed)
[place], [place], [address] Phone: [phone]   Fax:  [phone]  Physical Therapy Treatment  Patient Details  Name: [name] [name] MRN: [id number] Date of Birth: 1956-01-08 Referring Provider: Dr. Nicholas Lose  Encounter Date: 06/16/2015      PT End of Session - 06/16/15 1657    Visit Number 4   Number of Visits 9   Date for PT Re-Evaluation 07/09/15   PT Start Time [id number]   PT Stop Time 1600   PT Time Calculation (min) 45 min   Activity Tolerance Patient tolerated treatment well   Behavior During Therapy [facility] for tasks assessed/performed      Past Medical History  Diagnosis Date  . Paroxysmal SVT (supraventricular tachycardia) ([place]) 2004    Status post RFA ABLATION  . Cancer [facility])     right breast cancer  . Cancer of right breast ([place])   . S/P radiation therapy 12/28/2014 through 01/27/2015     Right breast 4250 cGy 17 sessions, right breast boost 1200 cGy in 6 sessions   . Dysrhythmia     no problems since ablation 2004  . Lipoma of abdominal wall     Past Surgical History  Procedure Laterality Date  . Stapedectomy Right   . Abdominal hysterectomy  1999    WITH BLADDER REPAIR FOR PROPLAPSE-HAS OVARIES (NO CA)  . Svt ablation  2000  . Knee cartilage surgery    . Breast lumpectomy with radioactive seed and sentinel lymph node biopsy Right 11/02/2014    Procedure: RIGHT BREAST LUMPECTOMY WITH RADIOACTIVE SEED AND RIGHT AXILLARY SENTINEL LYMPH NODE BIOPSY;  Surgeon: Excell Seltzer, MD;  Location: [place];  Service: General;  Laterality: Right;  . Lipoma excision N/A 03/23/2015    Procedure: EXCISION LIPOMA ABDOMINAL WALLL;  Surgeon: Excell Seltzer, MD;  Location: [place];  Service: General;  Laterality: N/A;    There were no vitals filed for this visit.  Visit Diagnosis:   Pain in joint, shoulder region, right  Stiffness of joint, shoulder region, right  Impaired function of upper extremity      Subjective Assessment - 06/16/15 1519    Subjective i went to water aerobics today, It felt good.    Patient is accompained by: Family member   Pertinent History Diagnosed with right breast cancer in approx. May 2016; had lumpectomy in June 2016 with three nodes removed (all negative); had radiation treatment that she completed in early September 2016.  On anastrozole.  No other health issues. h/o low back problems at L4-5 from an accident 30 years ago which flaresup off and on.   Patient Stated Goals Try and stretch out the right shoulder a bit.   Currently in Pain? No/denies                         [facility] Adult PT Treatment/Exercise - 06/16/15 0001    Self-Care   Self-Care Other Self-Care Comments   Other Self-Care Comments  provided chipe pack to be worn in axilla    Shoulder Exercises: Seated   Other Seated Exercises supine scapular series with red theraband x 5 reps    Shoulder Exercises: Pulleys   Flexion 2 minutes   ABduction 2 minutes;3 minutes   Shoulder Exercises: ROM/Strengthening   Wall Wash 5 reps  mju   Ball on Wall yellow ball on the wall with extra stretch in max flexion for a few seconds.,  for self myofascial release    Other ROM/Strengthening Exercises modified downward dog    Other ROM/Strengthening Exercises leaning agains the wall on a soft ball with ar overhead in scaption with pressure at pec mino    Manual Therapy   Myofascial Release in supine , to axillay scars                 PT Education - 06/16/15 1657    Education provided Yes   Education Details scapular series of theraband exrcise to be done in sitting    Person(s) Educated Patient   Methods Explanation;Demonstration;Handout   Comprehension Verbalized understanding;Returned demonstration           Short Term Clinic Goals - 06/09/15 1045     CC Short Term Goal  #1   Title Patient will report at least 30% decrease in right shoulder/chest area pain with active abductjion of right shoulder.   Time 2   Period Weeks   Status New             Long Term Clinic Goals - 06/16/15 1519    CC Long Term Goal  #1   Title Patient will be knowledgeable about safe home exercise program as well as progressing to gym program both for right shoulder and general conditioning.   Status On-going   CC Long Term Goal  #2   Title Patient will report at least 65% decrease in right shoulder/chest pain when she abducts shoulder actively through full ROM.   Period Months   Status On-going   CC Long Term Goal  #3   Title Quick DASH score will reduce to 8 or below indicating functional gains.   Status On-going            Plan - 06/16/15 1658    Clinical Impression Statement pt is improving here activity with water aerobics today.  Upgreaded exercise to pulleys and wall stretches and notices some edema around scar, so issued a chip pack to be worn in axilla    Clinical Impairments Affecting Rehab Potential past back injury with some difficutly with moving in and out of positions    PT Next Visit Plan check for effectiveness of chip pack, continue with pulleys and streches continue myofascial release without pain, right shoulder and chest stretches        Problem List Patient Active Problem List   Diagnosis Date Noted  . Primary cancer of upper outer quadrant of right breast ([place]) 10/22/2014  . Palpitations 12/25/2012  . History: SVT (supraventricular tachycardia), status post RFA ablation 12/25/2012   Donato Heinz. Owens Shark, PT  06/16/2015, 5:08 PM  [place], [place], [address] Phone: [phone]   Fax:  [phone]  Name: Ajsa Godin MRN: [id number] Date of Birth: 09-09-1955

## 2015-06-16 NOTE — Patient Instructions (Signed)
Over Head Pull: Narrow Grip     [place] [phone]   On back, knees bent, feet flat, band across thighs, elbows straight but relaxed. Pull hands apart (start). Keeping elbows straight, bring arms up and over head, hands toward floor. Keep pull steady on band. Hold momentarily. Return slowly, keeping pull steady, back to start. Repeat 5-10___ times. Band color __red____  Narrow grip and wide grip  Side Pull: Double Arm   On back, knees bent, feet flat. Arms perpendicular to body, shoulder level, elbows straight but relaxed. Pull arms out to sides, elbows straight. Resistance band comes across collarbones, hands toward floor. Hold momentarily. Slowly return to starting position. Repeat _5-10__ times. Band color _red___   Sash   On back, knees bent, feet flat, left hand on left hip, right hand above left. Pull right arm DIAGONALLY (hip to shoulder) across chest. Bring right arm along head toward floor. Hold momentarily. Slowly return to starting position. Repeat _5-10 __ times. Do with left arm. Band color __red___   Shoulder Rotation: Double Arm   On back, knees bent, feet flat, elbows tucked at sides, bent 90, hands palms up. Pull hands apart and down toward floor, keeping elbows near sides. Hold momentarily. Slowly return to starting position. Repeat _5-10__ times. Band color __red____

## 2015-06-20 ENCOUNTER — Ambulatory Visit: Payer: 59 | Admitting: Physical Therapy

## 2015-06-20 DIAGNOSIS — M25511 Pain in right shoulder: Secondary | ICD-10-CM | POA: Diagnosis not present

## 2015-06-20 DIAGNOSIS — R6889 Other general symptoms and signs: Secondary | ICD-10-CM

## 2015-06-20 DIAGNOSIS — M25611 Stiffness of right shoulder, not elsewhere classified: Secondary | ICD-10-CM

## 2015-06-20 NOTE — Therapy (Signed)
[place], [place], [address] Phone: [phone]   Fax:  [phone]  Physical Therapy Treatment  Patient Details  Name: [name] [name] MRN: [id number] Date of Birth: 1956-04-24 Referring Provider: Dr. Nicholas Lose  Encounter Date: 06/20/2015      PT End of Session - 06/20/15 1353    Visit Number 5   Number of Visits 9   Date for PT Re-Evaluation 07/09/15   PT Start Time 1301   PT Stop Time 1348   PT Time Calculation (min) 47 min   Activity Tolerance Patient tolerated treatment well;Patient limited by pain   Behavior During Therapy [facility] for tasks assessed/performed      Past Medical History  Diagnosis Date  . Paroxysmal SVT (supraventricular tachycardia) ([employer]) 2004    Status post RFA ABLATION  . Cancer [facility])     right breast cancer  . Cancer of right breast ([place])   . S/P radiation therapy 12/28/2014 through 01/27/2015     Right breast 4250 cGy 17 sessions, right breast boost 1200 cGy in 6 sessions   . Dysrhythmia     no problems since ablation 2004  . Lipoma of abdominal wall     Past Surgical History  Procedure Laterality Date  . Stapedectomy Right   . Abdominal hysterectomy  1999    WITH BLADDER REPAIR FOR PROPLAPSE-HAS OVARIES (NO CA)  . Svt ablation  2000  . Knee cartilage surgery    . Breast lumpectomy with radioactive seed and sentinel lymph node biopsy Right 11/02/2014    Procedure: RIGHT BREAST LUMPECTOMY WITH RADIOACTIVE SEED AND RIGHT AXILLARY SENTINEL LYMPH NODE BIOPSY;  Surgeon: Excell Seltzer, MD;  Location: [place];  Service: General;  Laterality: Right;  . Lipoma excision N/A 03/23/2015    Procedure: EXCISION LIPOMA ABDOMINAL WALLL;  Surgeon: Excell Seltzer, MD;  Location: [place];  Service: General;  Laterality: N/A;    There were no vitals filed for this  visit.  Visit Diagnosis:  Pain in joint, shoulder region, right  Stiffness of joint, shoulder region, right  Impaired function of upper extremity      Subjective Assessment - 06/20/15 1303    [facility], but did some exercise in the gym and in the pool.  The chip pack helped but it was a nuisance to try to keep it in the place.  Did the Theraband exercises that she was given.   Currently in Pain? No/denies  just tight                         [facility] Adult PT Treatment/Exercise - 06/20/15 0001    Shoulder Exercises: Prone   Other Prone Exercises [place] exercise series x 10 each for right shoulder, all 6 exercises   Shoulder Exercises: Standing   Other Standing Exercises doorway stretch for right pect, 15 counts x 4   Shoulder Exercises: Pulleys   Flexion 2 minutes   ABduction 2 minutes;3 minutes   Manual Therapy   Myofascial Release right UE myofascial pulling with concurrent distraction in the opposite direction; crosshands technique in diagonal and vertical at left chest; release at right pect toward insertion area   Neural Stretch right UE                PT Education - 06/20/15 1323    Education provided Yes   Education Details [place] rotator  cuff strengthening in prone, 6 motions   Person(s) Educated Patient   Methods Explanation;Demonstration;Handout   Comprehension Verbalized understanding           Short Term Clinic Goals - 06/20/15 1357    CC Short Term Goal  #1   Title Patient will report at least 30% decrease in right shoulder/chest area pain with active abductjion of right shoulder.   Status On-going             Long Term Clinic Goals - 06/16/15 1519    CC Long Term Goal  #1   Title Patient will be knowledgeable about safe home exercise program as well as progressing to gym program both for right shoulder and general conditioning.   Status On-going   CC Long Term Goal  #2    Title Patient will report at least 65% decrease in right shoulder/chest pain when she abducts shoulder actively through full ROM.   Period Months   Status On-going   CC Long Term Goal  #3   Title Quick DASH score will reduce to 8 or below indicating functional gains.   Status On-going            Plan - 06/20/15 1353    Clinical Impression Statement Patient has not met goals--still with painful arc with pain at right pect when abducting right shoulder--but she does report decreased tenderness at right pect insertion area (though still with significant tenderness).  She does feel  that therapy is benefitting her, even if progress is not dramatic.   Pt will benefit from skilled therapeutic intervention in order to improve on the following deficits Increased fascial restricitons;Pain;Impaired UE functional use;Decreased activity tolerance   Rehab Potential Excellent   Clinical Impairments Affecting Rehab Potential past back injury with some difficutly with moving in and out of positions    PT Frequency 2x / week   PT Duration 4 weeks   PT Treatment/Interventions Therapeutic exercise;Manual techniques;Passive range of motion   PT Next Visit Plan do more trigger point work at right pect; continue myofascial release and stretching to right pect area as well; check goals   PT Home Exercise Plan add [place] right shoulder strengthening to HEP   Consulted and Agree with Plan of Care Patient        Problem List Patient Active Problem List   Diagnosis Date Noted  . Primary cancer of upper outer quadrant of right breast ([place]) 10/22/2014  . Palpitations 12/25/2012  . History: SVT (supraventricular tachycardia), status post RFA ablation 12/25/2012    Lashun Mccants 06/20/2015, 1:58 PM  [place], [place], [address] Phone: [phone]   Fax:  [phone]  Name: Aysia Lowder MRN: [id number] Date of  Birth: January 22, 1956    [name], PT 06/20/2015 1:58 PM

## 2015-06-21 ENCOUNTER — Telehealth: Payer: Self-pay | Admitting: *Deleted

## 2015-06-21 DIAGNOSIS — C50411 Malignant neoplasm of upper-outer quadrant of right female breast: Secondary | ICD-10-CM

## 2015-06-21 MED ORDER — UNABLE TO FIND: Status: DC

## 2015-06-21 NOTE — Telephone Encounter (Signed)
Let pt know Rx is ready.  Pt states she will pick up at front desk tomorrow.

## 2015-06-21 NOTE — Telephone Encounter (Signed)
TC from patient requesting a prescription/order for a compression sleeve.

## 2015-06-22 ENCOUNTER — Ambulatory Visit: Payer: 59

## 2015-06-22 DIAGNOSIS — R6889 Other general symptoms and signs: Secondary | ICD-10-CM

## 2015-06-22 DIAGNOSIS — M25511 Pain in right shoulder: Secondary | ICD-10-CM | POA: Diagnosis not present

## 2015-06-22 DIAGNOSIS — M25611 Stiffness of right shoulder, not elsewhere classified: Secondary | ICD-10-CM

## 2015-06-22 NOTE — Therapy (Signed)
[place], [place], [address] Phone: [phone]   Fax:  [phone]  Physical Therapy Treatment  Patient Details  Name: [name] [name] MRN: [id number] Date of Birth: [birth year] Referring Provider: Dr. Nicholas Lose  Encounter Date: 06/22/2015      PT End of Session - 06/22/15 1518    Visit Number 6   Number of Visits 9   Date for PT Re-Evaluation 07/09/15   PT Start Time 1433   PT Stop Time 1517   PT Time Calculation (min) 44 min   Activity Tolerance Patient tolerated treatment well   Behavior During Therapy [facility] for tasks assessed/performed      Past Medical History  Diagnosis Date  . Paroxysmal SVT (supraventricular tachycardia) ([place]) 2004    Status post RFA ABLATION  . Cancer [facility])     right breast cancer  . Cancer of right breast ([place])   . S/P radiation therapy 12/28/2014 through 01/27/2015     Right breast 4250 cGy 17 sessions, right breast boost 1200 cGy in 6 sessions   . Dysrhythmia     no problems since ablation 2004  . Lipoma of abdominal wall     Past Surgical History  Procedure Laterality Date  . Stapedectomy Right   . Abdominal hysterectomy  1999    WITH BLADDER REPAIR FOR PROPLAPSE-HAS OVARIES (NO CA)  . Svt ablation  2000  . Knee cartilage surgery    . Breast lumpectomy with radioactive seed and sentinel lymph node biopsy Right 11/02/2014    Procedure: RIGHT BREAST LUMPECTOMY WITH RADIOACTIVE SEED AND RIGHT AXILLARY SENTINEL LYMPH NODE BIOPSY;  Surgeon: Excell Seltzer, MD;  Location: [place];  Service: General;  Laterality: Right;  . Lipoma excision N/A 03/23/2015    Procedure: EXCISION LIPOMA ABDOMINAL WALLL;  Surgeon: Excell Seltzer, MD;  Location: [place];  Service: General;  Laterality: N/A;    There were no vitals filed for this visit.  Visit Diagnosis:   Pain in joint, shoulder region, right  Stiffness of joint, shoulder region, right  Impaired function of upper extremity      Subjective Assessment - 06/22/15 1439    Subjective Did a 45 minute cycle class this morning and then got in the pool for a little while and I loved that.    Currently in Pain? No/denies                         [facility] Adult PT Treatment/Exercise - 06/22/15 0001    Manual Therapy   Myofascial Release right UE myofascial pulling with concurrent distraction in the opposite direction; crosshands technique in diagonal and vertical at left chest; release at right pect toward insertion area   Neural Stretch right UE                   Short Term Clinic Goals - 06/22/15 1436    CC Short Term Goal  #1   Title Patient will report at least 30% decrease in right shoulder/chest area pain with active abductjion of right shoulder.  10% improvement reported at this time 06/22/15   Status On-going             [place] - 06/22/15 1437    CC Long Term Goal  #1   Title Patient will be knowledgeable about safe home exercise program as well as progressing to gym program both for right shoulder and general  conditioning.  Been back to the gym 3 times now and doing well   Status Partially Met   CC Long Term Goal  #2   Title Patient will report at least 65% decrease in right shoulder/chest pain when she abducts shoulder actively through full ROM.   Status On-going   CC Long Term Goal  #3   Title Quick DASH score will reduce to 8 or below indicating functional gains.   Status On-going            Plan - 06/22/15 1519    Clinical Impression Statement Did not assess painful arc with AROM today but no c/o this with PROM and her tenderness, though still presents, is reportedly less each visit. Progressing well towards goals, though slowly.   Pt will benefit from skilled therapeutic intervention in order to improve on the following  deficits Increased fascial restricitons;Pain;Impaired UE functional use;Decreased activity tolerance   Rehab Potential Excellent   Clinical Impairments Affecting Rehab Potential past back injury with some difficutly with moving in and out of positions    PT Frequency 2x / week   PT Duration 4 weeks   PT Treatment/Interventions Therapeutic exercise;Manual techniques;Passive range of motion   PT Next Visit Plan do more trigger point work at right pect; continue myofascial release and stretching to right pect area as well; check goals   Consulted and Agree with Plan of Care Patient        Problem List Patient Active Problem List   Diagnosis Date Noted  . Primary cancer of upper outer quadrant of right breast ([place]) 10/22/2014  . Palpitations 12/25/2012  . History: SVT (supraventricular tachycardia), status post RFA ablation 12/25/2012    Otelia Limes, PTA 06/22/2015, 3:21 PM  [place], [place], [address] Phone: [phone]   Fax:  [phone]  Name: [name] [name] MRN: [id number] Date of Birth: 1955-10-14

## 2015-06-27 ENCOUNTER — Ambulatory Visit: Payer: 59 | Admitting: Physical Therapy

## 2015-06-27 DIAGNOSIS — R6889 Other general symptoms and signs: Secondary | ICD-10-CM

## 2015-06-27 DIAGNOSIS — M25511 Pain in right shoulder: Secondary | ICD-10-CM | POA: Diagnosis not present

## 2015-06-27 DIAGNOSIS — M25611 Stiffness of right shoulder, not elsewhere classified: Secondary | ICD-10-CM

## 2015-06-27 NOTE — Therapy (Signed)
[place], [place], [address] Phone: [phone]   Fax:  [phone]  Physical Therapy Treatment  Patient Details  Name: [name] [name] MRN: [id number] Date of Birth: 02-25-1956 Referring Provider: Dr. Nicholas Lose  Encounter Date: 06/27/2015      PT End of Session - 06/27/15 1258    Visit Number 7   Number of Visits 9   Date for PT Re-Evaluation 07/09/15   PT Start Time 1123   PT Stop Time 1209   PT Time Calculation (min) 46 min   Activity Tolerance Patient tolerated treatment well   Behavior During Therapy [facility] for tasks assessed/performed      Past Medical History  Diagnosis Date  . Paroxysmal SVT (supraventricular tachycardia) ([place]) 2004    Status post RFA ABLATION  . Cancer [facility])     right breast cancer  . Cancer of right breast ([place])   . S/P radiation therapy 12/28/2014 through 01/27/2015     Right breast 4250 cGy 17 sessions, right breast boost 1200 cGy in 6 sessions   . Dysrhythmia     no problems since ablation 2004  . Lipoma of abdominal wall     Past Surgical History  Procedure Laterality Date  . Stapedectomy Right   . Abdominal hysterectomy  1999    WITH BLADDER REPAIR FOR PROPLAPSE-HAS OVARIES (NO CA)  . Svt ablation  2000  . Knee cartilage surgery    . Breast lumpectomy with radioactive seed and sentinel lymph node biopsy Right 11/02/2014    Procedure: RIGHT BREAST LUMPECTOMY WITH RADIOACTIVE SEED AND RIGHT AXILLARY SENTINEL LYMPH NODE BIOPSY;  Surgeon: Excell Seltzer, MD;  Location: [place];  Service: General;  Laterality: Right;  . Lipoma excision N/A 03/23/2015    Procedure: EXCISION LIPOMA ABDOMINAL WALLL;  Surgeon: Excell Seltzer, MD;  Location: [place];  Service: General;  Laterality: N/A;    There were no vitals filed for this visit.  Visit Diagnosis:   Pain in joint, shoulder region, right  Stiffness of joint, shoulder region, right  Impaired function of upper extremity      Subjective Assessment - 06/27/15 1124    [facility].  Will meet with the trainer this Friday.   Currently in Pain? No/denies                         [facility] Adult PT Treatment/Exercise - 06/27/15 0001    Shoulder Exercises: Seated   Other Seated Exercises active backward shoulder rolls x10   Other Seated Exercises bilateral D2 active shoulder flexion/abduction x 5   Manual Therapy   Manual Therapy Other (comment);Passive ROM   Myofascial Release right UE myofascial pulling with concurrent distraction in the opposite direction; crosshands technique in diagonal and vertical at right chest; release at right pect toward insertion area; right UE myofascial pulling in supine and left sidelying   Scapular Mobilization to right shoulder in left sidelying for protraction, depression, and also lifting scapula away from ribcage   Passive ROM bilat. pect minor stretches   Other Manual Therapy trigger point release at right pect insertion area   Neural Stretch right UE                   Short Term Clinic Goals - 06/22/15 1436    CC Short Term Goal  #1   Title Patient will report at least 30%  decrease in right shoulder/chest area pain with active abductjion of right shoulder.  10% improvement reported at this time 06/22/15   Status On-going             [place] - 06/22/15 1437    CC Long Term Goal  #1   Title Patient will be knowledgeable about safe home exercise program as well as progressing to gym program both for right shoulder and general conditioning.  Been back to the gym 3 times now and doing well   Status Partially Met   CC Long Term Goal  #2   Title Patient will report at least 65% decrease in right shoulder/chest pain when she abducts shoulder actively through full ROM.    Status On-going   CC Long Term Goal  #3   Title Quick DASH score will reduce to 8 or below indicating functional gains.   Status On-going            Plan - 06/27/15 1259    Clinical Impression Statement Patient with signficant tenderness at points near pect insertion on right side; these tender spots tend to feel lumpy, though small, and one area feels ropey where it is tender.  Patient plans to ask her doctor about this.  She is making gradual progress, albeit slower than anticipated.  She is pleased to be working out at [employer], even if there have been some complications in doing the PREP program.   Pt will benefit from skilled therapeutic intervention in order to improve on the following deficits Increased fascial restricitons;Pain;Impaired UE functional use;Decreased activity tolerance   Rehab Potential Excellent   Clinical Impairments Affecting Rehab Potential past back injury with some difficutly with moving in and out of positions    PT Frequency 2x / week   PT Duration 4 weeks   PT Treatment/Interventions Passive range of motion;Manual techniques;Therapeutic exercise   PT Next Visit Plan do more trigger point work at right pect; continue myofascial release and stretching to right pect area as well; check goals   Consulted and Agree with Plan of Care Patient        Problem List Patient Active Problem List   Diagnosis Date Noted  . Primary cancer of upper outer quadrant of right breast ([place]) 10/22/2014  . Palpitations 12/25/2012  . History: SVT (supraventricular tachycardia), status post RFA ablation 12/25/2012    Narayan Scull 06/27/2015, 1:03 PM  [place], [place], [address] Phone: [phone]   Fax:  [phone]  Name: [name] [name] MRN: [id number] Date of Birth: 15-Nov-1955    [name], PT 06/27/2015 1:03 PM

## 2015-06-29 ENCOUNTER — Ambulatory Visit: Payer: 59 | Attending: Hematology and Oncology | Admitting: Physical Therapy

## 2015-06-29 DIAGNOSIS — M25611 Stiffness of right shoulder, not elsewhere classified: Secondary | ICD-10-CM | POA: Diagnosis present

## 2015-06-29 DIAGNOSIS — M25511 Pain in right shoulder: Secondary | ICD-10-CM | POA: Diagnosis present

## 2015-06-29 DIAGNOSIS — R6889 Other general symptoms and signs: Secondary | ICD-10-CM | POA: Diagnosis present

## 2015-06-29 NOTE — Therapy (Signed)
[place], [place], [address] Phone: [phone]   Fax:  [phone]  Physical Therapy Treatment  Patient Details  Name: [name] [name] MRN: [id number] Date of Birth: 05-20-1956 Referring Provider: Dr. Nicholas Lose  Encounter Date: 06/29/2015      PT End of Session - 06/29/15 1224    Visit Number 8   Number of Visits 14   Date for PT Re-Evaluation 07/11/15   PT Start Time 1108   PT Stop Time 1154   PT Time Calculation (min) 46 min   Activity Tolerance Patient tolerated treatment well   Behavior During Therapy [facility] for tasks assessed/performed      Past Medical History  Diagnosis Date  . Paroxysmal SVT (supraventricular tachycardia) ([place]) 2004    Status post RFA ABLATION  . Cancer [facility])     right breast cancer  . Cancer of right breast ([place])   . S/P radiation therapy 12/28/2014 through 01/27/2015     Right breast 4250 cGy 17 sessions, right breast boost 1200 cGy in 6 sessions   . Dysrhythmia     no problems since ablation 2004  . Lipoma of abdominal wall     Past Surgical History  Procedure Laterality Date  . Stapedectomy Right   . Abdominal hysterectomy  1999    WITH BLADDER REPAIR FOR PROPLAPSE-HAS OVARIES (NO CA)  . Svt ablation  2000  . Knee cartilage surgery    . Breast lumpectomy with radioactive seed and sentinel lymph node biopsy Right 11/02/2014    Procedure: RIGHT BREAST LUMPECTOMY WITH RADIOACTIVE SEED AND RIGHT AXILLARY SENTINEL LYMPH NODE BIOPSY;  Surgeon: Excell Seltzer, MD;  Location: [place];  Service: General;  Laterality: Right;  . Lipoma excision N/A 03/23/2015    Procedure: EXCISION LIPOMA ABDOMINAL WALLL;  Surgeon: Excell Seltzer, MD;  Location: [place];  Service: General;  Laterality: N/A;    There were no vitals filed for this visit.  Visit Diagnosis:   Pain in joint, shoulder region, right - Plan: PT plan of care cert/re-cert  Stiffness of joint, shoulder region, right - Plan: PT plan of care cert/re-cert  Impaired function of upper extremity - Plan: PT plan of care cert/re-cert      Subjective Assessment - 06/29/15 1109    [facility]; really liked it.  Did a cycle class before that.   Currently in Pain? Yes   Pain Score 2    Pain Location Breast   Pain Orientation Right   Pain Descriptors / Indicators Aching  "a little uncomfortable"   Aggravating Factors  therapy session, water aerobics   Pain Relieving Factors stretching it out                         [facility] Adult PT Treatment/Exercise - 06/29/15 0001    Manual Therapy   Myofascial Release right UE myofascial pulling with concurrent distraction in the opposite direction; crosshands technique in diagonal and vertical at right chest; release at right pect toward insertion area; right UE myofascial pulling in supine and left sidelying   Scapular Mobilization to right shoulder in left sidelying for protraction, depression, and also lifting scapula away from ribcage   Passive ROM bilat. pect minor stretches   Neural Stretch right UE at various degrees of shoulder abduction  Short Term Clinic Goals - 06/22/15 1436    CC Short Term Goal  #1   Title Patient will report at least 30% decrease in right shoulder/chest area pain with active abductjion of right shoulder.  10% improvement reported at this time 06/22/15   Status On-going             Long Term Clinic Goals - 06/29/15 1112    CC Long Term Goal  #1   Title Patient will be knowledgeable about safe home exercise program as well as progressing to gym program both for right shoulder and general conditioning.   Status Achieved   CC Long Term Goal  #2   Title Patient will report at least 65% decrease in right shoulder/chest pain when  she abducts shoulder actively through full ROM.   Baseline reports 50% less pain during this motion as of 06/29/15, but still with pain when stretches to end range   Status Partially Met            Plan - 06/29/15 1224    Clinical Impression Statement Patient reports definite improvement, but mostly in tightness at right axilla and less so in the breast where she experiences pulling and discomfort with some movements.  She had increased soreness after last session with trigger point work at area of tenderness, so that was not done today.     Pt will benefit from skilled therapeutic intervention in order to improve on the following deficits Increased fascial restricitons;Pain;Impaired UE functional use;Decreased activity tolerance   Rehab Potential Excellent   Clinical Impairments Affecting Rehab Potential past back injury with some difficutly with moving in and out of positions    PT Frequency 3x / week   PT Duration 2 weeks   PT Treatment/Interventions Passive range of motion;Manual techniques;Therapeutic exercise   PT Next Visit Plan do more trigger point work at right pect; continue myofascial release and stretching to right pect area as well; check goals   Consulted and Agree with Plan of Care Patient        Problem List Patient Active Problem List   Diagnosis Date Noted  . Primary cancer of upper outer quadrant of right breast ([place]) 10/22/2014  . Palpitations 12/25/2012  . History: SVT (supraventricular tachycardia), status post RFA ablation 12/25/2012    Jannatul Wojdyla 06/29/2015, 12:29 PM  [place], [place], [address] Phone: [phone]   Fax:  [phone]  Name: Laporscha Linehan MRN: [id number] Date of Birth: 01/07/1956    [name], PT 06/29/2015 12:29 PM

## 2015-07-01 ENCOUNTER — Ambulatory Visit: Payer: 59 | Admitting: Physical Therapy

## 2015-07-01 DIAGNOSIS — M25611 Stiffness of right shoulder, not elsewhere classified: Secondary | ICD-10-CM

## 2015-07-01 DIAGNOSIS — M25511 Pain in right shoulder: Secondary | ICD-10-CM | POA: Diagnosis not present

## 2015-07-01 DIAGNOSIS — R6889 Other general symptoms and signs: Secondary | ICD-10-CM

## 2015-07-01 NOTE — Therapy (Signed)
[place], [place], [address] Phone: [phone]   Fax:  [phone]  Physical Therapy Treatment  Patient Details  Name: [name] [name] MRN: [id number] Date of Birth: Jul 03, 1955 Referring Provider: Dr. Nicholas Lose  Encounter Date: 07/01/2015      PT End of Session - 07/01/15 1248    Visit Number 9   Number of Visits 14   Date for PT Re-Evaluation 07/11/15   PT Start Time 0800   PT Stop Time 0844   PT Time Calculation (min) 44 min   Activity Tolerance Patient tolerated treatment well   Behavior During Therapy [facility] for tasks assessed/performed      Past Medical History  Diagnosis Date  . Paroxysmal SVT (supraventricular tachycardia) ([place]) 2004    Status post RFA ABLATION  . Cancer [facility])     right breast cancer  . Cancer of right breast ([place])   . S/P radiation therapy 12/28/2014 through 01/27/2015     Right breast 4250 cGy 17 sessions, right breast boost 1200 cGy in 6 sessions   . Dysrhythmia     no problems since ablation 2004  . Lipoma of abdominal wall     Past Surgical History  Procedure Laterality Date  . Stapedectomy Right   . Abdominal hysterectomy  1999    WITH BLADDER REPAIR FOR PROPLAPSE-HAS OVARIES (NO CA)  . Svt ablation  2000  . Knee cartilage surgery    . Breast lumpectomy with radioactive seed and sentinel lymph node biopsy Right 11/02/2014    Procedure: RIGHT BREAST LUMPECTOMY WITH RADIOACTIVE SEED AND RIGHT AXILLARY SENTINEL LYMPH NODE BIOPSY;  Surgeon: Excell Seltzer, MD;  Location: [place];  Service: General;  Laterality: Right;  . Lipoma excision N/A 03/23/2015    Procedure: EXCISION LIPOMA ABDOMINAL WALLL;  Surgeon: Excell Seltzer, MD;  Location: [place];  Service: General;  Laterality: N/A;    There were no vitals filed for this visit.  Visit Diagnosis:   Pain in joint, shoulder region, right  Stiffness of joint, shoulder region, right  Impaired function of upper extremity      Subjective Assessment - 07/01/15 0801    Subjective "It didn't ache at all after last time like the time before."   Currently in Pain? No/denies                         [facility] Adult PT Treatment/Exercise - 07/01/15 0001    Shoulder Exercises: Supine   Other Supine Exercises supine over foam roll, relaxing shoulders back on roll first, then manually assisted pect minor and horizontal abduction stretches,  right UE neural tension stretch, then active horizontal abduction x 10 and "V"  (D2) x 10, then active isometric arm extension/scapular retraction x 5   Manual Therapy   Soft tissue mobilization at area of lymph node dissection scar   Myofascial Release at right axilla, at cording palpated at anterior right axilla with arm in abduction; with pull in opposite direction, one hand on right upper arm and one at abdomen; multiple different hand placements and hand holds for these techniques   Passive ROM bilat. pect minor stretches while on foam roller   Neural Stretch right UE while on foam roller                   Short Term Clinic Goals - 06/22/15 1436    CC Short Term Goal  #1  Title Patient will report at least 30% decrease in right shoulder/chest area pain with active abductjion of right shoulder.  10% improvement reported at this time 06/22/15   Status On-going             Long Term Clinic Goals - 06/29/15 1112    CC Long Term Goal  #1   Title Patient will be knowledgeable about safe home exercise program as well as progressing to gym program both for right shoulder and general conditioning.   Status Achieved   CC Long Term Goal  #2   Title Patient will report at least 65% decrease in right shoulder/chest pain when she abducts shoulder actively through full ROM.   Baseline reports 50% less pain during this motion as of  06/29/15, but still with pain when stretches to end range   Status Partially Met            Plan - 07/01/15 1248    Clinical Impression Statement Patient noticed this morning that she had put her hands overhead when she woke up in bed and hadn't had pain from that; did have pain with another motion.  She felt stretch in the problem area where she get pulling in right upper outer breast with manual techniques today.  She felt her back felt good after being on foam roller for some exercise.   Pt will benefit from skilled therapeutic intervention in order to improve on the following deficits Increased fascial restricitons;Pain;Impaired UE functional use;Decreased activity tolerance   Rehab Potential Excellent   Clinical Impairments Affecting Rehab Potential past back injury with some difficutly with moving in and out of positions    PT Frequency 3x / week   PT Duration 2 weeks   PT Treatment/Interventions Passive range of motion;Manual techniques   PT Next Visit Plan focus again on cording at right axilla and right upper outer breast area of pulling and tightness with manual techniques   Consulted and Agree with Plan of Care Patient        Problem List Patient Active Problem List   Diagnosis Date Noted  . Primary cancer of upper outer quadrant of right breast ([place]) 10/22/2014  . Palpitations 12/25/2012  . History: SVT (supraventricular tachycardia), status post RFA ablation 12/25/2012    [name],[name] 07/01/2015, 12:54 PM  [place], [place], [address] Phone: [phone]   Fax:  [phone]  Name: [name] [name] MRN: [id number] Date of Birth: 24-Jul-1955    [name], PT 07/01/2015 12:54 PM

## 2015-07-04 ENCOUNTER — Ambulatory Visit: Payer: 59 | Admitting: Physical Therapy

## 2015-07-04 DIAGNOSIS — R6889 Other general symptoms and signs: Secondary | ICD-10-CM

## 2015-07-04 DIAGNOSIS — M25511 Pain in right shoulder: Secondary | ICD-10-CM | POA: Diagnosis not present

## 2015-07-04 DIAGNOSIS — M25611 Stiffness of right shoulder, not elsewhere classified: Secondary | ICD-10-CM

## 2015-07-04 NOTE — Therapy (Signed)
[place], [place], [address] Phone: [phone]   Fax:  [phone]  Physical Therapy Treatment  Patient Details  Name: [name] [name] MRN: [id number] Date of Birth: [birth year] Referring Provider: Dr. Nicholas Lose  Encounter Date: 07/04/2015      PT End of Session - 07/04/15 1714    Visit Number 10   Number of Visits 14   Date for PT Re-Evaluation 07/11/15   PT Start Time 1520   PT Stop Time 1605   PT Time Calculation (min) 45 min   Activity Tolerance Patient tolerated treatment well   Behavior During Therapy [facility] for tasks assessed/performed      Past Medical History  Diagnosis Date  . Paroxysmal SVT (supraventricular tachycardia) ([place]) 2004    Status post RFA ABLATION  . Cancer [facility])     right breast cancer  . Cancer of right breast ([place])   . S/P radiation therapy 12/28/2014 through 01/27/2015     Right breast 4250 cGy 17 sessions, right breast boost 1200 cGy in 6 sessions   . Dysrhythmia     no problems since ablation 2004  . Lipoma of abdominal wall     Past Surgical History  Procedure Laterality Date  . Stapedectomy Right   . Abdominal hysterectomy  1999    WITH BLADDER REPAIR FOR PROPLAPSE-HAS OVARIES (NO CA)  . Svt ablation  2000  . Knee cartilage surgery    . Breast lumpectomy with radioactive seed and sentinel lymph node biopsy Right 11/02/2014    Procedure: RIGHT BREAST LUMPECTOMY WITH RADIOACTIVE SEED AND RIGHT AXILLARY SENTINEL LYMPH NODE BIOPSY;  Surgeon: Excell Seltzer, MD;  Location: [place];  Service: General;  Laterality: Right;  . Lipoma excision N/A 03/23/2015    Procedure: EXCISION LIPOMA ABDOMINAL WALLL;  Surgeon: Excell Seltzer, MD;  Location: [place];  Service: General;  Laterality: N/A;    There were no vitals filed for this visit.  Visit Diagnosis:   Pain in joint, shoulder region, right  Stiffness of joint, shoulder region, right  Impaired function of upper extremity      Subjective Assessment - 07/04/15 1522    Subjective Did aerobics and swimming and swimming this morning, but I think I'll change to the machines.  Did a lot of lunges in aerobics, and my knees can't take it any more."  Didn't ache at all after last visit here, felt good.   Currently in Pain? Yes   Pain Location Other (Comment)  knees and back   Pain Descriptors / Indicators Other (Comment)  "more uncomfortable than pain"   Aggravating Factors  transitional movements                         [facility] Adult PT Treatment/Exercise - 07/04/15 0001    Shoulder Exercises: Supine   Other Supine Exercises supine over foam roller, horizontal abduction x 10, "V" x 10; "melt" around the foam roller with shoulders   Manual Therapy   Soft tissue mobilization at area of lymph node dissection scar   Myofascial Release at right axilla, at cording palpated at anterior right axilla with arm in abduction; with pull in opposite direction, one hand on right upper arm and one at abdomen; multiple different hand placements and hand holds for these techniques; over foam roller, from right axilla toward left abdomen, pulling on cord    Passive ROM bilat. pect minor stretches while on foam  roller   Neural Stretch right UE at various degrees of shoulder abduction                   Short Term Clinic Goals - 06/22/15 1436    CC Short Term Goal  #1   Title Patient will report at least 30% decrease in right shoulder/chest area pain with active abductjion of right shoulder.  10% improvement reported at this time 06/22/15   Status On-going             Long Term Clinic Goals - 06/29/15 1112    CC Long Term Goal  #1   Title Patient will be knowledgeable about safe home exercise program as well as progressing to gym program both for right shoulder and general  conditioning.   Status Achieved   CC Long Term Goal  #2   Title Patient will report at least 65% decrease in right shoulder/chest pain when she abducts shoulder actively through full ROM.   Baseline reports 50% less pain during this motion as of 06/29/15, but still with pain when stretches to end range   Status Partially Met            Plan - 07/04/15 1715    Clinical Impression Statement Continues to get stretching in the area where she has felt the pulling, when we do manual techniques, so seems to be benefitting.   Pt will benefit from skilled therapeutic intervention in order to improve on the following deficits Increased fascial restricitons;Pain;Impaired UE functional use;Decreased activity tolerance   Rehab Potential Excellent   Clinical Impairments Affecting Rehab Potential past back injury with some difficutly with moving in and out of positions    PT Frequency 3x / week   PT Duration 2 weeks   PT Treatment/Interventions Passive range of motion;Manual techniques   PT Next Visit Plan reassess; focus again on cording at right axilla and right upper outer breast area of pulling and tightness with manual techniques;  patient will go out of the country next week on 07/12/15   Consulted and Agree with Plan of Care Patient        Problem List Patient Active Problem List   Diagnosis Date Noted  . Primary cancer of upper outer quadrant of right breast ([place]) 10/22/2014  . Palpitations 12/25/2012  . History: SVT (supraventricular tachycardia), status post RFA ablation 12/25/2012    Arshawn Valdez 07/04/2015, 5:17 PM  [place], [place], [address] Phone: [phone]   Fax:  [phone]  Name: [name] [name] MRN: [id number] Date of Birth: 1955/12/08    [name], PT 07/04/2015 5:17 PM

## 2015-07-06 ENCOUNTER — Ambulatory Visit: Payer: 59

## 2015-07-06 DIAGNOSIS — M25511 Pain in right shoulder: Secondary | ICD-10-CM | POA: Diagnosis not present

## 2015-07-06 DIAGNOSIS — R6889 Other general symptoms and signs: Secondary | ICD-10-CM

## 2015-07-06 DIAGNOSIS — M25611 Stiffness of right shoulder, not elsewhere classified: Secondary | ICD-10-CM

## 2015-07-06 NOTE — Therapy (Signed)
[place], [place], [address] Phone: [phone]   Fax:  [phone]  Physical Therapy Treatment  Patient Details  Name: [name] [name] MRN: [id number] Date of Birth: [birth year] Referring Provider: Dr. Nicholas Lose  Encounter Date: 07/06/2015      PT End of Session - 07/06/15 1529    Visit Number 11   Number of Visits 14   Date for PT Re-Evaluation 07/11/15   PT Start Time 7672   PT Stop Time 1518   PT Time Calculation (min) 44 min   Activity Tolerance Patient tolerated treatment well   Behavior During Therapy [facility] for tasks assessed/performed      Past Medical History  Diagnosis Date  . Paroxysmal SVT (supraventricular tachycardia) ([place]) 2004    Status post RFA ABLATION  . Cancer [facility])     right breast cancer  . Cancer of right breast ([place])   . S/P radiation therapy 12/28/2014 through 01/27/2015     Right breast 4250 cGy 17 sessions, right breast boost 1200 cGy in 6 sessions   . Dysrhythmia     no problems since ablation 2004  . Lipoma of abdominal wall     Past Surgical History  Procedure Laterality Date  . Stapedectomy Right   . Abdominal hysterectomy  1999    WITH BLADDER REPAIR FOR PROPLAPSE-HAS OVARIES (NO CA)  . Svt ablation  2000  . Knee cartilage surgery    . Breast lumpectomy with radioactive seed and sentinel lymph node biopsy Right 11/02/2014    Procedure: RIGHT BREAST LUMPECTOMY WITH RADIOACTIVE SEED AND RIGHT AXILLARY SENTINEL LYMPH NODE BIOPSY;  Surgeon: Excell Seltzer, MD;  Location: [facility];  Service: General;  Laterality: Right;  . Lipoma excision N/A 03/23/2015    Procedure: EXCISION LIPOMA ABDOMINAL WALLL;  Surgeon: Excell Seltzer, MD;  Location: [facility];  Service: General;  Laterality: N/A;    There were no vitals filed for this visit.  Visit Diagnosis:   Pain in joint, shoulder region, right  Stiffness of joint, shoulder region, right  Impaired function of upper extremity      Subjective Assessment - 07/06/15 1437    Subjective Nothing new since Mondays visit.    Pertinent History Diagnosed with right breast cancer in approx. May 2016; had lumpectomy in June 2016 with three nodes removed (all negative); had radiation treatment that she completed in early September 2016.  On anastrozole.  No other health issues. h/o low back problems at L4-5 from an accident 30 years ago which flaresup off and on.   Patient Stated Goals Try and stretch out the right shoulder a bit.   Currently in Pain? No/denies                         [facility] Adult PT Treatment/Exercise - 07/06/15 0001    Manual Therapy   Soft tissue mobilization at area of lymph node dissection scar   Myofascial Release at right axilla, at cording palpated at anterior right axilla with arm in abduction; with pull in opposite direction, one hand on right upper arm and one at abdomen; multiple different hand placements and hand holds for these techniques; over foam roller, from right axilla toward left abdomen, pulling on cord    Passive ROM bilat. pect minor stretches while on foam roller   Neural Stretch right UE at various degrees of shoulder abduction  PT Education - 07/06/15 1519    Education provided Yes   Education Details In doorway holding door frame with Rt UE in end range diagonol stretch and then turn away from door frame, then can do myofascial release over pectoralis tendon in this position.    Person(s) Educated Patient   Methods Explanation;Demonstration   Comprehension Verbalized understanding;Returned demonstration           Short Term Clinic Goals - 06/22/15 1436    CC Short Term Goal  #1   Title Patient will report at least 30% decrease in right shoulder/chest area pain with active abductjion of right shoulder.  10%  improvement reported at this time 06/22/15   Status On-going             Long Term Clinic Goals - 06/29/15 1112    CC Long Term Goal  #1   Title Patient will be knowledgeable about safe home exercise program as well as progressing to gym program both for right shoulder and general conditioning.   Status Achieved   CC Long Term Goal  #2   Title Patient will report at least 65% decrease in right shoulder/chest pain when she abducts shoulder actively through full ROM.   Baseline reports 50% less pain during this motion as of 06/29/15, but still with pain when stretches to end range   Status Partially Met            Plan - 07/06/15 1530    Clinical Impression Statement Pt continues to report benefit of passive stretching and overall has been able to tell an improvement with the end range stretching on her own and with ADLs. Pt will benefit from continued therapy until she leaves for trip next week.    Pt will benefit from skilled therapeutic intervention in order to improve on the following deficits Increased fascial restricitons;Pain;Impaired UE functional use;Decreased activity tolerance   Rehab Potential Excellent   Clinical Impairments Affecting Rehab Potential past back injury with some difficutly with moving in and out of positions    PT Frequency 3x / week   PT Duration 2 weeks   PT Treatment/Interventions Passive range of motion;Manual techniques   PT Next Visit Plan reassess; focus again on cording at right axilla and right upper outer breast area of pulling and tightness with manual techniques;  patient will go out of the country next week on 07/12/15   Consulted and Agree with Plan of Care Patient        Problem List Patient Active Problem List   Diagnosis Date Noted  . Primary cancer of upper outer quadrant of right breast ([place]) 10/22/2014  . Palpitations 12/25/2012  . History: SVT (supraventricular tachycardia), status post RFA ablation 12/25/2012    Otelia Limes, PTA 07/06/2015, 3:34 PM  [place], [place], [address] Phone: [phone]   Fax:  [phone]  Name: [name] [name] MRN: [id number] Date of Birth: 1955/10/30

## 2015-07-08 ENCOUNTER — Ambulatory Visit: Payer: 59 | Admitting: Physical Therapy

## 2015-07-08 DIAGNOSIS — M25511 Pain in right shoulder: Secondary | ICD-10-CM

## 2015-07-08 DIAGNOSIS — R6889 Other general symptoms and signs: Secondary | ICD-10-CM

## 2015-07-08 DIAGNOSIS — M25611 Stiffness of right shoulder, not elsewhere classified: Secondary | ICD-10-CM

## 2015-07-08 NOTE — Therapy (Signed)
[place], [place], [address] Phone: [phone]   Fax:  [phone]  Physical Therapy Treatment  Patient Details  Name: [name] [name] MRN: [id number] Date of Birth: 09-Apr-1956 Referring Provider: Dr. Nicholas Lose  Encounter Date: 07/08/2015      PT End of Session - 07/08/15 1223    Visit Number 12   Number of Visits 14   Date for PT Re-Evaluation 07/11/15   PT Start Time 0800   PT Stop Time 0847   PT Time Calculation (min) 47 min   Activity Tolerance Patient tolerated treatment well   Behavior During Therapy [facility] for tasks assessed/performed      Past Medical History  Diagnosis Date  . Paroxysmal SVT (supraventricular tachycardia) ([place]) 2004    Status post RFA ABLATION  . Cancer [facility])     right breast cancer  . Cancer of right breast ([place])   . S/P radiation therapy 12/28/2014 through 01/27/2015     Right breast 4250 cGy 17 sessions, right breast boost 1200 cGy in 6 sessions   . Dysrhythmia     no problems since ablation 2004  . Lipoma of abdominal wall     Past Surgical History  Procedure Laterality Date  . Stapedectomy Right   . Abdominal hysterectomy  1999    WITH BLADDER REPAIR FOR PROPLAPSE-HAS OVARIES (NO CA)  . Svt ablation  2000  . Knee cartilage surgery    . Breast lumpectomy with radioactive seed and sentinel lymph node biopsy Right 11/02/2014    Procedure: RIGHT BREAST LUMPECTOMY WITH RADIOACTIVE SEED AND RIGHT AXILLARY SENTINEL LYMPH NODE BIOPSY;  Surgeon: Excell Seltzer, MD;  Location: [facility] [place];  Service: General;  Laterality: Right;  . Lipoma excision N/A 03/23/2015    Procedure: EXCISION LIPOMA ABDOMINAL WALLL;  Surgeon: Excell Seltzer, MD;  Location: [place];  Service: General;  Laterality: N/A;    There were no vitals filed for this visit.  Visit Diagnosis:   Pain in joint, shoulder region, right  Stiffness of joint, shoulder region, right  Impaired function of upper extremity      Subjective Assessment - 07/08/15 0802    Subjective Nothing really new.   Currently in Pain? No/denies                         [facility] Adult PT Treatment/Exercise - 07/08/15 0001    Self-Care   Other Self-Care Comments  showed patient Research officer, political party at other gym and therapist there taught her about it   Shoulder Exercises: Supine   Other Supine Exercises supine over foam roller, horizontal abduction x 10, "V" x 10; "melt" around the foam roller with shoulders   Manual Therapy   Soft tissue mobilization at area of lymph node dissection scar   Myofascial Release at right axilla, at cording palpated at anterior right axilla with arm in abduction; with pull in opposite direction, one hand on right upper arm and one at abdomen; multiple different hand placements and hand holds for these techniques; over foam roller, from right axilla toward left abdomen, pulling on cord    Passive ROM bilat. pect minor stretches while on foam roller   Neural Stretch right UE at various degrees of shoulder abduction                   Short Term Clinic Goals - 07/08/15 0804    CC Short Term Goal  #1  Title Patient will report at least 30% decrease in right shoulder/chest area pain with active abductjion of right shoulder.   Status Achieved             Long Term Clinic Goals - 07/08/15 6759    CC Long Term Goal  #1   Title Patient will be knowledgeable about safe home exercise program as well as progressing to gym program both for right shoulder and general conditioning.   Status Achieved   CC Long Term Goal  #2   Title Patient will report at least 65% decrease in right shoulder/chest pain when she abducts shoulder actively through full ROM.   Baseline 60% better as of 07/08/15   Status Achieved            Plan - 07/08/15 1224     Clinical Impression Statement Patient now reports 60% decreased discomfort with abduction of her right arm, so goal set for that was met.  She feels she has HEP that will help her while she is out of the country to continue to stretch tight areas, and she is motivated to do so.  Today she was able to learn more about Pilates and see the reformer, so she may do some of this when she returns.   Pt will benefit from skilled therapeutic intervention in order to improve on the following deficits Increased fascial restricitons;Pain;Impaired UE functional use;Decreased activity tolerance   Rehab Potential Excellent   Clinical Impairments Affecting Rehab Potential past back injury with some difficutly with moving in and out of positions    PT Frequency 3x / week   PT Duration 2 weeks   PT Treatment/Interventions Passive range of motion;Manual techniques   PT Next Visit Plan focus again on cording at right axilla and right upper outer breast area of pulling and tightness with manual techniques;  patient will go out of the country on 07/12/15 for two months.  She would like to return to therapy once she is back, so will need a renewal for that.   Consulted and Agree with Plan of Care Patient        Problem List Patient Active Problem List   Diagnosis Date Noted  . Primary cancer of upper outer quadrant of right breast ([place]) 10/22/2014  . Palpitations 12/25/2012  . History: SVT (supraventricular tachycardia), status post RFA ablation 12/25/2012    [name] [name] 07/08/2015, 12:27 PM  [place], [place], [address] Phone: [phone]   Fax:  [phone]  Name: [name] [name] MRN: [id number] Date of Birth: 1956/02/05    [name], PT 07/08/2015 12:27 PM

## 2015-07-11 ENCOUNTER — Ambulatory Visit: Payer: 59 | Admitting: Physical Therapy

## 2015-07-11 DIAGNOSIS — M25511 Pain in right shoulder: Secondary | ICD-10-CM | POA: Diagnosis not present

## 2015-07-11 NOTE — Therapy (Signed)
[place], [place], [address] Phone: [phone]   Fax:  [phone]  Physical Therapy Treatment  Patient Details  Name: [name] [name] MRN: [id number] Date of Birth: Feb 06, 1956 Referring Provider: Dr. Nicholas Lose  Encounter Date: 07/11/2015      PT End of Session - 07/11/15 2017    Visit Number 13   Number of Visits 14   Date for PT Re-Evaluation 07/11/15   PT Start Time 1303   PT Stop Time 1346   PT Time Calculation (min) 43 min      Past Medical History  Diagnosis Date  . Paroxysmal SVT (supraventricular tachycardia) ([place]) 2004    Status post RFA ABLATION  . Cancer [facility])     right breast cancer  . Cancer of right breast ([place])   . S/P radiation therapy 12/28/2014 through 01/27/2015     Right breast 4250 cGy 17 sessions, right breast boost 1200 cGy in 6 sessions   . Dysrhythmia     no problems since ablation 2004  . Lipoma of abdominal wall     Past Surgical History  Procedure Laterality Date  . Stapedectomy Right   . Abdominal hysterectomy  1999    WITH BLADDER REPAIR FOR PROPLAPSE-HAS OVARIES (NO CA)  . Svt ablation  2000  . Knee cartilage surgery    . Breast lumpectomy with radioactive seed and sentinel lymph node biopsy Right 11/02/2014    Procedure: RIGHT BREAST LUMPECTOMY WITH RADIOACTIVE SEED AND RIGHT AXILLARY SENTINEL LYMPH NODE BIOPSY;  Surgeon: Excell Seltzer, MD;  Location: [facility];  Service: General;  Laterality: Right;  . Lipoma excision N/A 03/23/2015    Procedure: EXCISION LIPOMA ABDOMINAL WALLL;  Surgeon: Excell Seltzer, MD;  Location: [place];  Service: General;  Laterality: N/A;    There were no vitals filed for this visit.  Visit Diagnosis:  Pain in joint, shoulder region, right  Stiffness of joint, shoulder region, right  Impaired function of upper  extremity      Subjective Assessment - 07/11/15 1303    Subjective All good with her body.   Currently in Pain? No/denies                         [facility] Adult PT Treatment/Exercise - 07/11/15 0001    Manual Therapy   Soft tissue mobilization at area of lymph node dissection scar   Myofascial Release at right axilla, at cording palpated at anterior right axilla with arm in abduction; with pull in opposite direction, one hand on right upper arm and one at abdomen; multiple different hand placements and hand holds for these techniques   Neural Stretch right UE                   Short Term Clinic Goals - 07/08/15 [id number]    CC Short Term Goal  #1   Title Patient will report at least 30% decrease in right shoulder/chest area pain with active abductjion of right shoulder.   Status Achieved             Long Term Clinic Goals - 07/08/15 [id number]    CC Long Term Goal  #1   Title Patient will be knowledgeable about safe home exercise program as well as progressing to gym program both for right shoulder and general conditioning.   Status Achieved   CC Long Term Goal  #2   Title Patient will report  at least 65% decrease in right shoulder/chest pain when she abducts shoulder actively through full ROM.   Baseline 60% better as of 07/08/15   Status Achieved            Plan - 07/11/15 2018    Clinical Impression Statement Patient seems pleased with her progress and feels she has enough to work on while she is away in [place] the next two months.  She plans to continue therapy after her return.  She would like to continue with soft tissue mobilization and myofascial release, but also to work with Pilates therapists oncore work to decrease low back pain.   Pt will benefit from skilled therapeutic intervention in order to improve on the following deficits Increased fascial restricitons;Pain;Impaired UE functional use;Decreased activity tolerance   Rehab Potential  Excellent   Clinical Impairments Affecting Rehab Potential past back injury with some difficutly with moving in and out of positions    PT Frequency 3x / week   PT Duration 2 weeks   PT Treatment/Interventions Passive range of motion;Manual techniques;Scar mobilization;ADLs/Self Care Home Management   PT Next Visit Plan focus again on cording at right axilla and right upper outer breast area of pulling and tightness with manual techniques;  patient will go out of the country on 07/12/15 for two months.  She would like to return to therapy once she is back, so will need a renewal for that.   PT Home Exercise Plan stretching and strengthening   Consulted and Agree with Plan of Care Patient        Problem List Patient Active Problem List   Diagnosis Date Noted  . Primary cancer of upper outer quadrant of right breast ([place]) 10/22/2014  . Palpitations 12/25/2012  . History: SVT (supraventricular tachycardia), status post RFA ablation 12/25/2012    Jahmire Ruffins 07/11/2015, 8:25 PM  [place], [place], [address] Phone: [phone]   Fax:  [phone]  Name: [name] [name] MRN: [id number] Date of Birth: [birth year]    [name], PT 07/11/2015 8:25 PM

## 2015-09-12 ENCOUNTER — Ambulatory Visit: Payer: 59 | Admitting: Physical Therapy

## 2015-09-16 ENCOUNTER — Ambulatory Visit: Payer: 59 | Attending: Hematology and Oncology | Admitting: Physical Therapy

## 2015-09-16 DIAGNOSIS — M25511 Pain in right shoulder: Secondary | ICD-10-CM | POA: Diagnosis present

## 2015-09-16 DIAGNOSIS — M25611 Stiffness of right shoulder, not elsewhere classified: Secondary | ICD-10-CM | POA: Insufficient documentation

## 2015-09-16 NOTE — Therapy (Signed)
[place], [place], [address] Phone: [phone]   Fax:  [phone]  Physical Therapy Treatment  Patient Details  Name: [name] [name] MRN: [id number] Date of Birth: April 16, 1956 Referring Provider: Dr. Nicholas Lose  Encounter Date: 09/16/2015      PT End of Session - 09/16/15 1210    Visit Number 14   Number of Visits 22   Date for PT Re-Evaluation 10/26/15   PT Start Time 1110   PT Stop Time 1153   PT Time Calculation (min) 43 min   Activity Tolerance Patient tolerated treatment well   Behavior During Therapy [facility] for tasks assessed/performed      Past Medical History  Diagnosis Date  . Paroxysmal SVT (supraventricular tachycardia) ([place]) 2004    Status post RFA ABLATION  . Cancer [facility])     right breast cancer  . Cancer of right breast ([place])   . S/P radiation therapy 12/28/2014 through 01/27/2015     Right breast 4250 cGy 17 sessions, right breast boost 1200 cGy in 6 sessions   . Dysrhythmia     no problems since ablation 2004  . Lipoma of abdominal wall     Past Surgical History  Procedure Laterality Date  . Stapedectomy Right   . Abdominal hysterectomy  1999    WITH BLADDER REPAIR FOR PROPLAPSE-HAS OVARIES (NO CA)  . Svt ablation  2000  . Knee cartilage surgery    . Breast lumpectomy with radioactive seed and sentinel lymph node biopsy Right 11/02/2014    Procedure: RIGHT BREAST LUMPECTOMY WITH RADIOACTIVE SEED AND RIGHT AXILLARY SENTINEL LYMPH NODE BIOPSY;  Surgeon: Excell Seltzer, MD;  Location: [place];  Service: General;  Laterality: Right;  . Lipoma excision N/A 03/23/2015    Procedure: EXCISION LIPOMA ABDOMINAL WALLL;  Surgeon: Excell Seltzer, MD;  Location: [place];  Service: General;  Laterality: N/A;    There were no vitals filed for this visit.      Subjective  Assessment - 09/16/15 1111    Subjective "I was extremely slack with my homework while I was away for 8 weeks, so it's still stiff."  Saw the surgeon last week and he said it could be a couple of years.  Was sick toward the end of her trip; it's a respiratory problem, with coughing and wheezing.  [facility]   Currently in Pain? No/denies            [facility] PT Assessment - 09/16/15 0001    AROM   Right Shoulder Extension 60 Degrees   Right Shoulder Flexion 162 Degrees  no discomfort   Right Shoulder ABduction 186 Degrees  painful arc, but pain in upper outer breast   Right Shoulder Internal Rotation --  [facility]   Right Shoulder External Rotation 90 Degrees                     [facility] Adult PT Treatment/Exercise - 09/16/15 0001    Manual Therapy   Soft tissue mobilization at area of lymph node dissection scar and at right upper outer breast area of tightness   Myofascial Release crosshands across axilla, with one hand on right upper arm and other hand at abdomen in opposite direction; right UE myofascial pulling in supine and sidelying   Scapular Mobilization to right scapula in left sidelying  resistance felt to protraction; tight in depression   Passive ROM Right shoulder  Short Term Clinic Goals - 07/08/15 [id number]    CC Short Term Goal  #1   Title Patient will report at least 30% decrease in right shoulder/chest area pain with active abductjion of right shoulder.   Status Achieved             Long Term Clinic Goals - 09/16/15 1218    CC Long Term Goal  #1   Title Patient will be knowledgeable about safe home exercise program as well as progressing to gym program both for right shoulder and general conditioning.   Status Achieved   CC Long Term Goal  #2   Title Patient will report at least 65% decrease in right shoulder/chest pain when she abducts shoulder actively through full ROM.   Status Achieved   CC Long Term  Goal  #3   Title Quick DASH score will reduce to 8 or below indicating functional gains.   Status On-going            Plan - 09/16/15 1211    Clinical Impression Statement Patient has returned from two months in [place] reporting that she was slack on doing her stretches while away.  Still, her right shoulder ROM has improved a little.  She continues to have pain at right upper outer breast in mid-range of active abduction, and she has tenderness to palpation of a point there.  She reported feeling looser after session today.  She is not sure about whether she wants to pursue Pilates for low back pain.   Rehab Potential Excellent   Clinical Impairments Affecting Rehab Potential past back injury with some difficutly with moving in and out of positions    PT Frequency 2x / week   PT Duration 4 weeks   PT Treatment/Interventions Passive range of motion;Manual techniques;Scar mobilization;ADLs/Self Care Home Management;Electrical Stimulation;DME Instruction;Therapeutic activities;Therapeutic exercise;Patient/family education;Manual lymph drainage;Taping   PT Next Visit Plan repeat quick DASH; discuss with patient what her goals are now that she has returned from her trip; continue manual techniques and work on pain reduction   PT Home Exercise Plan stretching and strengthening   Consulted and Agree with Plan of Care Patient      Patient will benefit from skilled therapeutic intervention in order to improve the following deficits and impairments:  Increased fascial restricitons, Pain, Impaired UE functional use, Decreased activity tolerance  Visit Diagnosis: Stiffness of right shoulder, not elsewhere classified - Plan: PT plan of care cert/re-cert  Pain in right shoulder - Plan: PT plan of care cert/re-cert     Problem List Patient Active Problem List   Diagnosis Date Noted  . Primary cancer of upper outer quadrant of right breast ([place]) 10/22/2014  . Palpitations 12/25/2012  .  History: SVT (supraventricular tachycardia), status post RFA ablation 12/25/2012    Jae Bruck 09/16/2015, 12:22 PM  [place], [place], [address] Phone: [phone]   Fax:  [phone]  Name: Nasha Servantez MRN: :[id number] Date of Birth: January 05, 1956    [name], PT 09/16/2015 12:22 PM

## 2015-09-19 ENCOUNTER — Ambulatory Visit: Payer: 59 | Admitting: Physical Therapy

## 2015-09-19 DIAGNOSIS — M25611 Stiffness of right shoulder, not elsewhere classified: Secondary | ICD-10-CM

## 2015-09-19 DIAGNOSIS — M25511 Pain in right shoulder: Secondary | ICD-10-CM

## 2015-09-19 NOTE — Therapy (Signed)
[place], [place], [address] Phone: [phone]   Fax:  [phone]  Physical Therapy Treatment  Patient Details  Name: [name] [name] MRN: [id number] Date of Birth: Jul 28, 1955 Referring Provider: Dr. Nicholas Lose  Encounter Date: 09/19/2015      PT End of Session - 09/19/15 1259    Visit Number 15   Number of Visits 22   Date for PT Re-Evaluation 10/26/15   PT Start Time 1107   PT Stop Time 1155   PT Time Calculation (min) 48 min   Activity Tolerance Patient tolerated treatment well   Behavior During Therapy [facility] for tasks assessed/performed      Past Medical History  Diagnosis Date  . Paroxysmal SVT (supraventricular tachycardia) ([place]) 2004    Status post RFA ABLATION  . Cancer [facility])     right breast cancer  . Cancer of right breast ([place])   . S/P radiation therapy 12/28/2014 through 01/27/2015     Right breast 4250 cGy 17 sessions, right breast boost 1200 cGy in 6 sessions   . Dysrhythmia     no problems since ablation 2004  . Lipoma of abdominal wall     Past Surgical History  Procedure Laterality Date  . Stapedectomy Right   . Abdominal hysterectomy  1999    WITH BLADDER REPAIR FOR PROPLAPSE-HAS OVARIES (NO CA)  . Svt ablation  2000  . Knee cartilage surgery    . Breast lumpectomy with radioactive seed and sentinel lymph node biopsy Right 11/02/2014    Procedure: RIGHT BREAST LUMPECTOMY WITH RADIOACTIVE SEED AND RIGHT AXILLARY SENTINEL LYMPH NODE BIOPSY;  Surgeon: Excell Seltzer, MD;  Location: [place];  Service: General;  Laterality: Right;  . Lipoma excision N/A 03/23/2015    Procedure: EXCISION LIPOMA ABDOMINAL WALLL;  Surgeon: Excell Seltzer, MD;  Location: [place];  Service: General;  Laterality: N/A;    There were no vitals filed for this visit.      Subjective  Assessment - 09/19/15 1111    Subjective Nothing new today.  No extra soreness from the last visit; I think it is a bit looser.   Currently in Pain? No/denies                         [facility] Adult PT Treatment/Exercise - 09/19/15 0001    Manual Therapy   Soft tissue mobilization at area of lymph node dissection scar and at right upper outer breast area of tightness   Myofascial Release crosshands across axilla, with one hand on right upper arm and other hand at abdomen in opposite direction; right UE myofascial pulling in supine and sidelying   Scapular Mobilization to right scapula in left sidelying  resistance felt to protraction; tight in depression   Passive ROM Right shoulder ; supine over towel roll pect minor stretches                   Short Term Clinic Goals - 07/08/15 [id number]    CC Short Term Goal  #1   Title Patient will report at least 30% decrease in right shoulder/chest area pain with active abductjion of right shoulder.   Status Achieved             Long Term Clinic Goals - 09/16/15 1218    CC Long Term Goal  #1   Title Patient will be knowledgeable about safe home exercise program as well as  progressing to gym program both for right shoulder and general conditioning.   Status Achieved   CC Long Term Goal  #2   Title Patient will report at least 65% decrease in right shoulder/chest pain when she abducts shoulder actively through full ROM.   Status Achieved   CC Long Term Goal  #3   Title Quick DASH score will reduce to 8 or below indicating functional gains.   Status On-going            Plan - 09/19/15 1300    Clinical Impression Statement Patient reported feeling loose, less discomfort at end of session.   Rehab Potential Excellent   Clinical Impairments Affecting Rehab Potential past back injury with some difficutly with moving in and out of positions    PT Frequency 2x / week   PT Duration 4 weeks   PT Treatment/Interventions  Passive range of motion;Manual techniques;Scar mobilization;ADLs/Self Care Home Management;Electrical Stimulation;DME Instruction;Therapeutic activities;Therapeutic exercise;Patient/family education;Manual lymph drainage;Taping   PT Next Visit Plan repeat quick DASH; discuss with patient what her goals are now that she has returned from her trip; continue manual techniques and work on pain reduction   Consulted and Agree with Plan of Care Patient      Patient will benefit from skilled therapeutic intervention in order to improve the following deficits and impairments:  Increased fascial restricitons, Pain, Impaired UE functional use, Decreased activity tolerance  Visit Diagnosis: Stiffness of right shoulder, not elsewhere classified  Pain in right shoulder     Problem List Patient Active Problem List   Diagnosis Date Noted  . Primary cancer of upper outer quadrant of right breast ([place]) 10/22/2014  . Palpitations 12/25/2012  . History: SVT (supraventricular tachycardia), status post RFA ablation 12/25/2012    [name],[name] 09/19/2015, 1:02 PM  [place], [place], [address] Phone: [phone]   Fax:  [phone]  Name: [name] [name] MRN: [place] [id number] Date of Birth: [birth year]    [name], PT 09/19/2015 1:02 PM

## 2015-09-23 ENCOUNTER — Ambulatory Visit: Payer: 59 | Admitting: Physical Therapy

## 2015-09-23 DIAGNOSIS — M25611 Stiffness of right shoulder, not elsewhere classified: Secondary | ICD-10-CM

## 2015-09-23 DIAGNOSIS — M25511 Pain in right shoulder: Secondary | ICD-10-CM

## 2015-09-23 NOTE — Therapy (Signed)
[place], [place], [address] Phone: [phone]   Fax:  [phone]  Physical Therapy Treatment  Patient Details  Name: [name] [name] MRN: [id number] Date of Birth: 1955-11-02 Referring Provider: Dr. Nicholas Lose  Encounter Date: 09/23/2015      PT End of Session - 09/23/15 1219    Visit Number 16   PT Start Time 1108   PT Stop Time 1153   PT Time Calculation (min) 45 min   Activity Tolerance Patient tolerated treatment well   Behavior During Therapy [facility] for tasks assessed/performed      Past Medical History  Diagnosis Date  . Paroxysmal SVT (supraventricular tachycardia) ([place]) 2004    Status post RFA ABLATION  . Cancer [facility])     right breast cancer  . Cancer of right breast ([place])   . S/P radiation therapy 12/28/2014 through 01/27/2015     Right breast 4250 cGy 17 sessions, right breast boost 1200 cGy in 6 sessions   . Dysrhythmia     no problems since ablation 2004  . Lipoma of abdominal wall     Past Surgical History  Procedure Laterality Date  . Stapedectomy Right   . Abdominal hysterectomy  1999    WITH BLADDER REPAIR FOR PROPLAPSE-HAS OVARIES (NO CA)  . Svt ablation  2000  . Knee cartilage surgery    . Breast lumpectomy with radioactive seed and sentinel lymph node biopsy Right 11/02/2014    Procedure: RIGHT BREAST LUMPECTOMY WITH RADIOACTIVE SEED AND RIGHT AXILLARY SENTINEL LYMPH NODE BIOPSY;  Surgeon: Excell Seltzer, MD;  Location: [place];  Service: General;  Laterality: Right;  . Lipoma excision N/A 03/23/2015    Procedure: EXCISION LIPOMA ABDOMINAL WALLL;  Surgeon: Excell Seltzer, MD;  Location: [place];  Service: General;  Laterality: N/A;    There were no vitals filed for this visit.      Subjective Assessment - 09/23/15 1111    Subjective "This is my last  appointment."  Now that she's back at the gym, wants to work at that for a while, and see how it goes.   Currently in Pain? No/denies            [facility] PT Assessment - 09/23/15 0001    Observation/Other Assessments   Quick DASH  6.82              Quick Dash - 09/23/15 0001    Open a tight or new jar No difficulty   Do heavy household chores (wash walls, wash floors) Mild difficulty   Carry a shopping bag or briefcase No difficulty   Wash your back Mild difficulty   Use a knife to cut food No difficulty   Recreational activities in which you take some force or impact through your arm, shoulder, or hand (golf, hammering, tennis) No difficulty   During the past week, to what extent has your arm, shoulder or hand problem interfered with your normal social activities with family, friends, neighbors, or groups? Not at all   During the past week, to what extent has your arm, shoulder or hand problem limited your work or other regular daily activities Not at all   Arm, shoulder, or hand pain. None   Tingling (pins and needles) in your arm, shoulder, or hand None   Difficulty Sleeping Mild difficulty   DASH Score 6.82 %               [facility] Adult  PT Treatment/Exercise - 09/23/15 0001    Manual Therapy   Soft tissue mobilization at area of lymph node dissection scar and at right upper outer breast area of tightness   Myofascial Release crosshands across axilla, with one hand on right upper arm and other hand at abdomen in opposite direction; right UE myofascial pulling in supine and sidelying; crosshands in vertical and multiple diagonal directions across chest and abdomen   Scapular Mobilization to right scapula in left sidelying  resistance felt to protraction; tight in depression   Passive ROM Right shoulder                    Short Term Clinic Goals - 07/08/15 7628    CC Short Term Goal  #1   Title Patient will report at least 30% decrease in right  shoulder/chest area pain with active abductjion of right shoulder.   Status Achieved             Long Term Clinic Goals - 09/23/15 1222    CC Long Term Goal  #1   Title Patient will be knowledgeable about safe home exercise program as well as progressing to gym program both for right shoulder and general conditioning.   Status Achieved   CC Long Term Goal  #2   Title Patient will report at least 65% decrease in right shoulder/chest pain when she abducts shoulder actively through full ROM.   Baseline reports at least 80% improvement   Status Achieved   CC Long Term Goal  #3   Title Quick DASH score will reduce to 8 or below indicating functional gains.   Baseline 13.64 at eval; 6.82 today at discharge   Status Achieved            Plan - 09/23/15 1219    Clinical Impression Statement Patient wants to try continuing to work out and stretch on her own for a few months and see if she can manage independently. She knows she will be discharged today and will need a new prescription if she needs to return for further therapy.  She reports feeling 80% improvement to date.   Rehab Potential Excellent   PT Treatment/Interventions Manual techniques;Passive range of motion   PT Next Visit Plan None; discharge today.   PT Home Exercise [facility]   Consulted and Agree with Plan of Care Patient      Patient will benefit from skilled therapeutic intervention in order to improve the following deficits and impairments:  Increased fascial restricitons, Pain, Impaired UE functional use, Decreased activity tolerance  Visit Diagnosis: Stiffness of right shoulder, not elsewhere classified  Pain in right shoulder     Problem List Patient Active Problem List   Diagnosis Date Noted  . Primary cancer of upper outer quadrant of right breast ([place]) 10/22/2014  . Palpitations 12/25/2012  . History: SVT (supraventricular tachycardia), status post RFA  ablation 12/25/2012    [name] [name] 09/23/2015, 12:25 PM  [name], [place], [address] Phone: [phone]   Fax:  [phone]  Name: [name] [name] MRN: [id number] Date of Birth: [birth year]   PHYSICAL THERAPY DISCHARGE SUMMARY  Visits from Start of Care: 16  Current functional level related to goals / functional outcomes: All goals met as noted above.   Remaining deficits: Still with some mild discomfort at right upper outer breast.    Education / Equipment: Home exercise program, PREP program at [employer]  Y. Plan: Patient agrees to discharge.  Patient goals were partially met. Patient is being discharged due to meeting the stated rehab goals.  ?????       [name], PT 09/23/2015 12:25 PM

## 2015-11-02 ENCOUNTER — Ambulatory Visit (INDEPENDENT_AMBULATORY_CARE_PROVIDER_SITE_OTHER): Payer: 59 | Admitting: Family Medicine

## 2015-11-02 ENCOUNTER — Encounter: Payer: Self-pay | Admitting: Family Medicine

## 2015-11-02 VITALS — BP 122/72 | HR 72 | Temp 98.1°F | Resp 16 | Ht 69.0 in | Wt 180.5 lb

## 2015-11-02 DIAGNOSIS — Z23 Encounter for immunization: Secondary | ICD-10-CM | POA: Diagnosis not present

## 2015-11-02 DIAGNOSIS — Z1211 Encounter for screening for malignant neoplasm of colon: Secondary | ICD-10-CM | POA: Diagnosis not present

## 2015-11-02 DIAGNOSIS — Z0000 Encounter for general adult medical examination without abnormal findings: Secondary | ICD-10-CM

## 2015-11-02 LAB — CBC WITH DIFFERENTIAL/PLATELET
BASOS ABS: 0 10*3/uL (ref 0.0–0.1)
Basophils Relative: 0.8 % (ref 0.0–3.0)
EOS ABS: 0.1 10*3/uL (ref 0.0–0.7)
EOS PCT: 1.8 % (ref 0.0–5.0)
HCT: 39.4 % (ref 36.0–46.0)
HEMOGLOBIN: 13.3 g/dL (ref 12.0–15.0)
LYMPHS ABS: 1.7 10*3/uL (ref 0.7–4.0)
Lymphocytes Relative: 31.6 % (ref 12.0–46.0)
MCHC: 33.7 g/dL (ref 30.0–36.0)
MCV: 93.1 fl (ref 78.0–100.0)
MONO ABS: 0.7 10*3/uL (ref 0.1–1.0)
Monocytes Relative: 11.9 % (ref 3.0–12.0)
NEUTROS PCT: 53.9 % (ref 43.0–77.0)
Neutro Abs: 3 10*3/uL (ref 1.4–7.7)
Platelets: 210 10*3/uL (ref 150.0–400.0)
RBC: 4.23 Mil/uL (ref 3.87–5.11)
RDW: 13.8 % (ref 11.5–15.5)
WBC: 5.5 10*3/uL (ref 4.0–10.5)

## 2015-11-02 LAB — TSH: TSH: 0.98 u[IU]/mL (ref 0.35–4.50)

## 2015-11-02 LAB — BASIC METABOLIC PANEL
BUN: 19 mg/dL (ref 6–23)
CHLORIDE: 100 meq/L (ref 96–112)
CO2: 30 mEq/L (ref 19–32)
CREATININE: 0.68 mg/dL (ref 0.40–1.20)
Calcium: 9.6 mg/dL (ref 8.4–10.5)
GFR: 93.68 mL/min (ref >60.00)
GLUCOSE: 104 mg/dL — AB (ref 70–99)
POTASSIUM: 4.7 meq/L (ref 3.5–5.1)
Sodium: 135 mEq/L (ref 135–145)

## 2015-11-02 LAB — HEPATIC FUNCTION PANEL
ALBUMIN: 4.3 g/dL (ref 3.5–5.2)
ALK PHOS: 50 U/L (ref 39–117)
ALT: 12 U/L (ref 0–35)
AST: 15 U/L (ref 0–37)
Bilirubin, Direct: 0.1 mg/dL (ref 0.0–0.3)
TOTAL PROTEIN: 6.8 g/dL (ref 6.0–8.3)
Total Bilirubin: 0.8 mg/dL (ref 0.2–1.2)

## 2015-11-02 LAB — LIPID PANEL
CHOL/HDL RATIO: 3
CHOLESTEROL: 236 mg/dL — AB (ref 0–200)
HDL: 72.2 mg/dL (ref >39.00)
LDL CALC: 140 mg/dL — AB (ref 0–99)
NonHDL: 164.17
TRIGLYCERIDES: 119 mg/dL (ref 0.0–149.0)
VLDL: 23.8 mg/dL (ref 0.0–40.0)

## 2015-11-02 LAB — VITAMIN D 25 HYDROXY (VIT D DEFICIENCY, FRACTURES): VITD: 16.49 ng/mL — AB (ref 30.00–100.00)

## 2015-11-02 NOTE — Progress Notes (Signed)
Pre visit review using our clinic review tool, if applicable. No additional management support is needed unless otherwise documented below in the visit note. 

## 2015-11-02 NOTE — Assessment & Plan Note (Signed)
Pt's PE WNL.  Due for colonoscopy- referral placed.  Tdap updated today.  UTD on mammo.  No need for paps.  Check labs.  Anticipatory guidance provided.

## 2015-11-02 NOTE — Addendum Note (Signed)
Addended by: Davis Gourd on: 11/02/2015 10:56 AM   Modules accepted: Orders

## 2015-11-02 NOTE — Progress Notes (Signed)
   Subjective:    Patient ID: [name] [name], female    DOB: 08-09-1955, 60 y.o.   MRN: [id number]  HPI New to establish.  Previous MD- Greta Doom.  Onc- [facility]  CPE- due for repeat colonoscopy.  Last done 2007 in [place].  UTD on mammo- done last week.  No need for pap smears due to hysterectomy.  Due for Tdap.  No concerns today.   Review of Systems Patient reports no vision/ hearing changes, adenopathy,fever, weight change,  persistant/recurrent hoarseness , swallowing issues, chest pain, palpitations, edema, persistant/recurrent cough, hemoptysis, dyspnea (rest/exertional/paroxysmal nocturnal), gastrointestinal bleeding (melena, rectal bleeding), abdominal pain, significant heartburn, bowel changes, GU symptoms (dysuria, hematuria, incontinence), Gyn symptoms (abnormal  bleeding, pain),  syncope, focal weakness, memory loss, numbness & tingling, skin/hair/nail changes, abnormal bruising or bleeding, anxiety, or depression.     Objective:   Physical Exam General Appearance:    Alert, cooperative, no distress, appears stated age  Head:    Normocephalic, without obvious abnormality, atraumatic  Eyes:    PERRL, conjunctiva/corneas clear, EOM's intact, fundi    benign, both eyes  Ears:    Normal TM's and external ear canals, both ears  Nose:   Nares normal, septum midline, mucosa normal, no drainage    or sinus tenderness  Throat:   Lips, mucosa, and tongue normal; teeth and gums normal  Neck:   Supple, symmetrical, trachea midline, no adenopathy;    Thyroid: no enlargement/tenderness/nodules  Back:     Symmetric, no curvature, ROM normal, no CVA tenderness  Lungs:     Clear to auscultation bilaterally, respirations unlabored  Chest Wall:    No tenderness or deformity   Heart:    Regular rate and rhythm, S1 and S2 normal, no murmur, rub   or gallop  Breast Exam:    Deferred to GYN  Abdomen:     Soft, non-tender, bowel sounds active all four quadrants,    no masses, no organomegaly    Genitalia:    Deferred  Rectal:    Extremities:   Extremities normal, atraumatic, no cyanosis or edema  Pulses:   2+ and symmetric all extremities  Skin:   Skin color, texture, turgor normal, no rashes or lesions  Lymph nodes:   Cervical, supraclavicular, and axillary nodes normal  Neurologic:   CNII-XII intact, normal strength, sensation and reflexes    throughout          Assessment & Plan:

## 2015-11-02 NOTE — Patient Instructions (Signed)
Follow up in 1 year or as needed We'll notify you of your lab results and make any changes if needed Continue to work on healthy diet and regular exercise- you look great! Call and ask insurance about the shingles vaccine and if it is covered You will not need a Tdap for another 10 years We'll call you with your colonoscopy appt Call with any questions or concerns Welcome!  We're glad to have you!!!!

## 2015-11-03 ENCOUNTER — Other Ambulatory Visit: Payer: Self-pay | Admitting: General Practice

## 2015-11-03 MED ORDER — VITAMIN D (ERGOCALCIFEROL) 1.25 MG (50000 UNIT) PO CAPS: 50000.0000 [IU] | ORAL_CAPSULE | ORAL | Status: DC

## 2015-12-07 NOTE — Assessment & Plan Note (Signed)
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 Hot flashes 6-7 times per day She wishes to not take any more medications to treat side effects. I instructed her to try taking it at bedtime and see how many hot flashes she has. 2. Slightly more moody than usual Patient plans to go to [place] in February.  Return to clinic in 6 months for surveillance and follow-up 

## 2015-12-08 ENCOUNTER — Ambulatory Visit (HOSPITAL_BASED_OUTPATIENT_CLINIC_OR_DEPARTMENT_OTHER): Payer: 59 | Admitting: Hematology and Oncology

## 2015-12-08 ENCOUNTER — Encounter: Payer: Self-pay | Admitting: Hematology and Oncology

## 2015-12-08 ENCOUNTER — Telehealth: Payer: Self-pay | Admitting: Hematology and Oncology

## 2015-12-08 VITALS — BP 126/88 | HR 67 | Temp 98.1°F | Resp 18 | Ht 69.0 in | Wt 180.9 lb

## 2015-12-08 DIAGNOSIS — C50411 Malignant neoplasm of upper-outer quadrant of right female breast: Secondary | ICD-10-CM

## 2015-12-08 NOTE — Progress Notes (Signed)
Patient Care Team: Midge Minium, MD as PCP - General (Family Medicine) Nicholas Lose, MD as Consulting Physician (Hematology and Oncology)  SUMMARY OF ONCOLOGIC HISTORY:   Primary cancer of upper outer quadrant of right breast ([place])   09/30/2014 Initial Diagnosis Right breast invasive ductal carcinoma grade 1, Ki-67 5%, ER positive, PR positive, HER-2 negative, 1.4 cm mass at 11:30 position no lymph nodes by ultrasound T1 cN0 M0 stage IA clinical stage   11/02/2014 Surgery Right lumpectomy: Invasive ductal carcinoma with extracellular mucin, grade 2/3, 1.8 cm, 0/2 lymph nodes negative, ER 98%, PR 63%, HER-2 negative ratio 1.58, Ki-67 7%, Oncotype DX score 12, 8% risk of recurrence, T1 cN0 stage IA   12/28/2014 - 01/27/2015 Radiation Therapy Adjuvant radiation therapy with boost   03/01/2015 -  Anti-estrogen oral therapy Anastrozole 1 mg daily    CHIEF COMPLIANT: Follow-up on anastrozole  INTERVAL HISTORY: [name] [name] is a 60 year old with above-mentioned history of right breast cancer treated with lumpectomy radiation is currently on anastrozole. She is tolerating it extremely well. Her hot flashes have improved significantly. Denies any new complaints or concerns. Her recent mammogram was normal.  REVIEW OF SYSTEMS:   Constitutional: Denies fevers, chills or abnormal weight loss Eyes: Denies blurriness of vision Ears, nose, mouth, throat, and face: Denies mucositis or sore throat Respiratory: Denies cough, dyspnea or wheezes Cardiovascular: Denies palpitation, chest discomfort Gastrointestinal:  Denies nausea, heartburn or change in bowel habits Skin: Denies abnormal skin rashes Lymphatics: Denies new lymphadenopathy or easy bruising Neurological:Denies numbness, tingling or new weaknesses Behavioral/Psych: Mood is stable, no new changes  Extremities: No lower extremity edema Breast:  denies any pain or lumps or nodules in either breasts All other systems were reviewed with the  patient and are negative.  I have reviewed the past medical history, past surgical history, social history and family history with the patient and they are unchanged from previous note.  ALLERGIES:  has No Known Allergies.  MEDICATIONS:  Current Outpatient Prescriptions  Medication Sig Dispense Refill  . anastrozole (ARIMIDEX) 1 MG tablet Take 1 tablet (1 mg total) by mouth daily. 90 tablet 3  . Multiple Vitamin (MULTIVITAMIN) tablet Take 1 tablet by mouth daily.    Marland Kitchen UNABLE TO FIND Dispense compression sleeve 20-30 mm/Hg for this pt with history of breast cancer s/p surgery. (Patient not taking: Reported on 11/02/2015) 1 each 0  . Vitamin D, Ergocalciferol, (DRISDOL) 50000 units CAPS capsule Take 1 capsule (50,000 Units total) by mouth every 7 (seven) days. 12 capsule 0  . vitamin E 1000 UNIT capsule Take 1,000 Units by mouth daily.     No current facility-administered medications for this visit.    PHYSICAL EXAMINATION: ECOG PERFORMANCE STATUS: 0 - Asymptomatic  There were no vitals filed for this visit. There were no vitals filed for this visit.  GENERAL:alert, no distress and comfortable SKIN: skin color, texture, turgor are normal, no rashes or significant lesions EYES: normal, Conjunctiva are pink and non-injected, sclera clear OROPHARYNX:no exudate, no erythema and lips, buccal mucosa, and tongue normal  NECK: supple, thyroid normal size, non-tender, without nodularity LYMPH:  no palpable lymphadenopathy in the cervical, axillary or inguinal LUNGS: clear to auscultation and percussion with normal breathing effort HEART: regular rate & rhythm and no murmurs and no lower extremity edema ABDOMEN:abdomen soft, non-tender and normal bowel sounds MUSCULOSKELETAL:no cyanosis of digits and no clubbing  NEURO: alert & oriented x 3 with fluent speech, no focal motor/sensory deficits EXTREMITIES: No lower extremity  edema BREAST: Tenderness in the right breast to palpation. No palpable  nodules or lumps. No palpable axillary supraclavicular or infraclavicular adenopathy no breast tenderness or nipple discharge. (exam performed in the presence of a chaperone)  LABORATORY DATA:  I have reviewed the data as listed   Chemistry      Component Value Date/Time   NA 135 11/02/2015 1036   K 4.7 11/02/2015 1036   CL 100 11/02/2015 1036   CO2 30 11/02/2015 1036   BUN 19 11/02/2015 1036   CREATININE 0.68 11/02/2015 1036      Component Value Date/Time   CALCIUM 9.6 11/02/2015 1036   ALKPHOS 50 11/02/2015 1036   AST 15 11/02/2015 1036   ALT 12 11/02/2015 1036   BILITOT 0.8 11/02/2015 1036       Lab Results  Component Value Date   WBC 5.5 11/02/2015   HGB 13.3 11/02/2015   HCT 39.4 11/02/2015   MCV 93.1 11/02/2015   PLT 210.0 11/02/2015   NEUTROABS 3.0 11/02/2015     ASSESSMENT & PLAN:  Primary cancer of upper outer quadrant of right breast Right lumpectomy 11/02/2014: Invasive ductal carcinoma with extracellular mucin, grade 2/3, 1.8 cm, 0/2 lymph nodes negative, ER 98%, PR 63%, HER-2 negative ratio 1.58, Ki-67 7%, Oncotype DX score 12, 8% risk of recurrence, T1 cN0 stage IA status post radiation completed 01/27/2015  Current treatment: Anastrozole adjuvant therapy 1 mg daily 5 years started October 2016 Anastrozole toxicities:  1. Hot flashes Are now improved to 2-3 times per day 2. Slightly more moody than usual Patient plans to go to [place] in October for her niece's wedding and is very excited about it.  Breast Cancer Surveillance: 1. Breast exam 12/25/2015 tenderness in the right breast: Normal 2. Mammogram May 2017 No abnormalities. Postsurgical changes.    Return to clinic in 6 months for surveillance and follow-up after that we can see her once a year   No orders of the defined types were placed in this encounter.   The patient has a good understanding of the overall plan. she agrees with it. she will call with any problems that may develop before  the next visit here.   Rulon Eisenmenger, MD 12/08/2015

## 2015-12-08 NOTE — Telephone Encounter (Signed)
appt made and avs printed °

## 2015-12-09 ENCOUNTER — Encounter: Payer: Self-pay | Admitting: Gastroenterology

## 2015-12-29 ENCOUNTER — Encounter: Payer: Self-pay | Admitting: Family Medicine

## 2015-12-29 ENCOUNTER — Ambulatory Visit (INDEPENDENT_AMBULATORY_CARE_PROVIDER_SITE_OTHER): Payer: 59 | Admitting: Family Medicine

## 2015-12-29 DIAGNOSIS — M2550 Pain in unspecified joint: Secondary | ICD-10-CM | POA: Insufficient documentation

## 2015-12-29 LAB — CBC WITH DIFFERENTIAL/PLATELET
BASOS ABS: 0 10*3/uL (ref 0.0–0.1)
Basophils Relative: 0.5 % (ref 0.0–3.0)
EOS ABS: 0.1 10*3/uL (ref 0.0–0.7)
Eosinophils Relative: 2.3 % (ref 0.0–5.0)
HCT: 38.4 % (ref 36.0–46.0)
Hemoglobin: 12.9 g/dL (ref 12.0–15.0)
LYMPHS ABS: 1.4 10*3/uL (ref 0.7–4.0)
Lymphocytes Relative: 32.5 % (ref 12.0–46.0)
MCHC: 33.7 g/dL (ref 30.0–36.0)
MCV: 93.7 fl (ref 78.0–100.0)
MONO ABS: 0.5 10*3/uL (ref 0.1–1.0)
Monocytes Relative: 12 % (ref 3.0–12.0)
NEUTROS PCT: 52.7 % (ref 43.0–77.0)
Neutro Abs: 2.2 10*3/uL (ref 1.4–7.7)
Platelets: 224 10*3/uL (ref 150.0–400.0)
RBC: 4.09 Mil/uL (ref 3.87–5.11)
RDW: 13.3 % (ref 11.5–15.5)
WBC: 4.2 10*3/uL (ref 4.0–10.5)

## 2015-12-29 LAB — RHEUMATOID FACTOR: RHEUMATOID FACTOR: 20 [IU]/mL — AB (ref <=14)

## 2015-12-29 LAB — SEDIMENTATION RATE: Sed Rate: 3 mm/hr (ref 0–30)

## 2015-12-29 MED ORDER — MELOXICAM 15 MG PO TABS: 15.0000 mg | ORAL_TABLET | Freq: Every day | ORAL | 1 refills | Status: DC

## 2015-12-29 NOTE — Assessment & Plan Note (Signed)
New.  Pt's sxs are diffuse and concerning for a systemic process rather than an injury.  Check labs to r/o RA or other autoimmune process.  Check Lyme disease given hx of recent tick bite.  Start daily Mobic.  If no improvement and labs are unrevealing, will refer to Rheum.  Reviewed supportive care and red flags that should prompt return.  Pt expressed understanding and is in agreement w/ plan.

## 2015-12-29 NOTE — Patient Instructions (Signed)
Follow up as needed We'll notify you of your lab results and make any changes if needed Start the once daily Mobic- take w/ food Drink plenty of fluids If no improvement in the next 1-2 weeks, let me know and we'll refer to rheumatology for additional workup Call with any questions or concerns Hang in there!!!

## 2015-12-29 NOTE — Progress Notes (Signed)
   Subjective:    Patient ID: [name] [name], female    DOB: June 15, 1955, 60 y.o.   MRN: [id number]  HPI Joint pains- first noticed in fingers bilaterally.  Pt will wake up and 'they're really really super stiff.  It's every knuckle'.  L ankle is also swollen and painful.  sxs started ~2 weeks ago.  sxs started abruptly.  No known injury.  No medication changes.  + fatigue.  Wrists are also swollen, tender, and weaker than usual.  Tick bite ~3 weeks ago.  No rashes.  No family hx of RA or autoimmune diseases.  No CP, SOB.   Review of Systems For ROS see HPI     Objective:   Physical Exam  Constitutional: She appears well-developed and well-nourished. No distress.  HENT:  Head: Normocephalic and atraumatic.  Cardiovascular: Intact distal pulses.   Musculoskeletal: She exhibits edema (swelling of both hands and L ankle) and tenderness (TTP over top of L foot/ankle, TTP over wrists bilaterally).  Neurological: She is alert.  Skin: Skin is warm and dry. No rash noted. No erythema.  Psychiatric: She has a normal mood and affect. Her behavior is normal. Thought content normal.  Vitals reviewed.         Assessment & Plan:

## 2015-12-29 NOTE — Progress Notes (Signed)
Pre visit review using our clinic review tool, if applicable. No additional management support is needed unless otherwise documented below in the visit note. 

## 2015-12-30 LAB — LYME AB/WESTERN BLOT REFLEX

## 2015-12-30 LAB — ANA: Anti Nuclear Antibody(ANA): NEGATIVE

## 2016-01-02 ENCOUNTER — Other Ambulatory Visit: Payer: Self-pay | Admitting: Family Medicine

## 2016-01-02 DIAGNOSIS — R768 Other specified abnormal immunological findings in serum: Secondary | ICD-10-CM

## 2016-01-23 ENCOUNTER — Other Ambulatory Visit: Payer: Self-pay | Admitting: Hematology and Oncology

## 2016-01-23 ENCOUNTER — Other Ambulatory Visit: Payer: Self-pay | Admitting: Family Medicine

## 2016-01-23 DIAGNOSIS — C50411 Malignant neoplasm of upper-outer quadrant of right female breast: Secondary | ICD-10-CM

## 2016-01-23 NOTE — Telephone Encounter (Signed)
Chart reviewed.

## 2016-01-24 ENCOUNTER — Telehealth: Payer: Self-pay | Admitting: Family Medicine

## 2016-01-24 ENCOUNTER — Ambulatory Visit (AMBULATORY_SURGERY_CENTER): Payer: Self-pay

## 2016-01-24 ENCOUNTER — Encounter: Payer: Self-pay | Admitting: Gastroenterology

## 2016-01-24 VITALS — Ht 69.0 in | Wt 180.6 lb

## 2016-01-24 DIAGNOSIS — Z1211 Encounter for screening for malignant neoplasm of colon: Secondary | ICD-10-CM

## 2016-01-24 MED ORDER — NA SULFATE-K SULFATE-MG SULF 17.5-3.13-1.6 GM/177ML PO SOLN
ORAL | 0 refills | Status: DC
Start: 2016-01-24 — End: 2016-02-01

## 2016-01-24 NOTE — Telephone Encounter (Signed)
Patient states she called pharmacy for refill of Vitamin D, Ergocalciferol, (DRISDOL) 50000 units CAPS capsule and was advised she need to contact pcp to request new prescription as hers has expired.  Please send script to [facility].

## 2016-01-24 NOTE — Telephone Encounter (Signed)
Pt informed that per Dr. Birdie Riddle that medication has no refills. Pt is to continue OTC vitamin D 2000iu daily.

## 2016-01-24 NOTE — Progress Notes (Signed)
Per pt, no allergies to soy or egg products.Pt not taking any weight loss meds or using  O2 at home. 

## 2016-02-01 ENCOUNTER — Ambulatory Visit (AMBULATORY_SURGERY_CENTER): Payer: 59 | Admitting: Gastroenterology

## 2016-02-01 ENCOUNTER — Encounter: Payer: Self-pay | Admitting: Gastroenterology

## 2016-02-01 VITALS — BP 134/65 | HR 63 | Temp 98.6°F | Resp 13 | Ht 69.0 in | Wt 180.0 lb

## 2016-02-01 DIAGNOSIS — Z1211 Encounter for screening for malignant neoplasm of colon: Secondary | ICD-10-CM

## 2016-02-01 DIAGNOSIS — D120 Benign neoplasm of cecum: Secondary | ICD-10-CM | POA: Diagnosis not present

## 2016-02-01 MED ORDER — SODIUM CHLORIDE 0.9 % IV SOLN: 500.0000 mL | INTRAVENOUS | Status: DC

## 2016-02-01 NOTE — Op Note (Signed)
[facility] Patient Name: [name] [name] Procedure Date: 02/01/2016 10:51 AM MRN: [id number] Endoscopist: Catharine. Loletha Carrow , MD Age: 60 Referring MD:  Date of Birth: 04-17-1956 Gender: Female Account #: [id number] Procedure:                Colonoscopy Indications:              Screening for colorectal malignant neoplasm Medicines:                Monitored Anesthesia Care Procedure:                Pre-Anesthesia Assessment:                           - Prior to the procedure, a History and Physical                            was performed, and patient medications and                            allergies were reviewed. The patient's tolerance of                            previous anesthesia was also reviewed. The risks                            and benefits of the procedure and the sedation                            options and risks were discussed with the patient.                            All questions were answered, and informed consent                            was obtained. Prior Anticoagulants: The patient has                            taken no previous anticoagulant or antiplatelet                            agents. ASA Grade Assessment: II - A patient with                            mild systemic disease. After reviewing the risks                            and benefits, the patient was deemed in                            satisfactory condition to undergo the procedure.                           After obtaining informed consent, the colonoscope  was passed under direct vision. Throughout the                            procedure, the patient's blood pressure, pulse, and                            oxygen saturations were monitored continuously. The                            Model CF-HQ190L [id number]) scope was introduced                            through the anus and advanced to the the cecum,                            identified by  appendiceal orifice and ileocecal                            valve. The colonoscopy was performed without                            difficulty. The patient tolerated the procedure                            well. The quality of the bowel preparation was                            good. The ileocecal valve, appendiceal orifice, and                            rectum were photographed. The quality of the bowel                            preparation was evaluated using the BBPS [facility]                            Bowel Preparation Scale) with scores of: Right                            Colon = 2, Transverse Colon = 2 and Left Colon = 2.                            The total BBPS score equals 6. The bowel                            preparation used was SUPREP. Scope In: 11:06:02 AM Scope Out: 11:19:49 AM Scope Withdrawal Time: 0 hours 8 minutes 56 seconds  Total Procedure Duration: 0 hours 13 minutes 47 seconds  Findings:                 The perianal and digital rectal examinations were                            normal.  A 2 mm polyp was found in the cecum. The polyp was                            sessile. The polyp was removed with a cold biopsy                            forceps. Resection and retrieval were complete.                           Internal hemorrhoids were found during                            retroflexion. The hemorrhoids were Grade I                            (internal hemorrhoids that do not prolapse).                           The exam was otherwise without abnormality. Complications:            No immediate complications. Estimated Blood Loss:     Estimated blood loss: none. Impression:               - One 2 mm polyp in the cecum, removed with a cold                            biopsy forceps. Resected and retrieved.                           - Internal hemorrhoids.                           - The examination was otherwise  normal. Recommendation:           - Patient has a contact number available for                            emergencies. The signs and symptoms of potential                            delayed complications were discussed with the                            patient. Return to normal activities tomorrow.                            Written discharge instructions were provided to the                            patient.                           - Resume previous diet.                           - Continue present medications.                           -  Await pathology results.                           - Repeat colonoscopy is recommended for                            surveillance. The colonoscopy date will be                            determined after pathology results from today's                            exam become available for review. Henry L. Loletha Carrow, MD 02/01/2016 11:23:13 AM This report has been signed electronically.

## 2016-02-01 NOTE — Patient Instructions (Signed)
YOU HAD AN ENDOSCOPIC PROCEDURE TODAY AT THE  ENDOSCOPY CENTER:   Refer to the procedure report that was given to you for any specific questions about what was found during the examination.  If the procedure report does not answer your questions, please call your gastroenterologist to clarify.  If you requested that your care partner not be given the details of your procedure findings, then the procedure report has been included in a sealed envelope for you to review at your convenience later.  YOU SHOULD EXPECT: Some feelings of bloating in the abdomen. Passage of more gas than usual.  Walking can help get rid of the air that was put into your GI tract during the procedure and reduce the bloating. If you had a lower endoscopy (such as a colonoscopy or flexible sigmoidoscopy) you may notice spotting of blood in your stool or on the toilet paper. If you underwent a bowel prep for your procedure, you may not have a normal bowel movement for a few days.  Please Note:  You might notice some irritation and congestion in your nose or some drainage.  This is from the oxygen used during your procedure.  There is no need for concern and it should clear up in a day or so.  SYMPTOMS TO REPORT IMMEDIATELY:   Following lower endoscopy (colonoscopy or flexible sigmoidoscopy):  Excessive amounts of blood in the stool  Significant tenderness or worsening of abdominal pains  Swelling of the abdomen that is new, acute  Fever of 100F or higher    For urgent or emergent issues, a gastroenterologist can be reached at any hour by calling [phone].   DIET:  We do recommend a small meal at first, but then you may proceed to your regular diet.  Drink plenty of fluids but you should avoid alcoholic beverages for 24 hours.  ACTIVITY:  You should plan to take it easy for the rest of today and you should NOT DRIVE or use heavy machinery until tomorrow (because of the sedation medicines used during the test).     FOLLOW UP: Our staff will call the number listed on your records the next business day following your procedure to check on you and address any questions or concerns that you may have regarding the information given to you following your procedure. If we do not reach you, we will leave a message.  However, if you are feeling well and you are not experiencing any problems, there is no need to return our call.  We will assume that you have returned to your regular daily activities without incident.  If any biopsies were taken you will be contacted by phone or by letter within the next 1-3 weeks.  Please call us at [phone] if you have not heard about the biopsies in 3 weeks.    SIGNATURES/CONFIDENTIALITY: You and/or your care partner have signed paperwork which will be entered into your electronic medical record.  These signatures attest to the fact that that the information above on your After Visit Summary has been reviewed and is understood.  Full responsibility of the confidentiality of this discharge information lies with you and/or your care-partner.   Resume medications. Information given on polyps and hemorrhoids. 

## 2016-02-01 NOTE — Progress Notes (Signed)
Called to room to assist during endoscopic procedure.  Patient ID and intended procedure confirmed with present staff. Received instructions for my participation in the procedure from the performing physician.  

## 2016-02-01 NOTE — Progress Notes (Signed)
  [facility] Post-op Note  Patient: [name] [name]  Procedure(s) Performed: colonoscopy  Patient Location: [facility] - Recovery Area  Anesthesia Type: Deep Sedation/Propofol  Level of Consciousness: awake, oriented and patient cooperative  Airway and Oxygen Therapy: Patient Spontanous Breathing  Post-op Pain: none  Post-op Assessment:  Post-op Vital signs reviewed, Patient's Cardiovascular Status Stable, Respiratory Function Stable, Patent Airway, No signs of Nausea or vomiting and Pain level controlled  Post-op Vital Signs: Reviewed and stable  Complications: No apparent anesthesia complications  Celenia Hruska E 11:26 AM

## 2016-02-02 ENCOUNTER — Telehealth: Payer: Self-pay

## 2016-02-02 NOTE — Telephone Encounter (Signed)
Left message on answering machine. 

## 2016-02-07 ENCOUNTER — Encounter: Payer: Self-pay | Admitting: Gastroenterology

## 2016-03-23 ENCOUNTER — Telehealth: Payer: Self-pay | Admitting: Family Medicine

## 2016-03-23 DIAGNOSIS — G4730 Sleep apnea, unspecified: Secondary | ICD-10-CM

## 2016-03-23 DIAGNOSIS — R0683 Snoring: Secondary | ICD-10-CM

## 2016-03-23 NOTE — Telephone Encounter (Signed)
I need a reason for a sleep study as this was not discussed at our visit

## 2016-03-23 NOTE — Telephone Encounter (Signed)
Pt would like a referral to [facility] to have a sleep study done.

## 2016-03-23 NOTE — Telephone Encounter (Signed)
Please advise pt last appt was 12/29/15 polyarthralgia  Ok for referral?

## 2016-03-26 ENCOUNTER — Ambulatory Visit (INDEPENDENT_AMBULATORY_CARE_PROVIDER_SITE_OTHER): Payer: 59

## 2016-03-26 ENCOUNTER — Ambulatory Visit (INDEPENDENT_AMBULATORY_CARE_PROVIDER_SITE_OTHER): Payer: 59 | Admitting: Orthopaedic Surgery

## 2016-03-26 DIAGNOSIS — M25562 Pain in left knee: Secondary | ICD-10-CM

## 2016-03-26 DIAGNOSIS — M7632 Iliotibial band syndrome, left leg: Secondary | ICD-10-CM | POA: Diagnosis not present

## 2016-03-26 MED ORDER — METHYLPREDNISOLONE 4 MG PO TBPK: ORAL_TABLET | ORAL | 0 refills | Status: DC

## 2016-03-26 MED ORDER — MELOXICAM 15 MG PO TABS: 15.0000 mg | ORAL_TABLET | Freq: Every day | ORAL | 1 refills | Status: DC

## 2016-03-26 NOTE — Telephone Encounter (Signed)
Patient states she is having excessive snoring, daytime sleepiness, fatigue, chocking when sleeping which wakes her up, her husband has witness her stop breathing when he is awake.

## 2016-03-26 NOTE — Telephone Encounter (Signed)
[facility] for referral to pulm for snoring and sleep apnea

## 2016-03-26 NOTE — Telephone Encounter (Signed)
Patient states she is having excessive snoring, daytime sleepiness, fatigue, chocking when sleeping which wakes her up, her husband has witness her stop breathing

## 2016-03-26 NOTE — Telephone Encounter (Signed)
Could you call patient and verify the need for sleep study. Is she having increased snoring, has it been documented she stops breathing or is this onging (diagnosed in the past)

## 2016-03-26 NOTE — Telephone Encounter (Signed)
This referral was placed today.  

## 2016-03-26 NOTE — Progress Notes (Signed)
Office Visit Note   Patient: [name] [name]           Date of Birth: [birth year]           MRN: [id number] Visit Date: 03/26/2016              Requested by: Midge Minium, MD [address] [place], [place] [address] PCP: Annye Asa, MD   Assessment & Plan: Visit Diagnoses:  1. Acute pain of left knee   2. It band syndrome, left     Plan: We are going to try combination of meloxicam and a Medrol Dosepak. I do feel this is IT band syndrome and we'll also do well with physical therapy. She is from [place] so we will send her to [facility] physical therapy and [place]. See her back in a month to see how she responded to this.  Follow-Up Instructions: Return in about 4 weeks (around 04/23/2016).   Orders:  Orders Placed This Encounter  Procedures  . XR Knee 1-2 Views Left   Meds ordered this encounter  Medications  . methylPREDNISolone (MEDROL DOSEPAK) 4 MG TBPK tablet    Sig: Steroid taper orally over 6 days    Dispense:  21 tablet    Refill:  0  . meloxicam (MOBIC) 15 MG tablet    Sig: Take 1 tablet (15 mg total) by mouth daily.    Dispense:  30 tablet    Refill:  1      Procedures: No procedures performed   Clinical Data: No additional findings.   Subjective: Chief Complaint  Patient presents with  . Right Knee - Pain    x8 weeks, having a lot of pain in left knee. Does water aerobics but had no actual injury. Activity hurts knee worse. A lot of walking or standing is painful. Denies much swelling    HPI  Review of Systems   Objective: Vital Signs: There were no vitals taken for this visit.  Physical Exam  Ortho Exam Her left knee shows excellent range of motion, it has a normal ligamentous exam, there is no effusion or obvious gross bony deformity. She does hurt over her IT band to the lateral aspect and somewhat over the lateral gastroc Specialty Comments:  No specialty comments available.  Imaging: Xr Knee 1-2 Views  Left  Result Date: 03/26/2016 2 views of her left knee show well-maintained joint spaces good alignment overall in no obvious acute bone    PMFS History: Patient Active Problem List   Diagnosis Date Noted  . Polyarthralgia 12/29/2015  . Physical exam 11/02/2015  . Primary cancer of upper outer quadrant of right breast ([place]) 10/22/2014  . Palpitations 12/25/2012  . History: SVT (supraventricular tachycardia), status post RFA ablation 12/25/2012   Past Medical History:  Diagnosis Date  . Cancer [facility]) 2016   right breast cancer  . Cancer of right breast [facility])    last radiation was 01/2015  . Dysrhythmia    no problems since ablation 2004  . Lipoma of abdominal wall   . Paroxysmal SVT (supraventricular tachycardia) ([place]) 2004   Status post RFA ABLATION  . S/P radiation therapy 12/28/2014 through 01/27/2015   Right breast 4250 cGy 17 sessions, right breast boost 1200 cGy in 6 sessions     Family History  Problem Relation Age of Onset  . CVA Mother   . Bladder Cancer Father   . CVA Maternal Grandmother     x2  . Heart attack Maternal  Grandfather   . Leukemia Paternal Grandfather   . Acute lymphoblastic leukemia Paternal Grandfather   . Supraventricular tachycardia Sister   . Supraventricular tachycardia Brother   . Supraventricular tachycardia Son     passed at 74  . Supraventricular tachycardia Grandchild     4 grandchildren  . Ovarian cancer Paternal Aunt   . Lung cancer Paternal Uncle   . Lung cancer Paternal Uncle   . Lung cancer Paternal Aunt     Past Surgical History:  Procedure Laterality Date  . ABDOMINAL HYSTERECTOMY  1999   WITH BLADDER REPAIR FOR PROPLAPSE-HAS OVARIES (NO CA)  . BREAST LUMPECTOMY WITH RADIOACTIVE SEED AND SENTINEL LYMPH NODE BIOPSY Right 11/02/2014   Procedure: RIGHT BREAST LUMPECTOMY WITH RADIOACTIVE SEED AND RIGHT AXILLARY SENTINEL LYMPH NODE BIOPSY;  Surgeon: Excell Seltzer, MD;  Location: [facility];  Service: General;  Laterality: Right;  . COLONOSCOPY    . KNEE CARTILAGE SURGERY    . LIPOMA EXCISION N/A 03/23/2015   Procedure: EXCISION LIPOMA ABDOMINAL [facility];  Surgeon: Excell Seltzer, MD;  Location: [facility];  Service: General;  Laterality: N/A;  . STAPEDECTOMY Right   . SVT ablation  2000   Social History   Occupational History  . Not on file.   Social History Main Topics  . Smoking status: Former Smoker    Quit date: 12/16/1977  . Smokeless tobacco: Never Used  . Alcohol use 2.4 - 3.0 oz/week    4 - 5 Glasses of wine per week     Comment: red wine daily  . Drug use: No  . Sexual activity: Yes    Birth control/ protection: Surgical

## 2016-03-27 ENCOUNTER — Ambulatory Visit (INDEPENDENT_AMBULATORY_CARE_PROVIDER_SITE_OTHER): Payer: 59 | Admitting: Pulmonary Disease

## 2016-03-27 ENCOUNTER — Encounter: Payer: Self-pay | Admitting: Pulmonary Disease

## 2016-03-27 VITALS — BP 108/72 | HR 74 | Ht 69.0 in | Wt 176.8 lb

## 2016-03-27 DIAGNOSIS — R0683 Snoring: Secondary | ICD-10-CM

## 2016-03-27 NOTE — Progress Notes (Signed)
   Subjective:    Patient ID: [name] [name], female    DOB: 02/19/1956, 60 y.o.   MRN: [id number]  HPI    Review of Systems  Constitutional: Negative for fever and unexpected weight change.  HENT: Negative for congestion, dental problem, ear pain, nosebleeds, postnasal drip, rhinorrhea, sinus pressure, sneezing, sore throat and trouble swallowing.   Eyes: Negative for redness and itching.  Respiratory: Negative for cough, chest tightness, shortness of breath and wheezing.   Cardiovascular: Negative for palpitations and leg swelling.  Gastrointestinal: Negative for nausea and vomiting.  Genitourinary: Negative for dysuria.  Musculoskeletal: Negative for joint swelling.  Skin: Negative for rash.  Neurological: Negative for headaches.  Hematological: Does not bruise/bleed easily.  Psychiatric/Behavioral: Negative for dysphoric mood. The patient is not nervous/anxious.        Objective:   Physical Exam        Assessment & Plan:

## 2016-03-27 NOTE — Progress Notes (Signed)
Past Surgical History She  has a past surgical history that includes Stapedectomy (Right); Abdominal hysterectomy (1999); SVT ablation (2000); Knee cartilage surgery; Breast lumpectomy with radioactive seed and sentinel lymph node biopsy (Right, 11/02/2014); Lipoma excision (N/A, 03/23/2015); and Colonoscopy.  No Known Allergies  Family History Her family history includes Acute lymphoblastic leukemia in her paternal grandfather; Bladder Cancer in her father; CVA in her maternal grandmother and mother; Heart attack in her maternal grandfather; Leukemia in her paternal grandfather; Lung cancer in her paternal aunt, paternal uncle, and paternal uncle; Ovarian cancer in her paternal aunt; Supraventricular tachycardia in her brother, grandchild, sister, and son.  Social History She  reports that she quit smoking about 38 years ago. She has a 12.00 pack-year smoking history. She has never used smokeless tobacco. She reports that she drinks about 2.4 - 3.0 oz of alcohol per week . She reports that she does not use drugs.  Review of systems Weight change, anxiety, depression, joint pain.  Otherwise negative.  Current Outpatient Prescriptions on File Prior to Visit  Medication Sig  . anastrozole (ARIMIDEX) 1 MG tablet TAKE ONE TABLET BY MOUTH ONCE DAILY  . meloxicam (MOBIC) 15 MG tablet Take 1 tablet (15 mg total) by mouth daily.  . Multiple Vitamin (MULTIVITAMIN) tablet Take 1 tablet by mouth daily.  Marland Kitchen UNABLE TO FIND Dispense compression sleeve 20-30 mm/Hg for this pt with history of breast cancer s/p surgery.  . vitamin E 1000 UNIT capsule Take 1,000 Units by mouth daily.  . methylPREDNISolone (MEDROL DOSEPAK) 4 MG TBPK tablet Steroid taper orally over 6 days (Patient not taking: Reported on 03/27/2016)   Current Facility-Administered Medications on File Prior to Visit  Medication  . 0.9 %  sodium chloride infusion    Chief Complaint  Patient presents with  . Sleep Consult    referred y Dr  Birdie Riddle for snoring - c/o excessive sleepiness, waking up exhausted and wakes up gasping.  spouse states she stops breathing in her sleep    Past medical history She  has a past medical history of Cancer ([place]) (2016); Cancer of right breast ([place]); Dysrhythmia; Lipoma of abdominal wall; Paroxysmal SVT (supraventricular tachycardia) ([place]) (2004); and S/P radiation therapy (12/28/2014 through 01/27/2015).  Vital signs BP 108/72 (BP Location: Left Arm, Cuff Size: Normal)   Pulse 74   Ht 5\' 9"  (1.753 m)   Wt 176 lb 12.8 oz (80.2 kg)   SpO2 97%   BMI 26.11 kg/m   History of Present Illness [name] [name] is a 60 y.o. female for evaluation of sleep problems.  Her husband has been concerned about her snoring.  This has been getting worse.  She will stop breathing while asleep.  She is a restless sleeper.  Her mouth gets very dry at night.  She does not dream much.  She does grind her teeth >> had mouth guard, but had trouble keeping in her mouth.  She goes to sleep at 10 pm.  She falls asleep after 30 minutes.  She wakes up some times to use the bathroom.  She gets out of bed at 8 am.  She feels tired in the morning.  She denies morning headache.  She does not use anything to help her fall sleep or stay awake.  She denies sleep walking, sleep talking, or nightmares.  There is no history of restless legs.  She denies sleep hallucinations, sleep paralysis, or cataplexy.  The Epworth score is 7 out of 24.   Physical Exam:  General - No distress ENT - No sinus tenderness, no oral exudate, no LAN, no thyromegaly, TM clear, pupils equal/reactive, MP 4, enlarged tongue, 2+ tonsils Cardiac - s1s2 regular, no murmur, pulses symmetric Chest - No wheeze/rales/dullness, good air entry, normal respiratory excursion Back - No focal tenderness Abd - Soft, non-tender, no organomegaly, + bowel sounds Ext - No edema Neuro - Normal strength, cranial nerves  intact Skin - No rashes Psych - Normal mood, and behavior  Discussion: She has snoring, sleep disruption, daytime sleepiness and witnessed apnea.  I am concerned she could have sleep apnea.  We discussed how sleep apnea can affect various health problems, including risks for hypertension, cardiovascular disease, and diabetes.  We also discussed how sleep disruption can increase risks for accidents, such as while driving.  Weight loss as a means of improving sleep apnea was also reviewed.  Additional treatment options discussed were CPAP therapy, oral appliance, and surgical intervention.  Assessment/plan:  Snoring with concern for obstructive sleep apnea.  - will arrange for home sleep study    Patient Instructions  Will arrange for home sleep study Will call to arrange for follow up after sleep study reviewed     Chesley Mires, M.D. Pager [phone] 03/27/2016, 4:28 PM

## 2016-03-27 NOTE — Patient Instructions (Signed)
Will arrange for home sleep study Will call to arrange for follow up after sleep study reviewed  

## 2016-04-09 ENCOUNTER — Telehealth (INDEPENDENT_AMBULATORY_CARE_PROVIDER_SITE_OTHER): Payer: Self-pay | Admitting: *Deleted

## 2016-04-09 NOTE — Telephone Encounter (Signed)
Pt. Called stating the pain patch dexamethasone was not at the [facility] along with other medication. Pt wondering if it was sent to another pharmacy? Pt call back is [phone].

## 2016-04-10 NOTE — Telephone Encounter (Signed)
Rx's called into her pharmacy

## 2016-04-11 ENCOUNTER — Telehealth: Payer: Self-pay | Admitting: Hematology and Oncology

## 2016-04-11 NOTE — Telephone Encounter (Signed)
Patient called back and stated that when she went to Physical Therapy, the therapist said that Dr. Ninfa Linden had circled for her to have the dexamethasone patch. She has been told that she can get this at [facility].  She needs to know if Dr. Ninfa Linden does want her to have this, and if so, if you can call it in. Please call patient back to advise.  [phone]

## 2016-04-11 NOTE — Telephone Encounter (Signed)
See below, did you mean to circle that?

## 2016-04-11 NOTE — Telephone Encounter (Signed)
I did not mean to circle the patch

## 2016-04-11 NOTE — Telephone Encounter (Signed)
Appointment rescheduled per patient request. Patient requested to have appointment rescheduled to end of February or beginning or March.

## 2016-04-12 NOTE — Telephone Encounter (Signed)
Patient aware this was sent in error

## 2016-04-13 ENCOUNTER — Telehealth (INDEPENDENT_AMBULATORY_CARE_PROVIDER_SITE_OTHER): Payer: Self-pay | Admitting: Orthopaedic Surgery

## 2016-04-13 NOTE — Telephone Encounter (Signed)
Ms. Keltner called saying she has an upcoming appointment with Dr. Ninfa Linden on December 5th. She's been going to Physical Therapy but said it's not working for her and the pain hasn't improved. Her left knee is still causing her trouble and it's difficult for her to be mobile. She's wondering if Dr. Ninfa Linden will send an order for her to have a MRI before her next appointment so he can have the results by the time she comes on December 5th. Please give her a phone call regarding this.  Pt's ph# [phone] Thank you.

## 2016-04-13 NOTE — Telephone Encounter (Signed)
See below

## 2016-04-16 ENCOUNTER — Other Ambulatory Visit (INDEPENDENT_AMBULATORY_CARE_PROVIDER_SITE_OTHER): Payer: Self-pay

## 2016-04-16 DIAGNOSIS — G4733 Obstructive sleep apnea (adult) (pediatric): Secondary | ICD-10-CM

## 2016-04-16 DIAGNOSIS — M25562 Pain in left knee: Secondary | ICD-10-CM

## 2016-04-16 NOTE — Telephone Encounter (Signed)
LMOM for patient that we will send her for MRI

## 2016-04-16 NOTE — Telephone Encounter (Signed)
Ok to order a MRI of her left knee to rule out a meniscal tear. Thanks

## 2016-04-16 NOTE — Telephone Encounter (Signed)
See below message please

## 2016-04-17 ENCOUNTER — Telehealth: Payer: Self-pay | Admitting: Pulmonary Disease

## 2016-04-17 ENCOUNTER — Encounter: Payer: Self-pay | Admitting: Pulmonary Disease

## 2016-04-17 DIAGNOSIS — G4733 Obstructive sleep apnea (adult) (pediatric): Secondary | ICD-10-CM

## 2016-04-17 HISTORY — DX: Obstructive sleep apnea (adult) (pediatric): G47.33

## 2016-04-17 NOTE — Telephone Encounter (Signed)
HST 04/16/16 >> AHI 14.2, SaO2 low 77%  Will have my nurse inform pt that sleep study shows mild to moderate sleep apnea.  Options are 1) CPAP now, 2) ROV first.  If She is agreeable to CPAP, then please send order for auto CPAP range 5 to 15 cm H2O with heated humidity and mask of choice.  Have download sent 1 month after starting CPAP and set up ROV 2 months after starting CPAP.  ROV can by with me or NP.

## 2016-04-18 ENCOUNTER — Other Ambulatory Visit: Payer: Self-pay | Admitting: *Deleted

## 2016-04-18 DIAGNOSIS — R0683 Snoring: Secondary | ICD-10-CM

## 2016-04-18 DIAGNOSIS — G4733 Obstructive sleep apnea (adult) (pediatric): Secondary | ICD-10-CM

## 2016-04-26 ENCOUNTER — Other Ambulatory Visit (INDEPENDENT_AMBULATORY_CARE_PROVIDER_SITE_OTHER): Payer: Self-pay | Admitting: Orthopaedic Surgery

## 2016-04-26 NOTE — Telephone Encounter (Signed)
LM x 1 

## 2016-04-27 ENCOUNTER — Inpatient Hospital Stay: Admission: RE | Admit: 2016-04-27 | Payer: 59 | Source: Ambulatory Visit

## 2016-04-27 NOTE — Telephone Encounter (Signed)
Pt. Was informed of VS message, no order for CPAP was placed since she preferred to return to the office and speak with him about it first. Her appointment has been made. Nothing further is needed at this time.

## 2016-05-01 ENCOUNTER — Ambulatory Visit (INDEPENDENT_AMBULATORY_CARE_PROVIDER_SITE_OTHER): Payer: 59 | Admitting: Orthopaedic Surgery

## 2016-05-02 ENCOUNTER — Ambulatory Visit (INDEPENDENT_AMBULATORY_CARE_PROVIDER_SITE_OTHER): Payer: 59 | Admitting: Pulmonary Disease

## 2016-05-02 ENCOUNTER — Encounter: Payer: Self-pay | Admitting: Pulmonary Disease

## 2016-05-02 ENCOUNTER — Telehealth (INDEPENDENT_AMBULATORY_CARE_PROVIDER_SITE_OTHER): Payer: Self-pay | Admitting: Orthopaedic Surgery

## 2016-05-02 VITALS — BP 112/70 | HR 72 | Ht 69.0 in | Wt 181.0 lb

## 2016-05-02 DIAGNOSIS — G4733 Obstructive sleep apnea (adult) (pediatric): Secondary | ICD-10-CM | POA: Diagnosis not present

## 2016-05-02 NOTE — Patient Instructions (Signed)
Will arrange for CPAP set up  Follow up in 3 months 

## 2016-05-02 NOTE — Progress Notes (Signed)
Current Outpatient Prescriptions on File Prior to Visit  Medication Sig  . anastrozole (ARIMIDEX) 1 MG tablet TAKE ONE TABLET BY MOUTH ONCE DAILY  . Cholecalciferol (VITAMIN D3) 2000 units TABS Take 2,000 Units by mouth daily.  . meloxicam (MOBIC) 15 MG tablet Take 1 tablet (15 mg total) by mouth daily.  . methylPREDNISolone (MEDROL DOSEPAK) 4 MG TBPK tablet Steroid taper orally over 6 days  . Multiple Vitamin (MULTIVITAMIN) tablet Take 1 tablet by mouth daily.  Marland Kitchen UNABLE TO FIND Dispense compression sleeve 20-30 mm/Hg for this pt with history of breast cancer s/p surgery.  . vitamin E 1000 UNIT capsule Take 1,000 Units by mouth daily.   Current Facility-Administered Medications on File Prior to Visit  Medication  . 0.9 %  sodium chloride infusion     Chief Complaint  Patient presents with  . Follow-up    Discuss CPAP treatment and results     Sleep tests HST 04/16/16 >> AHI 14.2, SaO2 low 77%  Past medical history Breast cancer 2016, SVT  Past surgical history, Family history, Social history, Allergies all reviewed.  Vital Signs BP 112/70 (BP Location: Left Arm, Cuff Size: Normal)   Pulse 72   Ht 5\' 9"  (1.753 m)   Wt 181 lb (82.1 kg)   SpO2 97%   BMI 26.73 kg/m   History of Present Illness [name] [name] is a 60 y.o. female with obstructive sleep apnea.  She is here with her husband to review her sleep study.  This showed mild to moderate sleep apnea.  Physical Exam  General - No distress ENT - No sinus tenderness, no oral exudate, no LAN, MP 4, enlarged tongue, 2+ tonsils Cardiac - s1s2 regular, no murmur Chest - No wheeze/rales/dullness Back - No focal tenderness Abd - Soft, non-tender Ext - No edema Neuro - Normal strength Skin - No rashes Psych - normal mood, and behavior   Assessment/Plan  Obstructive sleep apnea. - We discussed how sleep apnea can affect various health problems, including risks for hypertension, cardiovascular disease, and  diabetes.  We also discussed how sleep disruption can increase risks for accidents, such as while driving.  Weight loss as a means of improving sleep apnea was also reviewed.  Additional treatment options discussed were CPAP therapy, oral appliance, and surgical intervention. - she would like to try auto CPAP set up first   Patient Instructions  Will arrange for CPAP set up  Follow up in 3 months   Chesley Mires, MD [place] [facility] Pager:  [phone] 05/02/2016, 2:49 PM

## 2016-05-03 ENCOUNTER — Encounter: Payer: Self-pay | Admitting: Pulmonary Disease

## 2016-05-07 ENCOUNTER — Other Ambulatory Visit: Payer: Self-pay

## 2016-05-07 DIAGNOSIS — C50411 Malignant neoplasm of upper-outer quadrant of right female breast: Secondary | ICD-10-CM

## 2016-05-07 MED ORDER — ANASTROZOLE 1 MG PO TABS: 1.0000 mg | ORAL_TABLET | Freq: Every day | ORAL | 3 refills | Status: DC

## 2016-05-07 NOTE — Telephone Encounter (Signed)
Pt called in to get refill on anastrozole. Told pt will refill today with [facility] at [address]. Pt appreciative and has no further request or concerns at this time.

## 2016-05-07 NOTE — Telephone Encounter (Signed)
Sent message to Dr. Kathrynn Speed for peer to peer

## 2016-05-08 ENCOUNTER — Telehealth (INDEPENDENT_AMBULATORY_CARE_PROVIDER_SITE_OTHER): Payer: Self-pay

## 2016-05-08 NOTE — Telephone Encounter (Signed)
This pt has been waiting on a MRI since Nov.  I called Sabrina, procedure scheduler at [facility] today after getting a e-mail from Barnie Del about this patient.  Gabriel Cirri said she was waiting on a peer to peer and Dr Ninfa Linden has not been able to contact the insurance company yet. I also called Caryl Pina, Dr. Pattricia Boss assistant and ask her to put a sticky note on her computer and for Dr Ninfa Linden.  The patient is trying to get this MRI and Surgery by 05/27/16.  Please process this asap and I will ask Sherrie go ahead and try to look for OR time by the end of the year.

## 2016-05-09 ENCOUNTER — Other Ambulatory Visit: Payer: 59

## 2016-05-09 ENCOUNTER — Ambulatory Visit (INDEPENDENT_AMBULATORY_CARE_PROVIDER_SITE_OTHER): Payer: 59 | Admitting: Orthopaedic Surgery

## 2016-05-09 DIAGNOSIS — G8929 Other chronic pain: Secondary | ICD-10-CM | POA: Diagnosis not present

## 2016-05-09 DIAGNOSIS — M25562 Pain in left knee: Secondary | ICD-10-CM

## 2016-05-09 MED ORDER — METHYLPREDNISOLONE ACETATE 40 MG/ML IJ SUSP
40.0000 mg | INTRAMUSCULAR | Status: AC | PRN
  Administered 2016-05-09: 40 mg via INTRA_ARTICULAR

## 2016-05-09 MED ORDER — LIDOCAINE HCL 1 % IJ SOLN
3.0000 mL | INTRAMUSCULAR | Status: AC | PRN
  Administered 2016-05-09: 3 mL

## 2016-05-09 NOTE — Progress Notes (Signed)
Office Visit Note   Patient: [name] [name]           Date of Birth: 01/23/1956           MRN: [id number] Visit Date: 05/09/2016              Requested by: Midge Minium, MD [address] [place], [place] [address] PCP: Annye Asa, MD   Assessment & Plan: Visit Diagnoses: No diagnosis found.  Plan: Is point hopefully the steroid injection combined with topical anti-inflammatories will help. This is been going on now since September and the patient is getting frustrated. She has tried and failed all forms of conservative treatment except for injections. This includes activity modification, rest, ice, heat, several sessions of physical therapy, and oral anti-inflammatories. I will see her back in a week to see where exam is like having had an injection. If she fails the injection are next up would deathly be ordering an MRI to evaluate the internal structures of her left knee.  Follow-Up Instructions: Return in about 1 week (around 05/16/2016).   Orders:  No orders of the defined types were placed in this encounter.  No orders of the defined types were placed in this encounter.     Procedures: Large Joint Inj Date/Time: 05/09/2016 10:37 AM Performed by: Mcarthur Rossetti Authorized by: Mcarthur Rossetti   Location:  Knee Site:  L knee Ultrasound Guidance: No   Fluoroscopic Guidance: No   Arthrogram: No   Medications:  3 mL lidocaine 1 %; 40 mg methylPREDNISolone acetate 40 MG/ML     Clinical Data: No additional findings.   Subjective: No chief complaint on file. The patient is a pleasant 60 year old female that seen before. She's been having left knee pain since September of this year with no known injury. I first of all this was likely IT band syndrome. We sent her to physical therapy and she went to 3 sessions. She still has the same pain. She points the lateral joint line but also over the IT band as a source for pain. At this point she has  tried and failed all forms conservative treatment. She has not had an injection.  HPI  Review of Systems He denies any chest pain, fever, chills, nausea, vomiting, shortness of breath. She denies any locking and catching or effusion of the left knee.   Objective: Vital Signs: There were no vitals taken for this visit.  Physical Exam She is alert and oriented 3  Ortho Exam Examination of her left knee shows no knee joint effusion. Her Lockman's and McMurray's are negative. She does have pain over the lateral joint line of the knee. She also has pain significant only over the IT band and the fibular head area of the knee.  Specialty Comments:  No specialty comments available.  Imaging: No results found.   PMFS History: Patient Active Problem List   Diagnosis Date Noted  . OSA (obstructive sleep apnea) 04/17/2016  . Polyarthralgia 12/29/2015  . Physical exam 11/02/2015  . Primary cancer of upper outer quadrant of right breast ([place]) 10/22/2014  . Palpitations 12/25/2012  . History: SVT (supraventricular tachycardia), status post RFA ablation 12/25/2012   Past Medical History:  Diagnosis Date  . Cancer [facility]) 2016   right breast cancer  . Cancer of right breast [facility])    last radiation was 01/2015  . Dysrhythmia    no problems since ablation 2004  . Lipoma of abdominal wall   .  OSA (obstructive sleep apnea) 04/17/2016  . Paroxysmal SVT (supraventricular tachycardia) ([place]) 2004   Status post RFA ABLATION  . S/P radiation therapy 12/28/2014 through 01/27/2015   Right breast 4250 cGy 17 sessions, right breast boost 1200 cGy in 6 sessions     Family History  Problem Relation Age of Onset  . CVA Mother   . Bladder Cancer Father   . CVA Maternal Grandmother     x2  . Heart attack Maternal Grandfather   . Leukemia Paternal Grandfather   . Acute lymphoblastic leukemia Paternal Grandfather   .  Supraventricular tachycardia Sister   . Supraventricular tachycardia Brother   . Supraventricular tachycardia Son     passed at 35  . Supraventricular tachycardia Grandchild     4 grandchildren  . Ovarian cancer Paternal Aunt   . Lung cancer Paternal Uncle   . Lung cancer Paternal Uncle   . Lung cancer Paternal Aunt     Past Surgical History:  Procedure Laterality Date  . ABDOMINAL HYSTERECTOMY  1999   WITH BLADDER REPAIR FOR PROPLAPSE-HAS OVARIES (NO CA)  . BREAST LUMPECTOMY WITH RADIOACTIVE SEED AND SENTINEL LYMPH NODE BIOPSY Right 11/02/2014   Procedure: RIGHT BREAST LUMPECTOMY WITH RADIOACTIVE SEED AND RIGHT AXILLARY SENTINEL LYMPH NODE BIOPSY;  Surgeon: Excell Seltzer, MD;  Location: [place];  Service: General;  Laterality: Right;  . COLONOSCOPY    . KNEE CARTILAGE SURGERY    . LIPOMA EXCISION N/A 03/23/2015   Procedure: EXCISION LIPOMA ABDOMINAL [facility];  Surgeon: Excell Seltzer, MD;  Location: [place];  Service: General;  Laterality: N/A;  . STAPEDECTOMY Right   . SVT ablation  2000   Social History   Occupational History  . Not on file.   Social History Main Topics  . Smoking status: Former Smoker    Packs/day: 2.00    Years: 6.00    Quit date: 12/16/1977  . Smokeless tobacco: Never Used  . Alcohol use 2.4 - 3.0 oz/week    4 - 5 Glasses of wine per week     Comment: red wine daily  . Drug use: No  . Sexual activity: Yes    Birth control/ protection: Surgical

## 2016-05-09 NOTE — Telephone Encounter (Signed)
Patient was seen in the office today, had injection and we will see her back in one week to "reschedule" MRI if needed

## 2016-05-10 ENCOUNTER — Inpatient Hospital Stay: Admission: RE | Admit: 2016-05-10 | Payer: 59 | Source: Ambulatory Visit

## 2016-05-10 ENCOUNTER — Encounter (INDEPENDENT_AMBULATORY_CARE_PROVIDER_SITE_OTHER): Payer: Self-pay

## 2016-05-10 ENCOUNTER — Ambulatory Visit (INDEPENDENT_AMBULATORY_CARE_PROVIDER_SITE_OTHER): Payer: 59 | Admitting: Orthopaedic Surgery

## 2016-05-14 ENCOUNTER — Telehealth: Payer: Self-pay | Admitting: Pulmonary Disease

## 2016-05-14 NOTE — Telephone Encounter (Signed)
Pt is taking the [employer] Cpap machine Just Juluis Rainier [phone] this needs to go to [place]

## 2016-05-14 NOTE — Telephone Encounter (Signed)
lmomtcb x1 

## 2016-05-15 NOTE — Telephone Encounter (Signed)
[name] [name] is aware of this and order has been adjusted to [facility]

## 2016-05-16 ENCOUNTER — Ambulatory Visit (INDEPENDENT_AMBULATORY_CARE_PROVIDER_SITE_OTHER): Payer: 59 | Admitting: Orthopaedic Surgery

## 2016-06-06 ENCOUNTER — Ambulatory Visit (INDEPENDENT_AMBULATORY_CARE_PROVIDER_SITE_OTHER): Payer: 59 | Admitting: Orthopaedic Surgery

## 2016-06-07 ENCOUNTER — Telehealth: Payer: Self-pay | Admitting: Pulmonary Disease

## 2016-06-07 ENCOUNTER — Ambulatory Visit: Payer: 59 | Admitting: Hematology and Oncology

## 2016-06-07 NOTE — Telephone Encounter (Signed)
Spoke with pt, who states [place] will be [place] 6 [place]. Pt was concerned about her not being able to get a download on her machine. Pt states she will be taking her machine with her, and she plans to wear it nightly. I spoke with Aldona Bar with [employer], who confirmed that pt's machine has a SD card, therefore it will still document her compliance. LM to make pt aware of this.

## 2016-06-07 NOTE — Telephone Encounter (Signed)
lmtcb X1 

## 2016-06-08 NOTE — Telephone Encounter (Signed)
lmtcb x2 for pt. 

## 2016-06-12 NOTE — Telephone Encounter (Signed)
lmtcb x2 for pt. 

## 2016-06-13 ENCOUNTER — Ambulatory Visit: Payer: 59 | Admitting: Hematology and Oncology

## 2016-06-18 NOTE — Telephone Encounter (Signed)
lmom to make the pt aware of the compliance of her machine since it does have the SD card.

## 2016-06-25 ENCOUNTER — Telehealth: Payer: Self-pay | Admitting: Hematology and Oncology

## 2016-06-25 NOTE — Telephone Encounter (Signed)
left voicemail to advise patient that appointment has been changed from 07/25/16 to 07/26/16 at 9:30am

## 2016-07-18 ENCOUNTER — Ambulatory Visit (INDEPENDENT_AMBULATORY_CARE_PROVIDER_SITE_OTHER): Payer: 59 | Admitting: Orthopaedic Surgery

## 2016-07-25 ENCOUNTER — Ambulatory Visit: Payer: 59 | Admitting: Hematology and Oncology

## 2016-07-26 ENCOUNTER — Ambulatory Visit (HOSPITAL_BASED_OUTPATIENT_CLINIC_OR_DEPARTMENT_OTHER): Payer: 59 | Admitting: Hematology and Oncology

## 2016-07-26 ENCOUNTER — Encounter: Payer: Self-pay | Admitting: Hematology and Oncology

## 2016-07-26 DIAGNOSIS — C50411 Malignant neoplasm of upper-outer quadrant of right female breast: Secondary | ICD-10-CM

## 2016-07-26 NOTE — Assessment & Plan Note (Signed)
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 Hot flashes Are now improved to 2-3 times per day 2. Slightly more moody than usual Patient plans to go to [place] in October for her niece's wedding and is very excited about it.  Breast Cancer Surveillance: 1. Breast exam 12/25/2015 tenderness in the right breast: Normal 2. Mammogram May 2017 No abnormalities. Postsurgical changes.    Return to clinic in 1 year

## 2016-07-26 NOTE — Progress Notes (Signed)
Patient Care Team: Midge Minium, MD as PCP - General (Family Medicine) Nicholas Lose, MD as Consulting Physician (Hematology and Oncology)  DIAGNOSIS:  Encounter Diagnosis  Name Primary?  . Primary cancer of upper outer quadrant of right breast ([facility])     SUMMARY OF ONCOLOGIC HISTORY:   Primary cancer of upper outer quadrant of right breast ([facility])   09/30/2014 Initial Diagnosis    Right breast invasive ductal carcinoma grade 1, Ki-67 5%, ER positive, PR positive, HER-2 negative, 1.4 cm mass at 11:30 position no lymph nodes by ultrasound T1 cN0 M0 stage IA clinical stage      11/02/2014 Surgery    Right lumpectomy: Invasive ductal carcinoma with extracellular mucin, grade 2/3, 1.8 cm, 0/2 lymph nodes negative, ER 98%, PR 63%, HER-2 negative ratio 1.58, Ki-67 7%, Oncotype DX score 12, 8% risk of recurrence, T1 cN0 stage IA      12/28/2014 - 01/27/2015 Radiation Therapy    Adjuvant radiation therapy with boost      03/01/2015 -  Anti-estrogen oral therapy    Anastrozole 1 mg daily       CHIEF COMPLIANT: Follow-up on anastrozole therapy  INTERVAL HISTORY: [name] [name] is a 61 year old with above-mentioned is right breast cancer treated with lumpectomy and radiation is currently on anastrozole. She was having lots of hot flashes but recently she had started taking half a tablet twice a day and that seems to help resolve the hot flashes. She was recently in [place] taking care of her son who had an ankle injury. She just returned back from this medication. She denies any pain lumps or nodules in the breast.  REVIEW OF SYSTEMS:   Constitutional: Denies fevers, chills or abnormal weight loss Eyes: Denies blurriness of vision Ears, nose, mouth, throat, and face: Denies mucositis or sore throat Respiratory: Denies cough, dyspnea or wheezes Cardiovascular: Denies palpitation, chest discomfort Gastrointestinal:  Denies nausea, heartburn or change in bowel habits Skin: Denies abnormal  skin rashes Lymphatics: Denies new lymphadenopathy or easy bruising Neurological:Denies numbness, tingling or new weaknesses Behavioral/Psych: Mood is stable, no new changes  Extremities: No lower extremity edema Breast:  denies any pain or lumps or nodules in either breasts All other systems were reviewed with the patient and are negative.  I have reviewed the past medical history, past surgical history, social history and family history with the patient and they are unchanged from previous note.  ALLERGIES:  has No Known Allergies.  MEDICATIONS:  Current Outpatient Prescriptions  Medication Sig Dispense Refill  . anastrozole (ARIMIDEX) 1 MG tablet Take 1 tablet (1 mg total) by mouth daily. 90 tablet 3  . Cholecalciferol (VITAMIN D3) 2000 units TABS Take 2,000 Units by mouth daily.    . meloxicam (MOBIC) 15 MG tablet Take 1 tablet (15 mg total) by mouth daily. 30 tablet 1  . methylPREDNISolone (MEDROL DOSEPAK) 4 MG TBPK tablet Steroid taper orally over 6 days 21 tablet 0  . Multiple Vitamin (MULTIVITAMIN) tablet Take 1 tablet by mouth daily.    Marland Kitchen UNABLE TO FIND Dispense compression sleeve 20-30 mm/Hg for this pt with history of breast cancer s/p surgery. 1 each 0  . vitamin E 1000 UNIT capsule Take 1,000 Units by mouth daily.     Current Facility-Administered Medications  Medication Dose Route Frequency Provider Last Rate Last Dose  . 0.9 %  sodium chloride infusion  500 mL Intravenous Continuous Nelida Meuse III, MD        PHYSICAL EXAMINATION: ECOG  PERFORMANCE STATUS: 1 - Symptomatic but completely ambulatory  Vitals:   07/26/16 0935  BP: 121/77  Pulse: 72  Resp: 18  Temp: 97.4 F (36.3 C)   Filed Weights   07/26/16 0935  Weight: 184 lb 3.2 oz (83.6 kg)    GENERAL:alert, no distress and comfortable SKIN: skin color, texture, turgor are normal, no rashes or significant lesions EYES: normal, Conjunctiva are pink and non-injected, sclera clear OROPHARYNX:no exudate, no  erythema and lips, buccal mucosa, and tongue normal  NECK: supple, thyroid normal size, non-tender, without nodularity LYMPH:  no palpable lymphadenopathy in the cervical, axillary or inguinal LUNGS: clear to auscultation and percussion with normal breathing effort HEART: regular rate & rhythm and no murmurs and no lower extremity edema ABDOMEN:abdomen soft, non-tender and normal bowel sounds MUSCULOSKELETAL:no cyanosis of digits and no clubbing  NEURO: alert & oriented x 3 with fluent speech, no focal motor/sensory deficits EXTREMITIES: No lower extremity edema BREAST: No palpable masses or nodules in either right or left breasts. No palpable axillary supraclavicular or infraclavicular adenopathy no breast tenderness or nipple discharge. (exam performed in the presence of a chaperone)  LABORATORY DATA:  I have reviewed the data as listed   Chemistry      Component Value Date/Time   NA 135 11/02/2015 1036   K 4.7 11/02/2015 1036   CL 100 11/02/2015 1036   CO2 30 11/02/2015 1036   BUN 19 11/02/2015 1036   CREATININE 0.68 11/02/2015 1036      Component Value Date/Time   CALCIUM 9.6 11/02/2015 1036   ALKPHOS 50 11/02/2015 1036   AST 15 11/02/2015 1036   ALT 12 11/02/2015 1036   BILITOT 0.8 11/02/2015 1036       Lab Results  Component Value Date   WBC 4.2 12/29/2015   HGB 12.9 12/29/2015   HCT 38.4 12/29/2015   MCV 93.7 12/29/2015   PLT 224.0 12/29/2015   NEUTROABS 2.2 12/29/2015    ASSESSMENT & PLAN:  Primary cancer of upper outer quadrant of right breast Right lumpectomy 11/02/2014: Invasive ductal carcinoma with extracellular mucin, grade 2/3, 1.8 cm, 0/2 lymph nodes negative, ER 98%, PR 63%, HER-2 negative ratio 1.58, Ki-67 7%, Oncotype DX score 12, 8% risk of recurrence, T1 cN0 stage IA status post radiation completed 01/27/2015  Current treatment: Anastrozole adjuvant therapy 1 mg daily 5 years started October 2016 Anastrozole toxicities:  1. Hot flashes Have  resolved since she started taking half a tablet twice a day 2. Slightly more moody than usual Patient was in [place] for the last 6 weeks and just returned back.  Breast Cancer Surveillance: 1. Breast exam 12/25/2015 tenderness in the right breast: Normal 2. Mammogram May 2017 No abnormalities. Postsurgical changes.    Return to clinic in 1 year   I spent 25 minutes talking to the patient of which more than half was spent in counseling and coordination of care.  No orders of the defined types were placed in this encounter.  The patient has a good understanding of the overall plan. she agrees with it. she will call with any problems that may develop before the next visit here.   Rulon Eisenmenger, MD 07/26/16

## 2016-08-06 ENCOUNTER — Encounter: Payer: Self-pay | Admitting: Pulmonary Disease

## 2016-08-06 ENCOUNTER — Ambulatory Visit (INDEPENDENT_AMBULATORY_CARE_PROVIDER_SITE_OTHER): Payer: 59 | Admitting: Pulmonary Disease

## 2016-08-06 VITALS — BP 120/80 | HR 80 | Ht 69.0 in | Wt 181.0 lb

## 2016-08-06 DIAGNOSIS — Z9989 Dependence on other enabling machines and devices: Secondary | ICD-10-CM

## 2016-08-06 DIAGNOSIS — G4733 Obstructive sleep apnea (adult) (pediatric): Secondary | ICD-10-CM

## 2016-08-06 NOTE — Patient Instructions (Signed)
Will arrange for chin strap to use with CPAP mask  Follow up in 1 year 

## 2016-08-06 NOTE — Progress Notes (Signed)
Current Outpatient Prescriptions on File Prior to Visit  Medication Sig  . anastrozole (ARIMIDEX) 1 MG tablet Take 1 tablet (1 mg total) by mouth daily.  . Cholecalciferol (VITAMIN D3) 2000 units TABS Take 2,000 Units by mouth daily.  . Multiple Vitamin (MULTIVITAMIN) tablet Take 1 tablet by mouth daily.  Marland Kitchen UNABLE TO FIND Dispense compression sleeve 20-30 mm/Hg for this pt with history of breast cancer s/p surgery.  . vitamin E 1000 UNIT capsule Take 1,000 Units by mouth daily.  . meloxicam (MOBIC) 15 MG tablet Take 1 tablet (15 mg total) by mouth daily. (Patient not taking: Reported on 08/06/2016)  . methylPREDNISolone (MEDROL DOSEPAK) 4 MG TBPK tablet Steroid taper orally over 6 days (Patient not taking: Reported on 08/06/2016)   Current Facility-Administered Medications on File Prior to Visit  Medication  . 0.9 %  sodium chloride infusion     Chief Complaint  Patient presents with  . Follow-up    Follows for OSA. Pt would like to discuss air pressure aside from that machine is fine. Pt does not need any supplies     Sleep tests HST 04/16/16 >> AHI 14.2, SaO2 low 77% Auto CPAP 07/04/16 to 08/02/16 >> used on 30 of 30 nights with average 7 hrs 54 min.  Average AHI 1.5 with median CPAP 7 and 95 th percentile CPAP 10 cm H2O  Past medical history Breast cancer 2016, SVT  Past surgical history, Family history, Social history, Allergies all reviewed.  Vital Signs BP 120/80 (BP Location: Left Arm, Patient Position: Sitting, Cuff Size: Large)   Pulse 80   Ht 5\' 9"  (1.753 m)   Wt 181 lb (82.1 kg)   SpO2 98%   BMI 26.73 kg/m   History of Present Illness [name] [name] is a 61 y.o. female with obstructive sleep apnea.  She loves her CPAP.  She is sleeping much better.  She noticed that when she traveled to [place], she didn't have to use as much water in the machine.  She uses nasal mask >> her husband says she breaths through her mouth sometimes and this makes noise.  She doesn't  always notice this.  Physical Exam  General - pleasant ENT - no sinus tenderness, no oral exudate, MP 4, enlarged tongue Cardiac - regular, no murmur Chest - no wheeze Back - no tenderness Abd - soft, non tender Ext - no edema Neuro - normal strength Skin - no rashes Psych - normal mood   Assessment/Plan  Obstructive sleep apnea. - she is compliant with CPAP and reports benefit - continue auto CPAP - will arrange for chin strap to use with nasal mask   Patient Instructions  Will arrange for chin strap to use with CPAP mask  Follow up in 1 year    Chesley Mires, MD [facility] Pager:  [phone] 08/06/2016, 11:53 AM

## 2016-08-08 ENCOUNTER — Ambulatory Visit (INDEPENDENT_AMBULATORY_CARE_PROVIDER_SITE_OTHER): Payer: 59 | Admitting: Orthopaedic Surgery

## 2016-08-08 ENCOUNTER — Encounter (INDEPENDENT_AMBULATORY_CARE_PROVIDER_SITE_OTHER): Payer: Self-pay | Admitting: Orthopaedic Surgery

## 2016-08-08 DIAGNOSIS — G8929 Other chronic pain: Secondary | ICD-10-CM

## 2016-08-08 DIAGNOSIS — M25562 Pain in left knee: Secondary | ICD-10-CM | POA: Diagnosis not present

## 2016-08-08 NOTE — Progress Notes (Signed)
The patient is well-known to me. She has left knee pain that is worsened over the last 6 months. This started about September 2017. I finally place a steroid injection her knee around December. Should the steroid helped greatly for about a week. She has tried and failed all forms conservative treatment including rest, ice, heat, time, anti-inflammatories, activity modification, quad strengthening exercises and an intra-articular injection. She gets locking catching in her knee and hurts in the posterior lateral aspect. She's been sitting for long period time and goes to get up it hurts quite a bit. It catches on her. I have already obtained x-rays of her knee before that showed actually very well maintained joint space in her knee.  On examination of her left knee she has a positive Murray sign to lateral side. She has posterior lateral corner tenderness of that knee as well. There is a negative Tinel sign around the knee or the peroneal tendons. The knee feels ligaments stable.  Given the worsening mechanical symptoms of her knee combined with the failure of conservative treatment and MRI is warranted to assess for a meniscal tear. Again she has tried all forms of therapy over the last 6 months and treatment modalities and still having these mechanical symptoms in spite of this treatment. We will see her back in 2 weeks no for an MRI will been attained of her left knee

## 2016-08-09 ENCOUNTER — Other Ambulatory Visit (INDEPENDENT_AMBULATORY_CARE_PROVIDER_SITE_OTHER): Payer: Self-pay | Admitting: Radiology

## 2016-08-16 ENCOUNTER — Ambulatory Visit
Admission: RE | Admit: 2016-08-16 | Discharge: 2016-08-16 | Disposition: A | Discharge status: Home / Self Care | Payer: 59 | Source: Ambulatory Visit | Attending: Orthopaedic Surgery | Admitting: Orthopaedic Surgery

## 2016-08-16 DIAGNOSIS — M25562 Pain in left knee: Secondary | ICD-10-CM

## 2016-08-20 ENCOUNTER — Ambulatory Visit (INDEPENDENT_AMBULATORY_CARE_PROVIDER_SITE_OTHER): Payer: 59 | Admitting: Orthopaedic Surgery

## 2016-08-20 DIAGNOSIS — M25562 Pain in left knee: Secondary | ICD-10-CM | POA: Diagnosis not present

## 2016-08-20 DIAGNOSIS — G8929 Other chronic pain: Secondary | ICD-10-CM

## 2016-08-20 MED ORDER — METHYLPREDNISOLONE ACETATE 40 MG/ML IJ SUSP
40.0000 mg | INTRAMUSCULAR | Status: AC | PRN
  Administered 2016-08-20: 40 mg via INTRA_ARTICULAR

## 2016-08-20 NOTE — Progress Notes (Signed)
Office Visit Note   Patient: [name] [name]           Date of Birth: 1955-12-15           MRN: [id number] Visit Date: 08/20/2016              Requested by: Midge Minium, MD [address] [place], [place] [address] PCP: Annye Asa, MD   Assessment & Plan: Visit Diagnoses:  1. Chronic pain of left knee     Plan: At this point an arthroscopic intervention of her left knee is warranted and she agrees with this as well. She is heading out of the state for a funeral this weeks I do feel that she'll benefit from another injection in her knee and she did after prepping better alcohol. She would like an arthroscopic surgery and intervention as well to perform a partial synovectomy and partial lateral meniscectomy. I showed her knee model and went over the MRI in detail and explained in detail with the surgery involves including a detailed discussion of the risk and benefits of surgery. We'll work on getting this set up and then we would see her back one week postoperative. All questions were encouraged and answered  Follow-Up Instructions: Return for 1 week post-op.   Orders:  Orders Placed This Encounter  Procedures  . Large Joint Injection/Arthrocentesis   No orders of the defined types were placed in this encounter.     Procedures: Large Joint Inj Date/Time: 08/20/2016 4:58 PM Performed by: Mcarthur Rossetti Authorized by: Mcarthur Rossetti   Location:  Knee Site:  L knee Ultrasound Guidance: No   Fluoroscopic Guidance: No   Arthrogram: No   Medications:  40 mg methylPREDNISolone acetate 40 MG/ML     Clinical Data: No additional findings.   Subjective: No chief complaint on file.  HPI The patient returns for follow-up of an MRI of her left knee. She's been having chronic knee problems but only since September. She is having locking catching that knee in hurting mainly on the lateral joint line and around the posterior aspect of her knee. We  obtained an MRI of the failure conservative treatment. She's had at least 2 steroid injections in her knee which is helped for about a week or so. Review of Systems Denies any chest pain, shortness of breath, fever, chills, nausea, vomiting  Objective: Vital Signs: There were no vitals taken for this visit.  Physical Exam He is alert and oriented 3 and in no acute distress Ortho Exam Examination of her left knee shows some slight warmth. There is only a mild effusion. She has pain past 90 of flexion is mainly on the medial joint line there is positive Murray sign on that side of her knee as well. Specialty Comments:  No specialty comments available.  Imaging: No results found.  MRI is reviewed with her and does show a moderate effusion of the knee was significant synovitis. There is also anterior horn lateral meniscal tear. There is moderate chondrosis throughout the knee but no areas of full-thickness cartilage loss PMFS History: Patient Active Problem List   Diagnosis Date Noted  . OSA (obstructive sleep apnea) 04/17/2016  . Polyarthralgia 12/29/2015  . Physical exam 11/02/2015  . Primary cancer of upper outer quadrant of right breast ([facility]) 10/22/2014  . Palpitations 12/25/2012  . History: SVT (supraventricular tachycardia), status post RFA ablation 12/25/2012   Past Medical History:  Diagnosis Date  . Cancer ([facility]) 2016  right breast cancer  . Cancer of right breast [facility])    last radiation was 01/2015  . Dysrhythmia    no problems since ablation 2004  . Lipoma of abdominal wall   . OSA (obstructive sleep apnea) 04/17/2016  . Paroxysmal SVT (supraventricular tachycardia) ([facility]) 2004   Status post RFA ABLATION  . S/P radiation therapy 12/28/2014 through 01/27/2015   Right breast 4250 cGy 17 sessions, right breast boost 1200 cGy in 6 sessions     Family History  Problem Relation Age of Onset  . CVA  Mother   . Bladder Cancer Father   . CVA Maternal Grandmother     x2  . Heart attack Maternal Grandfather   . Leukemia Paternal Grandfather   . Acute lymphoblastic leukemia Paternal Grandfather   . Supraventricular tachycardia Sister   . Supraventricular tachycardia Brother   . Supraventricular tachycardia Son     passed at 31  . Supraventricular tachycardia Grandchild     4 grandchildren  . Ovarian cancer Paternal Aunt   . Lung cancer Paternal Uncle   . Lung cancer Paternal Uncle   . Lung cancer Paternal Aunt     Past Surgical History:  Procedure Laterality Date  . ABDOMINAL HYSTERECTOMY  1999   WITH BLADDER REPAIR FOR PROPLAPSE-HAS OVARIES (NO CA)  . BREAST LUMPECTOMY WITH RADIOACTIVE SEED AND SENTINEL LYMPH NODE BIOPSY Right 11/02/2014   Procedure: RIGHT BREAST LUMPECTOMY WITH RADIOACTIVE SEED AND RIGHT AXILLARY SENTINEL LYMPH NODE BIOPSY;  Surgeon: Excell Seltzer, MD;  Location: [facility];  Service: General;  Laterality: Right;  . COLONOSCOPY    . KNEE CARTILAGE SURGERY    . LIPOMA EXCISION N/A 03/23/2015   Procedure: EXCISION LIPOMA ABDOMINAL [facility];  Surgeon: Excell Seltzer, MD;  Location: [facility];  Service: General;  Laterality: N/A;  . STAPEDECTOMY Right   . SVT ablation  2000   Social History   Occupational History  . Not on file.   Social History Main Topics  . Smoking status: Former Smoker    Packs/day: 2.00    Years: 6.00    Quit date: 12/16/1977  . Smokeless tobacco: Never Used  . Alcohol use 2.4 - 3.0 oz/week    4 - 5 Glasses of wine per week     Comment: red wine daily  . Drug use: No  . Sexual activity: Yes    Birth control/ protection: Surgical

## 2016-08-22 ENCOUNTER — Ambulatory Visit (INDEPENDENT_AMBULATORY_CARE_PROVIDER_SITE_OTHER): Payer: 59 | Admitting: Orthopaedic Surgery

## 2016-08-31 ENCOUNTER — Telehealth (INDEPENDENT_AMBULATORY_CARE_PROVIDER_SITE_OTHER): Payer: Self-pay

## 2016-08-31 NOTE — Telephone Encounter (Signed)
Patient left me a voice mail for a call back to schedule surgery.  I called patient and left her a voice mail to call me back.

## 2016-09-03 ENCOUNTER — Telehealth (INDEPENDENT_AMBULATORY_CARE_PROVIDER_SITE_OTHER): Payer: Self-pay | Admitting: *Deleted

## 2016-09-03 NOTE — Telephone Encounter (Signed)
Patient called and would like to schedule her left knee surgery please. Her CB # [phone]. Thank you

## 2016-09-10 ENCOUNTER — Ambulatory Visit (INDEPENDENT_AMBULATORY_CARE_PROVIDER_SITE_OTHER): Payer: 59 | Admitting: Orthopaedic Surgery

## 2016-09-19 ENCOUNTER — Telehealth (INDEPENDENT_AMBULATORY_CARE_PROVIDER_SITE_OTHER): Payer: Self-pay

## 2016-09-19 NOTE — Telephone Encounter (Signed)
Patient called stating knee is feeling better since most recent injection.  She wants to hold off on knee surgery for now.  Canceled for tomorrow.  She will call back if condition worsens and she wants to reschedule.

## 2016-09-27 ENCOUNTER — Inpatient Hospital Stay (INDEPENDENT_AMBULATORY_CARE_PROVIDER_SITE_OTHER): Payer: 59 | Admitting: Physician Assistant

## 2016-10-17 LAB — HM MAMMOGRAPHY

## 2016-10-23 ENCOUNTER — Encounter: Payer: Self-pay | Admitting: General Practice

## 2016-10-29 ENCOUNTER — Telehealth: Payer: Self-pay | Admitting: Pulmonary Disease

## 2016-10-29 NOTE — Telephone Encounter (Signed)
I printed DL from [facility] and placed in [facility]'s lookat  Pt aware we will call her once he reviews it  Please advise thanks

## 2016-10-31 NOTE — Telephone Encounter (Signed)
CPAP 09/29/16 to 10/28/16 >> used on 30 of 30 nights with average 8 hrs 12 min.  Average AHI 1.5 with median CPAP 7 and 95 th percentile CPAP 9 cm H2O  Please inform pt that CPAP report shows good control of sleep apnea.

## 2016-10-31 NOTE — Telephone Encounter (Signed)
Pt is aware of below message and voiced her understanding. Nothing further needed.  

## 2016-11-06 ENCOUNTER — Telehealth: Payer: Self-pay | Admitting: Pulmonary Disease

## 2016-11-06 NOTE — Telephone Encounter (Signed)
I spoke with Dr Annamaria Boots about this, as VS does not have Epic access at the moment  Per [facility]- okay to use the CPAP with sinus problems. Some pt's report HA, and others do fine. She should use the CPAP as long as she can tolerate it. I spoke with the pt and notified of this response. She verbalized understanding and nothing further needed

## 2016-11-06 NOTE — Telephone Encounter (Signed)
Spoke with the pt  She states that she has some sinus congestion and her ears are stopped up  She has been taking otc meds= flonase, sudafed for this  Wants to know if she should use her CPAP or not  She states she has heard to not use CPAP while you have these symptoms  She used it last night and did fine  She wants to know what VS thinks  Please advise thanks!

## 2016-11-14 ENCOUNTER — Ambulatory Visit (INDEPENDENT_AMBULATORY_CARE_PROVIDER_SITE_OTHER): Payer: 59 | Admitting: Physician Assistant

## 2016-11-14 DIAGNOSIS — M1712 Unilateral primary osteoarthritis, left knee: Secondary | ICD-10-CM | POA: Diagnosis not present

## 2016-11-14 MED ORDER — METHYLPREDNISOLONE ACETATE 40 MG/ML IJ SUSP
40.0000 mg | INTRAMUSCULAR | Status: AC | PRN
  Administered 2016-11-14: 40 mg via INTRA_ARTICULAR

## 2016-11-14 MED ORDER — LIDOCAINE HCL 1 % IJ SOLN
3.0000 mL | INTRAMUSCULAR | Status: AC | PRN
  Administered 2016-11-14: 3 mL

## 2016-11-14 NOTE — Progress Notes (Signed)
   Procedure Note  Patient: [name] [name]             Date of Birth: 1956-01-07           MRN: [id number]             Visit Date: 11/14/2016 History of present illness Mrs. Kuwahara returns today follow-up of her left knee. She states the injection back in March helped she did like another injection knee today. She was contemplating a left knee arthroscopy with synovectomy and partial lateral meniscectomy. But at this point light put that off. She's having no mechanical symptoms of the knee. She's had no new injury.  Physical exam: Left knee no effusion. She has tenderness over the lateral joint line of the knee only. No instability valgus varus stressing.  Procedures: Visit Diagnoses: Primary osteoarthritis of left knee  Large Joint Inj Date/Time: 11/14/2016 10:15 PM Performed by: Pete Pelt Authorized by: Pete Pelt   Consent Given by:  Patient Indications:  Pain Location:  Knee Site:  L knee Needle Size:  25 G Needle Length:  3.5 inches Approach:  Anterolateral Ultrasound Guidance: No   Fluoroscopic Guidance: No   Medications:  40 mg methylPREDNISolone acetate 40 MG/ML; 3 mL lidocaine 1 % Aspiration Attempted: No   Patient tolerance:  Patient tolerated the procedure well with no immediate complications  Plan: Continue quad strengthening. She develops any mechanical symptoms, effusion or increased pain she'll return. Otherwise as needed.

## 2016-11-19 ENCOUNTER — Ambulatory Visit (INDEPENDENT_AMBULATORY_CARE_PROVIDER_SITE_OTHER): Payer: 59 | Admitting: Physician Assistant

## 2016-11-19 ENCOUNTER — Encounter: Payer: Self-pay | Admitting: Physician Assistant

## 2016-11-19 VITALS — BP 120/80 | HR 71 | Temp 98.6°F | Ht 69.0 in | Wt 170.5 lb

## 2016-11-19 DIAGNOSIS — J0100 Acute maxillary sinusitis, unspecified: Secondary | ICD-10-CM | POA: Diagnosis not present

## 2016-11-19 DIAGNOSIS — S80862A Insect bite (nonvenomous), left lower leg, initial encounter: Secondary | ICD-10-CM | POA: Diagnosis not present

## 2016-11-19 DIAGNOSIS — S30861A Insect bite (nonvenomous) of abdominal wall, initial encounter: Secondary | ICD-10-CM

## 2016-11-19 DIAGNOSIS — W57XXXA Bitten or stung by nonvenomous insect and other nonvenomous arthropods, initial encounter: Secondary | ICD-10-CM

## 2016-11-19 DIAGNOSIS — Z9109 Other allergy status, other than to drugs and biological substances: Secondary | ICD-10-CM | POA: Diagnosis not present

## 2016-11-19 MED ORDER — DOXYCYCLINE HYCLATE 100 MG PO TABS: 100.0000 mg | ORAL_TABLET | Freq: Two times a day (BID) | ORAL | 0 refills | Status: DC

## 2016-11-19 MED ORDER — PREDNISONE 20 MG PO TABS: 20.0000 mg | ORAL_TABLET | Freq: Every day | ORAL | 0 refills | Status: DC

## 2016-11-19 NOTE — Patient Instructions (Signed)
It was great meeting you!  Take the prednisone daily, take with food.  Take the antibiotic doxycycline daily, take with food.  Continue flonase.  Consider a daily antihistamine, such as allegra, zyrtec, claritin.  Follow-up with PCP if symptoms worsen or persist.

## 2016-11-19 NOTE — Progress Notes (Signed)
[name] [name] is a 61 y.o. female here for cough and hearing loss right ear.  I acted as a Education administrator for [employer], PA-C Anselmo Pickler, LPN   History of Present Illness:   Chief Complaint  Patient presents with  . Cough    Hearing loss in right ear x 3 weeks. Pt had this same time last year and has seen ENT in the past. Denies pain or drainage.   Cough  This is a new problem. Episode onset: x 4-5 days. The problem has been gradually worsening. The problem occurs constantly (dry, non-productive). Associated symptoms include ear congestion, nasal congestion, shortness of breath and wheezing. Pertinent negatives include no chest pain, chills, ear pain, fever, headaches or postnasal drip. The symptoms are aggravated by cold air. Treatments tried: Nasal spray x 2 months, Sudafed x 1 week. The treatment provided no relief. Her past medical history is significant for environmental allergies.   When this happened last year, she went to her Audiologist and bought Sudafed, Flonase and started Acupuncture.  Has seen three ENT and all of them told her that she had hearing loss and recommended that she go to an Audiologist. Has been using Flonase for the past 2 months but reports that when she went on a camping trip she was possibly exposed to "dust mites" when she has an allergy of. Has taken a week of Sudafed without relief, and has also gone to the Acupuncturist. At symptom-onset, 3 weeks ago, saw audiologist who she adjusted her hearing aid.  Also, she reports that she had two tick bites about 2-3 weeks ago, her husband removed her ticks and feels as though she got it all out. Denies HA, unusual joint/muscle pain. Does endorse some fatigue. Tick bites were located to the LLQ of abdomen and back of left leg.   Past Medical History:  Diagnosis Date  . Cancer [facility]) 2016   right breast cancer  . Cancer of right breast [facility])    last radiation was 01/2015  . Dysrhythmia    no problems since  ablation 2004  . Lipoma of abdominal wall   . OSA (obstructive sleep apnea) 04/17/2016  . Paroxysmal SVT (supraventricular tachycardia) ([place]) 2004   Status post RFA ABLATION  . S/P radiation therapy 12/28/2014 through 01/27/2015   Right breast 4250 cGy 17 sessions, right breast boost 1200 cGy in 6 sessions      Social History   Social History  . Marital status: Married    Spouse name: N/A  . Number of children: N/A  . Years of education: N/A   Occupational History  . Not on file.   Social History Main Topics  . Smoking status: Former Smoker    Packs/day: 2.00    Years: 6.00    Quit date: 12/16/1977  . Smokeless tobacco: Never Used  . Alcohol use 2.4 - 3.0 oz/week    4 - 5 Glasses of wine per week     Comment: red wine daily  . Drug use: No  . Sexual activity: Yes    Birth control/ protection: Surgical   Other Topics Concern  . Not on file   Social History Narrative   She is married to [employer]. They have 2 children, and one son died at age 37 with complications of an arrhythmia -- presumably yet record aorta.   Is a former smoker -- quit over 30 years ago. She maybe has one or 2 vessel [place] periodically. Currently unemployed. She recently returned related  to narcotic from [facility].    Past Surgical History:  Procedure Laterality Date  . ABDOMINAL HYSTERECTOMY  1999   WITH BLADDER REPAIR FOR PROPLAPSE-HAS OVARIES (NO CA)  . BREAST LUMPECTOMY WITH RADIOACTIVE SEED AND SENTINEL LYMPH NODE BIOPSY Right 11/02/2014   Procedure: RIGHT BREAST LUMPECTOMY WITH RADIOACTIVE SEED AND RIGHT AXILLARY SENTINEL LYMPH NODE BIOPSY;  Surgeon: Excell Seltzer, MD;  Location: [place];  Service: General;  Laterality: Right;  . COLONOSCOPY    . KNEE CARTILAGE SURGERY    . LIPOMA EXCISION N/A 03/23/2015   Procedure: EXCISION LIPOMA ABDOMINAL WALLL;  Surgeon: Excell Seltzer, MD;  Location: [place];  Service: General;  Laterality: N/A;  . STAPEDECTOMY Right   . SVT ablation  2000    Family History  Problem Relation Age of Onset  . CVA Mother   . Bladder Cancer Father   . CVA Maternal Grandmother        x2  . Heart attack Maternal Grandfather   . Leukemia Paternal Grandfather   . Acute lymphoblastic leukemia Paternal Grandfather   . Supraventricular tachycardia Sister   . Supraventricular tachycardia Brother   . Supraventricular tachycardia Son        passed at 61  . Supraventricular tachycardia Grandchild        4 grandchildren  . Ovarian cancer Paternal Aunt   . Lung cancer Paternal Uncle   . Lung cancer Paternal Uncle   . Lung cancer Paternal Aunt     No Known Allergies  Current Medications:   Current Outpatient Prescriptions:  .  anastrozole (ARIMIDEX) 1 MG tablet, Take 1 tablet (1 mg total) by mouth daily., Disp: 90 tablet, Rfl: 3 .  Cholecalciferol (VITAMIN D3) 2000 units TABS, Take 2,000 Units by mouth daily., Disp: , Rfl:  .  UNABLE TO FIND, Dispense compression sleeve 20-30 mm/Hg for this pt with history of breast cancer s/p surgery., Disp: 1 each, Rfl: 0 .  vitamin E 1000 UNIT capsule, Take 1,000 Units by mouth daily., Disp: , Rfl:  .  doxycycline (VIBRA-TABS) 100 MG tablet, Take 1 tablet (100 mg total) by mouth 2 (two) times daily., Disp: 20 tablet, Rfl: 0 .  Multiple Vitamin (MULTIVITAMIN) tablet, Take 1 tablet by mouth daily., Disp: , Rfl:  .  predniSONE (DELTASONE) 20 MG tablet, Take 1 tablet (20 mg total) by mouth daily with breakfast., Disp: 5 tablet, Rfl: 0  Current Facility-Administered Medications:  .  0.9 %  sodium chloride infusion, 500 mL, Intravenous, Continuous, Danis, Estill Cotta III, MD   Review of Systems:   Review of Systems  Constitutional: Negative for chills and fever.  HENT: Negative for ear pain and postnasal drip.   Respiratory: Positive for cough, shortness of breath and wheezing.   Cardiovascular: Negative for  chest pain.  Neurological: Negative for headaches.  Endo/Heme/Allergies: Positive for environmental allergies.    Vitals:   Vitals:   11/19/16 0913  BP: 120/80  Pulse: 71  Temp: 98.6 F (37 C)  TempSrc: Oral  SpO2: 98%  Weight: 170 lb 8 oz (77.3 kg)  Height: 5\' 9"  (1.753 m)     Body mass index is 25.18 kg/m.  Physical Exam:   Physical Exam  Constitutional: She appears well-developed. She is cooperative.  Non-toxic appearance. She does not have a sickly appearance. She does not appear ill. No distress.  HENT:  Head: Normocephalic and atraumatic.  Right Ear: Tympanic membrane, external ear and ear canal normal. Tympanic membrane is not  erythematous, not retracted and not bulging.  Left Ear: Tympanic membrane, external ear and ear canal normal. Tympanic membrane is not erythematous, not retracted and not bulging.  Nose: Right sinus exhibits maxillary sinus tenderness. Right sinus exhibits no frontal sinus tenderness. Left sinus exhibits no maxillary sinus tenderness and no frontal sinus tenderness.  Mouth/Throat: Uvula is midline. Posterior oropharyngeal erythema present. No posterior oropharyngeal edema. Tonsils are 0 on the right. Tonsils are 0 on the left. No tonsillar exudate.  Hearing aids to bilateral ears  Eyes: Conjunctivae and lids are normal.  Neck: Trachea normal.  Cardiovascular: Normal rate, regular rhythm, S1 normal, S2 normal, normal heart sounds and normal pulses.   No LE edema  Pulmonary/Chest: Effort normal and breath sounds normal. She has no decreased breath sounds. She has no wheezes. She has no rhonchi. She has no rales.  Lymphadenopathy:    She has no cervical adenopathy.  Neurological: She is alert.  Skin: Skin is warm, dry and intact.  1-cm of well-circumscribed erythema to LLQ no streaking/discharge/necrosis noted; 1-mm scabbed area to mid posterior upper L leg with no streaking/discharge/necrosis noted; no evidence of infection, no tenderness to  palpation  Psychiatric: She has a normal mood and affect. Her speech is normal and behavior is normal.  Nursing note and vitals reviewed.     Assessment and Plan:    Diagnoses and all orders for this visit:  Acute non-recurrent maxillary sinusitis Will treat with doxycycline and short course of low-dose prednisone per orders. I recommended that she follow-up with audiology for any further hearing concerns. Follow-up with PCP if she has any further cough/sinus concerns.  Environmental allergies Will treat with low-dose prednisone per orders. I also recommended a daily antihistamine, which she has never taken before, to help with post-nasal drip and cough.   Tick bite, initial encounter Areas do not appear infected, however I am choosing doxycycline for sinusitis to also cover for this.  Other orders -     predniSONE (DELTASONE) 20 MG tablet; Take 1 tablet (20 mg total) by mouth daily with breakfast. -     doxycycline (VIBRA-TABS) 100 MG tablet; Take 1 tablet (100 mg total) by mouth 2 (two) times daily.    . Reviewed expectations re: course of current medical issues. . Discussed self-management of symptoms. . Outlined signs and symptoms indicating need for more acute intervention. . Patient verbalized understanding and all questions were answered. . See orders for this visit as documented in the electronic medical record. . Patient received an After-Visit Summary.  CMA or LPN served as scribe during this visit. History, Physical, and Plan performed by medical provider. Documentation and orders reviewed and attested to.  Inda Coke, PA-C

## 2017-02-28 ENCOUNTER — Encounter: Payer: Self-pay | Admitting: Orthopaedic Surgery

## 2017-02-28 DIAGNOSIS — M23351 Other meniscus derangements, posterior horn of lateral meniscus, right knee: Secondary | ICD-10-CM

## 2017-03-07 ENCOUNTER — Ambulatory Visit (INDEPENDENT_AMBULATORY_CARE_PROVIDER_SITE_OTHER): Payer: 59 | Admitting: Orthopaedic Surgery

## 2017-03-07 DIAGNOSIS — Z9889 Other specified postprocedural states: Secondary | ICD-10-CM | POA: Insufficient documentation

## 2017-03-07 NOTE — Progress Notes (Signed)
The patient is here and her first postoperative follow-up following a left knee arthroscopy. We found a meniscal root tear of the lateral meniscus of her left knee. The rest of her knee look really good. I share with her that arthroscopy pictures to show her that. Other than swelling and stiffness she is doing well. She did not require any pain medication after surgery.  On exam the knee is has an effusion postoperative which is to be expected. There is no evidence infection. Sutures are removed. Her calf is soft. She has full extension to about 95 of flexion.  I showed her quad strengthening exercises on want her to try to do and give her any time. I did offer an aspiration but she declined this. We'll see her back in a month to see how she doing overall. All questions and concerns were answered and addressed.

## 2017-03-19 ENCOUNTER — Encounter: Payer: Self-pay | Admitting: Family Medicine

## 2017-03-19 ENCOUNTER — Ambulatory Visit (INDEPENDENT_AMBULATORY_CARE_PROVIDER_SITE_OTHER): Payer: 59 | Admitting: Family Medicine

## 2017-03-19 VITALS — BP 120/80 | HR 71 | Temp 98.1°F | Resp 16 | Ht 69.0 in | Wt 164.0 lb

## 2017-03-19 DIAGNOSIS — Z23 Encounter for immunization: Secondary | ICD-10-CM

## 2017-03-19 DIAGNOSIS — Z1159 Encounter for screening for other viral diseases: Secondary | ICD-10-CM

## 2017-03-19 DIAGNOSIS — Z9189 Other specified personal risk factors, not elsewhere classified: Secondary | ICD-10-CM

## 2017-03-19 DIAGNOSIS — Z0000 Encounter for general adult medical examination without abnormal findings: Secondary | ICD-10-CM

## 2017-03-19 DIAGNOSIS — E785 Hyperlipidemia, unspecified: Secondary | ICD-10-CM | POA: Diagnosis not present

## 2017-03-19 DIAGNOSIS — E559 Vitamin D deficiency, unspecified: Secondary | ICD-10-CM

## 2017-03-19 LAB — BASIC METABOLIC PANEL
BUN: 16 mg/dL (ref 6–23)
CHLORIDE: 100 meq/L (ref 96–112)
CO2: 32 mEq/L (ref 19–32)
CREATININE: 0.72 mg/dL (ref 0.40–1.20)
Calcium: 9.9 mg/dL (ref 8.4–10.5)
GFR: 87.3 mL/min (ref >60.00)
Glucose, Bld: 108 mg/dL — ABNORMAL HIGH (ref 70–99)
Potassium: 4.6 mEq/L (ref 3.5–5.1)
Sodium: 139 mEq/L (ref 135–145)

## 2017-03-19 LAB — LIPID PANEL
CHOL/HDL RATIO: 3
CHOLESTEROL: 246 mg/dL — AB (ref 0–200)
HDL: 76.7 mg/dL (ref >39.00)
LDL Cholesterol: 149 mg/dL — ABNORMAL HIGH (ref 0–99)
NONHDL: 169.27
TRIGLYCERIDES: 99 mg/dL (ref 0.0–149.0)
VLDL: 19.8 mg/dL (ref 0.0–40.0)

## 2017-03-19 LAB — CBC WITH DIFFERENTIAL/PLATELET
BASOS PCT: 0.8 % (ref 0.0–3.0)
Basophils Absolute: 0 10*3/uL (ref 0.0–0.1)
EOS PCT: 2.1 % (ref 0.0–5.0)
Eosinophils Absolute: 0.1 10*3/uL (ref 0.0–0.7)
HEMATOCRIT: 41.3 % (ref 36.0–46.0)
Hemoglobin: 13.9 g/dL (ref 12.0–15.0)
LYMPHS PCT: 31 % (ref 12.0–46.0)
Lymphs Abs: 1.8 10*3/uL (ref 0.7–4.0)
MCHC: 33.6 g/dL (ref 30.0–36.0)
MCV: 96 fl (ref 78.0–100.0)
MONO ABS: 0.6 10*3/uL (ref 0.1–1.0)
MONOS PCT: 10.9 % (ref 3.0–12.0)
Neutro Abs: 3.2 10*3/uL (ref 1.4–7.7)
Neutrophils Relative %: 55.2 % (ref 43.0–77.0)
Platelets: 235 10*3/uL (ref 150.0–400.0)
RBC: 4.3 Mil/uL (ref 3.87–5.11)
RDW: 13.5 % (ref 11.5–15.5)
WBC: 5.7 10*3/uL (ref 4.0–10.5)

## 2017-03-19 LAB — HEPATIC FUNCTION PANEL
ALT: 13 U/L (ref 0–35)
AST: 16 U/L (ref 0–37)
Albumin: 4.4 g/dL (ref 3.5–5.2)
Alkaline Phosphatase: 54 U/L (ref 39–117)
BILIRUBIN TOTAL: 0.8 mg/dL (ref 0.2–1.2)
Bilirubin, Direct: 0.1 mg/dL (ref 0.0–0.3)
Total Protein: 6.8 g/dL (ref 6.0–8.3)

## 2017-03-19 LAB — VITAMIN D 25 HYDROXY (VIT D DEFICIENCY, FRACTURES): VITD: 23.05 ng/mL — ABNORMAL LOW (ref 30.00–100.00)

## 2017-03-19 LAB — TSH: TSH: 1.47 u[IU]/mL (ref 0.35–4.50)

## 2017-03-19 NOTE — Assessment & Plan Note (Signed)
Pt's PE WNL.  UTD on colonoscopy, mammo.  Flu shot given.  Check labs.  Anticipatory guidance provided.

## 2017-03-19 NOTE — Progress Notes (Signed)
   Subjective:    Patient ID: [name] [name], female    DOB: 1955-07-23, 61 y.o.   MRN: [id number]  HPI CPE- UTD on colonoscopy, mammo.  No need for pap due to hysterectomy.  Pt has lost 7 lbs since last visit (22 lbs overall) with healthy diet and regular exercise (very low carb diet)  Pt to get flu shot today   Review of Systems Patient reports no vision/ hearing changes, adenopathy,fever, weight change,  persistant/recurrent hoarseness , swallowing issues, chest pain, palpitations, edema, persistant/recurrent cough, hemoptysis, dyspnea (rest/exertional/paroxysmal nocturnal), gastrointestinal bleeding (melena, rectal bleeding), abdominal pain, significant heartburn, bowel changes, GU symptoms (dysuria, hematuria, incontinence), Gyn symptoms (abnormal  bleeding, pain),  syncope, focal weakness, memory loss, numbness & tingling, skin/hair/nail changes, abnormal bruising or bleeding, anxiety, or depression.     Objective:   Physical Exam General Appearance:    Alert, cooperative, no distress, appears stated age  Head:    Normocephalic, without obvious abnormality, atraumatic  Eyes:    PERRL, conjunctiva/corneas clear, EOM's intact, fundi    benign, both eyes  Ears:    Normal TM's and external ear canals, both ears  Nose:   Nares normal, septum midline, mucosa normal, no drainage    or sinus tenderness  Throat:   Lips, mucosa, and tongue normal; teeth and gums normal  Neck:   Supple, symmetrical, trachea midline, no adenopathy;    Thyroid: no enlargement/tenderness/nodules  Back:     Symmetric, no curvature, ROM normal, no CVA tenderness  Lungs:     Clear to auscultation bilaterally, respirations unlabored  Chest Wall:    No tenderness or deformity   Heart:    Regular rate and rhythm, S1 and S2 normal, no murmur, rub   or gallop  Breast Exam:    Deferred to mammo  Abdomen:     Soft, non-tender, bowel sounds active all four quadrants,    no masses, no organomegaly  Genitalia:    Deferred    Rectal:    Extremities:   Extremities normal, atraumatic, no cyanosis or edema  Pulses:   2+ and symmetric all extremities  Skin:   Skin color, texture, turgor normal, no rashes or lesions  Lymph nodes:   Cervical, supraclavicular, and axillary nodes normal  Neurologic:   CNII-XII intact, normal strength, sensation and reflexes    throughout          Assessment & Plan:

## 2017-03-19 NOTE — Assessment & Plan Note (Signed)
Check labs.  Replete prn. 

## 2017-03-19 NOTE — Patient Instructions (Signed)
Follow up in 1 year or as needed We'll notify you of your lab results and make any changes if needed Keep up the good work on healthy diet and regular exercise- you look great! Call with any questions or concerns Happy Fall!!! 

## 2017-03-20 ENCOUNTER — Other Ambulatory Visit: Payer: Self-pay | Admitting: General Practice

## 2017-03-20 LAB — HIV ANTIBODY (ROUTINE TESTING W REFLEX): HIV 1&2 Ab, 4th Generation: NONREACTIVE

## 2017-03-20 LAB — HEPATITIS C ANTIBODY
HEP C AB: NONREACTIVE
SIGNAL TO CUT-OFF: 0.03 (ref <1.00)

## 2017-03-20 MED ORDER — VITAMIN D (ERGOCALCIFEROL) 1.25 MG (50000 UNIT) PO CAPS: 50000.0000 [IU] | ORAL_CAPSULE | ORAL | 0 refills | Status: DC

## 2017-04-08 ENCOUNTER — Encounter (INDEPENDENT_AMBULATORY_CARE_PROVIDER_SITE_OTHER): Payer: Self-pay | Admitting: Orthopaedic Surgery

## 2017-04-08 ENCOUNTER — Ambulatory Visit (INDEPENDENT_AMBULATORY_CARE_PROVIDER_SITE_OTHER): Payer: 59 | Admitting: Orthopaedic Surgery

## 2017-04-08 DIAGNOSIS — Z9889 Other specified postprocedural states: Secondary | ICD-10-CM | POA: Diagnosis not present

## 2017-04-08 MED ORDER — LIDOCAINE HCL 1 % IJ SOLN
3.0000 mL | INTRAMUSCULAR | Status: AC | PRN
Start: 2017-04-08 — End: 2017-04-08
  Administered 2017-04-08: 3 mL

## 2017-04-08 MED ORDER — METHYLPREDNISOLONE ACETATE 40 MG/ML IJ SUSP
40.0000 mg | INTRAMUSCULAR | Status: AC | PRN
  Administered 2017-04-08: 40 mg via INTRA_ARTICULAR

## 2017-04-08 NOTE — Progress Notes (Signed)
Office Visit Note   Patient: [name] [name]           Date of Birth: March 13, 1956           MRN: [id number] Visit Date: 04/08/2017              Requested by: Midge Minium, MD [address] [place], [place] [address] PCP: Midge Minium, MD   Assessment & Plan: Visit Diagnoses:  1. Status post arthroscopy of left knee     Plan: I gave her reassurance and certainly time will help improve the pain in her knee.  Also talked about her trying a steroid injection to help with postoperative pain and inflammation and she is agreeable to this.  I explained the risk and benefits of injection and the rationale behind this.  She tolerated the injection well.  We will see her back in a month see how she is doing overall.  Follow-Up Instructions: Return in about 4 weeks (around 05/06/2017).   Orders:  No orders of the defined types were placed in this encounter.  No orders of the defined types were placed in this encounter.     Procedures: Large Joint Inj: L knee on 04/08/2017 9:22 AM Indications: diagnostic evaluation and pain Details: 22 G 1.5 in needle, superolateral approach  Arthrogram: No  Medications: 3 mL lidocaine 1 %; 40 mg methylPREDNISolone acetate 40 MG/ML Outcome: tolerated well, no immediate complications Procedure, treatment alternatives, risks and benefits explained, specific risks discussed. Consent was given by the patient. Immediately prior to procedure a time out was called to verify the correct patient, procedure, equipment, support staff and site/side marked as required. Patient was prepped and draped in the usual sterile fashion.       Clinical Data: No additional findings.   Subjective: Chief Complaint  Patient presents with  . Left Knee - Routine Post Op  The patient is now about 6 weeks status post a left knee arthroscopy with partial lateral meniscectomy.  She still has some stiffness and pain in the knee but her range of motion is  improving.  We talked about the possibility of physical therapy but she still like to try time in a home exercise program as well.  She says is worse when she is first sitting for long period time to get up and take some steps.  HPI  Review of Systems She denies any fever, chills, nausea, vomiting  Objective: Vital Signs: There were no vitals taken for this visit.  Physical Exam She is alert and oriented x3 in no acute distress Ortho Exam Examination of her left knee she is only a mild effusion which is much improved from her last visit.  She has lateral joint line tenderness to be expected.  Her range of motion is almost full. Specialty Comments:  No specialty comments available.  Imaging: No results found.   PMFS History: Patient Active Problem List   Diagnosis Date Noted  . Vitamin D deficiency 03/19/2017  . Status post arthroscopy of left knee 03/07/2017  . OSA (obstructive sleep apnea) 04/17/2016  . Polyarthralgia 12/29/2015  . Physical exam 11/02/2015  . Primary cancer of upper outer quadrant of right breast ([place]) 10/22/2014  . Palpitations 12/25/2012  . History: SVT (supraventricular tachycardia), status post RFA ablation 12/25/2012   Past Medical History:  Diagnosis Date  . Cancer [facility]) 2016   right breast cancer  . Cancer of right breast [facility])    last radiation was 01/2015  .  Dysrhythmia    no problems since ablation 2004  . Lipoma of abdominal wall   . OSA (obstructive sleep apnea) 04/17/2016  . Paroxysmal SVT (supraventricular tachycardia) ([place]) 2004   Status post RFA ABLATION  . S/P radiation therapy 12/28/2014 through 01/27/2015   Right breast 4250 cGy 17 sessions, right breast boost 1200 cGy in 6 sessions     Family History  Problem Relation Age of Onset  . CVA Mother   . Bladder Cancer Father   . CVA Maternal Grandmother        x2  . Heart attack Maternal Grandfather   .  Leukemia Paternal Grandfather   . Acute lymphoblastic leukemia Paternal Grandfather   . Supraventricular tachycardia Sister   . Supraventricular tachycardia Brother   . Supraventricular tachycardia Son        passed at 94  . Supraventricular tachycardia Grandchild        4 grandchildren  . Ovarian cancer Paternal Aunt   . Lung cancer Paternal Uncle   . Lung cancer Paternal Uncle   . Lung cancer Paternal Aunt     Past Surgical History:  Procedure Laterality Date  . ABDOMINAL HYSTERECTOMY  1999   WITH BLADDER REPAIR FOR PROPLAPSE-HAS OVARIES (NO CA)  . COLONOSCOPY    . KNEE CARTILAGE SURGERY    . STAPEDECTOMY Right   . SVT ablation  2000   Social History   Occupational History  . Not on file  Tobacco Use  . Smoking status: Former Smoker    Packs/day: 2.00    Years: 6.00    Pack years: 12.00    Last attempt to quit: 12/16/1977    Years since quitting: 39.3  . Smokeless tobacco: Never Used  Substance and Sexual Activity  . Alcohol use: Yes    Alcohol/week: 2.4 - 3.0 oz    Types: 4 - 5 Glasses of wine per week    Comment: red wine daily  . Drug use: No  . Sexual activity: Yes    Birth control/protection: Surgical

## 2017-04-12 ENCOUNTER — Telehealth: Payer: Self-pay | Admitting: Hematology and Oncology

## 2017-04-12 NOTE — Telephone Encounter (Signed)
Patient called in to reschedule her appointment °

## 2017-05-06 ENCOUNTER — Ambulatory Visit (INDEPENDENT_AMBULATORY_CARE_PROVIDER_SITE_OTHER): Payer: 59 | Admitting: Orthopaedic Surgery

## 2017-05-15 ENCOUNTER — Encounter (INDEPENDENT_AMBULATORY_CARE_PROVIDER_SITE_OTHER): Payer: Self-pay | Admitting: Orthopaedic Surgery

## 2017-05-15 ENCOUNTER — Ambulatory Visit (INDEPENDENT_AMBULATORY_CARE_PROVIDER_SITE_OTHER): Payer: 59 | Admitting: Orthopaedic Surgery

## 2017-05-15 DIAGNOSIS — G8929 Other chronic pain: Secondary | ICD-10-CM

## 2017-05-15 DIAGNOSIS — M25562 Pain in left knee: Secondary | ICD-10-CM | POA: Diagnosis not present

## 2017-05-15 MED ORDER — LIDOCAINE HCL 1 % IJ SOLN
3.0000 mL | INTRAMUSCULAR | Status: AC | PRN
Start: 2017-05-15 — End: 2017-05-15
  Administered 2017-05-15: 3 mL

## 2017-05-15 MED ORDER — METHYLPREDNISOLONE ACETATE 40 MG/ML IJ SUSP
40.0000 mg | INTRAMUSCULAR | Status: AC | PRN
  Administered 2017-05-15: 40 mg via INTRA_ARTICULAR

## 2017-05-15 NOTE — Progress Notes (Signed)
Office Visit Note   Patient: [name] [name]           Date of Birth: Apr 18, 1956           MRN: [id number] Visit Date: 05/15/2017              Requested by: Midge Minium, MD [address] [place], [place] [address] PCP: Midge Minium, MD   Assessment & Plan: Visit Diagnoses:  1. Chronic pain of left knee     Plan: I do feel that this is still mainly an IT band syndrome and recommend an injection around the fibular head which she tolerated well and this made her pain-free.  I would like to send her to formal physical therapy as well and we talked about the rationale behind this.  She agreed to this as well.  I gave her a prescription for physical therapy on her left knee.  I will see her back in a month to see how she is doing overall.  Follow-Up Instructions: Return in about 4 weeks (around 06/12/2017).   Orders:  No orders of the defined types were placed in this encounter.  No orders of the defined types were placed in this encounter.     Procedures: Large Joint Inj: L knee on 05/15/2017 10:56 AM Indications: diagnostic evaluation and pain Details: 22 G 1.5 in needle, superolateral approach  Arthrogram: No  Medications: 3 mL lidocaine 1 %; 40 mg methylPREDNISolone acetate 40 MG/ML Outcome: tolerated well, no immediate complications Procedure, treatment alternatives, risks and benefits explained, specific risks discussed. Consent was given by the patient. Immediately prior to procedure a time out was called to verify the correct patient, procedure, equipment, support staff and site/side marked as required. Patient was prepped and draped in the usual sterile fashion.       Clinical Data: No additional findings.   Subjective: No chief complaint on file. Patient comes in today for evaluation of left knee pain.  Her pain is mainly over the iliotibial band at the lateral aspect of the knee and the fibular head.  Is becoming problematic in terms of affecting  her while she is swimming and doing other activities.  We had an MRI that knee that showed a large knee joint effusion and synovitis in the knee but no significant tearing of structures.  I went over this MRI again with her today so we can at least look at the fibular head and the structures around this to see if there is any issues.  HPI  Review of Systems She currently denies any headache, chest pain, shortness of breath, fever, chills, nausea, vomiting.  Objective: Vital Signs: There were no vitals taken for this visit.  Physical Exam She is alert and oriented x3 and in no acute distress Ortho Exam Examination of her left knee shows no effusion is nice cool knee joint.  Range of motion is full.  She has significant tenderness over the fibular head and the distal iliotibial band. Specialty Comments:  No specialty comments available.  Imaging: No results found.   PMFS History: Patient Active Problem List   Diagnosis Date Noted  . Vitamin D deficiency 03/19/2017  . Status post arthroscopy of left knee 03/07/2017  . OSA (obstructive sleep apnea) 04/17/2016  . Polyarthralgia 12/29/2015  . Physical exam 11/02/2015  . Primary cancer of upper outer quadrant of right breast ([place]) 10/22/2014  . Palpitations 12/25/2012  . History: SVT (supraventricular tachycardia), status post RFA ablation 12/25/2012  Past Medical History:  Diagnosis Date  . Cancer [facility]) 2016   right breast cancer  . Cancer of right breast [facility])    last radiation was 01/2015  . Dysrhythmia    no problems since ablation 2004  . Lipoma of abdominal wall   . OSA (obstructive sleep apnea) 04/17/2016  . Paroxysmal SVT (supraventricular tachycardia) ([place]) 2004   Status post RFA ABLATION  . S/P radiation therapy 12/28/2014 through 01/27/2015   Right breast 4250 cGy 17 sessions, right breast boost 1200 cGy in 6 sessions     Family History    Problem Relation Age of Onset  . CVA Mother   . Bladder Cancer Father   . CVA Maternal Grandmother        x2  . Heart attack Maternal Grandfather   . Leukemia Paternal Grandfather   . Acute lymphoblastic leukemia Paternal Grandfather   . Supraventricular tachycardia Sister   . Supraventricular tachycardia Brother   . Supraventricular tachycardia Son        passed at 88  . Supraventricular tachycardia Grandchild        4 grandchildren  . Ovarian cancer Paternal Aunt   . Lung cancer Paternal Uncle   . Lung cancer Paternal Uncle   . Lung cancer Paternal Aunt     Past Surgical History:  Procedure Laterality Date  . ABDOMINAL HYSTERECTOMY  1999   WITH BLADDER REPAIR FOR PROPLAPSE-HAS OVARIES (NO CA)  . BREAST LUMPECTOMY WITH RADIOACTIVE SEED AND SENTINEL LYMPH NODE BIOPSY Right 11/02/2014   Procedure: RIGHT BREAST LUMPECTOMY WITH RADIOACTIVE SEED AND RIGHT AXILLARY SENTINEL LYMPH NODE BIOPSY;  Surgeon: Excell Seltzer, MD;  Location: [place];  Service: General;  Laterality: Right;  . COLONOSCOPY    . KNEE CARTILAGE SURGERY    . LIPOMA EXCISION N/A 03/23/2015   Procedure: EXCISION LIPOMA ABDOMINAL WALLL;  Surgeon: Excell Seltzer, MD;  Location: [employer];  Service: General;  Laterality: N/A;  . STAPEDECTOMY Right   . SVT ablation  2000   Social History   Occupational History  . Not on file  Tobacco Use  . Smoking status: Former Smoker    Packs/day: 2.00    Years: 6.00    Pack years: 12.00    Last attempt to quit: 12/16/1977    Years since quitting: 39.4  . Smokeless tobacco: Never Used  Substance and Sexual Activity  . Alcohol use: Yes    Alcohol/week: 2.4 - 3.0 oz    Types: 4 - 5 Glasses of wine per week    Comment: red wine daily  . Drug use: No  . Sexual activity: Yes    Birth control/protection: Surgical

## 2017-05-27 ENCOUNTER — Telehealth: Payer: Self-pay | Admitting: Emergency Medicine

## 2017-05-27 NOTE — Telephone Encounter (Signed)
Pt would need to check with her insurance to make sure that the Shingrix is covered, but if it is, I'm fine with her scheduling a nurse visit for the shingles shot

## 2017-05-27 NOTE — Telephone Encounter (Signed)
Spoke with pt she advised that it was covered by insurance. appt made for  05/31/17.

## 2017-05-27 NOTE — Telephone Encounter (Signed)
Please advise 

## 2017-05-27 NOTE — Telephone Encounter (Signed)
In reviewing chart. No Shingles vaccine noted in chart.  Copied from [place] [id number]. Topic: Appointment Scheduling - Scheduling Inquiry for Clinic >> May 27, 2017  9:54 AM Conception Chancy, NT wrote: Reason for CRM: patient is wanting to schedule a shingle shot, is this ok? Please advise and contact pt.

## 2017-05-31 ENCOUNTER — Ambulatory Visit (INDEPENDENT_AMBULATORY_CARE_PROVIDER_SITE_OTHER): Payer: 59 | Admitting: *Deleted

## 2017-05-31 DIAGNOSIS — Z23 Encounter for immunization: Secondary | ICD-10-CM | POA: Diagnosis not present

## 2017-05-31 NOTE — Progress Notes (Signed)
Patient in for 1 injection of Shingrix.   Tolerated well.   Will call to schedule 2nd dose.

## 2017-06-12 ENCOUNTER — Ambulatory Visit (INDEPENDENT_AMBULATORY_CARE_PROVIDER_SITE_OTHER): Payer: 59 | Admitting: Orthopaedic Surgery

## 2017-06-26 ENCOUNTER — Other Ambulatory Visit: Payer: Self-pay | Admitting: Hematology and Oncology

## 2017-06-26 ENCOUNTER — Ambulatory Visit (INDEPENDENT_AMBULATORY_CARE_PROVIDER_SITE_OTHER): Payer: 59 | Admitting: Orthopaedic Surgery

## 2017-06-26 ENCOUNTER — Ambulatory Visit (INDEPENDENT_AMBULATORY_CARE_PROVIDER_SITE_OTHER): Payer: 59 | Admitting: Physician Assistant

## 2017-06-26 DIAGNOSIS — C50411 Malignant neoplasm of upper-outer quadrant of right female breast: Secondary | ICD-10-CM

## 2017-07-01 ENCOUNTER — Telehealth: Payer: Self-pay | Admitting: Hematology and Oncology

## 2017-07-01 ENCOUNTER — Inpatient Hospital Stay: Payer: 59 | Attending: Hematology and Oncology | Admitting: Hematology and Oncology

## 2017-07-01 DIAGNOSIS — C50411 Malignant neoplasm of upper-outer quadrant of right female breast: Secondary | ICD-10-CM | POA: Insufficient documentation

## 2017-07-01 NOTE — Assessment & Plan Note (Signed)
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 Hot flashes Have resolved since she started taking half a tablet twice a day 2. Slightly more moody than usual Patient was in [place] for the last 6 weeks and just returned back.  Breast Cancer Surveillance: 1. Breast exam 07/01/2017 tenderness in the right breast: Normal 2. Mammogram 10/17/2016 at [facility] no abnormalities. Postsurgical changes.  Breast density category C Return to clinic in 1 year

## 2017-07-01 NOTE — Telephone Encounter (Signed)
Gave avs and calendar for February 2020 °

## 2017-07-01 NOTE — Progress Notes (Signed)
Patient Care Team: Midge Minium, MD as PCP - General (Family Medicine) Nicholas Lose, MD as Consulting Physician (Hematology and Oncology)  DIAGNOSIS:  Encounter Diagnosis  Name Primary?  . Primary cancer of upper outer quadrant of right breast ([place])     SUMMARY OF ONCOLOGIC HISTORY:   Primary cancer of upper outer quadrant of right breast ([place])   09/30/2014 Initial Diagnosis    Right breast invasive ductal carcinoma grade 1, Ki-67 5%, ER positive, PR positive, HER-2 negative, 1.4 cm mass at 11:30 position no lymph nodes by ultrasound T1 cN0 M0 stage IA clinical stage      11/02/2014 Surgery    Right lumpectomy: Invasive ductal carcinoma with extracellular mucin, grade 2/3, 1.8 cm, 0/2 lymph nodes negative, ER 98%, PR 63%, HER-2 negative ratio 1.58, Ki-67 7%, Oncotype DX score 12, 8% risk of recurrence, T1 cN0 stage IA      12/28/2014 - 01/27/2015 Radiation Therapy    Adjuvant radiation therapy with boost      03/01/2015 -  Anti-estrogen oral therapy    Anastrozole 1 mg daily       CHIEF COMPLIANT: Follow-up on anastrozole therapy  INTERVAL HISTORY: [name] [name] is a 62 year old with above-mentioned history of right breast cancer treated with lumpectomy radiation is currently on anastrozole therapy.  She is tolerating it fairly well.  She has been on anastrozole for the past 2 and half years.  She does not have any arthralgias or myalgias.  She had mild hot flashes which have improved since she started taking half a tablet twice a day.  She denies any pain or discomfort in the breast.  REVIEW OF SYSTEMS:   Constitutional: Denies fevers, chills or abnormal weight loss Eyes: Denies blurriness of vision Ears, nose, mouth, throat, and face: Denies mucositis or sore throat Respiratory: Denies cough, dyspnea or wheezes Cardiovascular: Denies palpitation, chest discomfort Gastrointestinal:  Denies nausea, heartburn or change in bowel habits Skin: Denies abnormal skin  rashes Lymphatics: Denies new lymphadenopathy or easy bruising Neurological:Denies numbness, tingling or new weaknesses Behavioral/Psych: Mood is stable, no new changes  Extremities: No lower extremity edema Breast:  denies any pain or lumps or nodules in either breasts All other systems were reviewed with the patient and are negative.  I have reviewed the past medical history, past surgical history, social history and family history with the patient and they are unchanged from previous note.  ALLERGIES:  has No Known Allergies.  MEDICATIONS:  Current Outpatient Medications  Medication Sig Dispense Refill  . anastrozole (ARIMIDEX) 1 MG tablet TAKE ONE TABLET BY MOUTH ONCE DAILY 90 tablet 3  . Cholecalciferol (VITAMIN D3) 2000 units TABS Take 2,000 Units by mouth daily.    . Multiple Vitamin (MULTIVITAMIN) tablet Take 1 tablet by mouth daily.    Marland Kitchen UNABLE TO FIND Dispense compression sleeve 20-30 mm/Hg for this pt with history of breast cancer s/p surgery. 1 each 0  . Vitamin D, Ergocalciferol, (DRISDOL) 50000 units CAPS capsule Take 1 capsule (50,000 Units total) by mouth every 7 (seven) days. 12 capsule 0  . vitamin E 1000 UNIT capsule Take 1,000 Units by mouth daily.     No current facility-administered medications for this visit.     PHYSICAL EXAMINATION: ECOG PERFORMANCE STATUS: 1 - Symptomatic but completely ambulatory  Vitals:   07/01/17 0928  BP: 126/86  Pulse: 73  Resp: 18  Temp: 98.3 F (36.8 C)  SpO2: 100%   Filed Weights   07/01/17 0928  Weight:  170 lb 14.4 oz (77.5 kg)    GENERAL:alert, no distress and comfortable SKIN: skin color, texture, turgor are normal, no rashes or significant lesions EYES: normal, Conjunctiva are pink and non-injected, sclera clear OROPHARYNX:no exudate, no erythema and lips, buccal mucosa, and tongue normal  NECK: supple, thyroid normal size, non-tender, without nodularity LYMPH:  no palpable lymphadenopathy in the cervical, axillary  or inguinal LUNGS: clear to auscultation and percussion with normal breathing effort HEART: regular rate & rhythm and no murmurs and no lower extremity edema ABDOMEN:abdomen soft, non-tender and normal bowel sounds MUSCULOSKELETAL:no cyanosis of digits and no clubbing  NEURO: alert & oriented x 3 with fluent speech, no focal motor/sensory deficits EXTREMITIES: No lower extremity edema BREAST: No palpable masses or nodules in either right or left breasts. No palpable axillary supraclavicular or infraclavicular adenopathy no breast tenderness or nipple discharge. (exam performed in the presence of a chaperone)  LABORATORY DATA:  I have reviewed the data as listed CMP Latest Ref Rng & Units 03/19/2017 11/02/2015  Glucose 70 - 99 mg/dL 108(H) 104(H)  BUN 6 - 23 mg/dL 16 19  Creatinine 0.40 - 1.20 mg/dL 0.72 0.68  Sodium 135 - 145 mEq/L 139 135  Potassium 3.5 - 5.1 mEq/L 4.6 4.7  Chloride 96 - 112 mEq/L 100 100  CO2 19 - 32 mEq/L 32 30  Calcium 8.4 - 10.5 mg/dL 9.9 9.6  Total Protein 6.0 - 8.3 g/dL 6.8 6.8  Total Bilirubin 0.2 - 1.2 mg/dL 0.8 0.8  Alkaline Phos 39 - 117 U/L 54 50  AST 0 - 37 U/L 16 15  ALT 0 - 35 U/L 13 12    Lab Results  Component Value Date   WBC 5.7 03/19/2017   HGB 13.9 03/19/2017   HCT 41.3 03/19/2017   MCV 96.0 03/19/2017   PLT 235.0 03/19/2017   NEUTROABS 3.2 03/19/2017    ASSESSMENT & PLAN:  Primary cancer of upper outer quadrant of right breast Right lumpectomy 11/02/2014: Invasive ductal carcinoma with extracellular mucin, grade 2/3, 1.8 cm, 0/2 lymph nodes negative, ER 98%, PR 63%, HER-2 negative ratio 1.58, Ki-67 7%, Oncotype DX score 12, 8% risk of recurrence, T1 cN0 stage IA status post radiation completed 01/27/2015  Current treatment: Anastrozole adjuvant therapy 1 mg daily 5 years started October 2016 Anastrozole toxicities:  1. Hot flashes Have resolved since she started taking half a tablet twice a day 2. Slightly more moody than  usual  Patient was in [place] for the last 6 weeks and just returned back.  Breast Cancer Surveillance: 1. Breast exam 07/01/2017 tenderness in the right breast: Normal 2. Mammogram 10/17/2016 at [facility] no abnormalities. Postsurgical changes.  Breast density category C Return to clinic in 1 year  I spent 25 minutes talking to the patient of which more than half was spent in counseling and coordination of care.  No orders of the defined types were placed in this encounter.  The patient has a good understanding of the overall plan. she agrees with it. she will call with any problems that may develop before the next visit here.   Harriette Ohara, MD 07/01/17

## 2017-07-26 ENCOUNTER — Ambulatory Visit: Payer: 59 | Admitting: Hematology and Oncology

## 2017-09-18 ENCOUNTER — Other Ambulatory Visit (INDEPENDENT_AMBULATORY_CARE_PROVIDER_SITE_OTHER): Payer: 59

## 2017-09-18 DIAGNOSIS — Z23 Encounter for immunization: Secondary | ICD-10-CM | POA: Diagnosis not present

## 2017-10-17 LAB — HM MAMMOGRAPHY

## 2017-10-18 ENCOUNTER — Encounter: Payer: Self-pay | Admitting: General Practice

## 2017-11-25 ENCOUNTER — Encounter (INDEPENDENT_AMBULATORY_CARE_PROVIDER_SITE_OTHER): Payer: Self-pay | Admitting: Orthopaedic Surgery

## 2017-11-25 ENCOUNTER — Ambulatory Visit (INDEPENDENT_AMBULATORY_CARE_PROVIDER_SITE_OTHER): Payer: 59 | Admitting: Orthopaedic Surgery

## 2017-11-25 DIAGNOSIS — M25562 Pain in left knee: Secondary | ICD-10-CM | POA: Diagnosis not present

## 2017-11-25 DIAGNOSIS — G8929 Other chronic pain: Secondary | ICD-10-CM

## 2017-11-25 NOTE — Progress Notes (Signed)
Office Visit Note   Patient: [name] [name]           Date of Birth: 1955-07-29           MRN: [id number] Visit Date: 11/25/2017              Requested by: Midge Minium, MD [address] [place], [place] [address] PCP: Midge Minium, MD   Assessment & Plan: Visit Diagnoses:  1. Chronic pain of left knee     Plan: I do feel this is more of an IT band syndrome of her left knee.  I explained the anatomy and talked her about IT band syndrome.  I have encouraged her to take 2 Aleve twice a day for the next few days to help her symptoms calm down.  If this continues to bother her we can try a steroid injection in this area.  We also talked about stretching exercises.  All questions concerns were answered and addressed.  Follow-up will be as needed.  Follow-Up Instructions: Return if symptoms worsen or fail to improve.   Orders:  No orders of the defined types were placed in this encounter.  No orders of the defined types were placed in this encounter.     Procedures: No procedures performed   Clinical Data: No additional findings.   Subjective: Chief Complaint  Patient presents with  . Left Knee - Pain  The patient comes in today with acute flareup of her left knee pain.  Patient perform her arthroscopic surgery in October of last year on her knee and found only a small meniscal root tear with intact cartilage throughout the knee.  She is very active 62 year old is been doing a lot of line dancing and playing pickle ball.  She points the lateral aspect of her knee as source of her pain.  She denies any instability but does throb quite a bit times.  Aleve has helped but she only takes one hearing ear.  Her knee is not hurting her today of course.  HPI  Review of Systems She currently denies any headache, chest pain, shortness of breath, fever, chills, nausea, vomiting.  Objective: Vital Signs: There were no vitals taken for this visit.  Physical Exam She  is alert and oriented x3 and in no acute distress  Ortho Exam Examination of her left knee today shows no effusion.  She has full range of motion of the left knee and is ligamentously stable.  Her pain is only over the lateral aspect of the knee at the IT band.  Specialty Comments:  No specialty comments available.  Imaging: No results found.   PMFS History: Patient Active Problem List   Diagnosis Date Noted  . Vitamin D deficiency 03/19/2017  . Status post arthroscopy of left knee 03/07/2017  . OSA (obstructive sleep apnea) 04/17/2016  . Polyarthralgia 12/29/2015  . Physical exam 11/02/2015  . Primary cancer of upper outer quadrant of right breast ([place]) 10/22/2014  . Palpitations 12/25/2012  . History: SVT (supraventricular tachycardia), status post RFA ablation 12/25/2012   Past Medical History:  Diagnosis Date  . Cancer [facility]) 2016   right breast cancer  . Cancer of right breast [facility])    last radiation was 01/2015  . Dysrhythmia    no problems since ablation 2004  . Lipoma of abdominal wall   . OSA (obstructive sleep apnea) 04/17/2016  . Paroxysmal SVT (supraventricular tachycardia) ([place]) 2004   Status post RFA ABLATION  .  S/P radiation therapy 12/28/2014 through 01/27/2015   Right breast 4250 cGy 17 sessions, right breast boost 1200 cGy in 6 sessions     Family History  Problem Relation Age of Onset  . CVA Mother   . Bladder Cancer Father   . CVA Maternal Grandmother        x2  . Heart attack Maternal Grandfather   . Leukemia Paternal Grandfather   . Acute lymphoblastic leukemia Paternal Grandfather   . Supraventricular tachycardia Sister   . Supraventricular tachycardia Brother   . Supraventricular tachycardia Son        passed at 90  . Supraventricular tachycardia Grandchild        4 grandchildren  . Ovarian cancer Paternal Aunt   . Lung cancer Paternal Uncle   . Lung cancer Paternal  Uncle   . Lung cancer Paternal Aunt     Past Surgical History:  Procedure Laterality Date  . ABDOMINAL HYSTERECTOMY  1999   WITH BLADDER REPAIR FOR PROPLAPSE-HAS OVARIES (NO CA)  . BREAST LUMPECTOMY WITH RADIOACTIVE SEED AND SENTINEL LYMPH NODE BIOPSY Right 11/02/2014   Procedure: RIGHT BREAST LUMPECTOMY WITH RADIOACTIVE SEED AND RIGHT AXILLARY SENTINEL LYMPH NODE BIOPSY;  Surgeon: Excell Seltzer, MD;  Location: [place];  Service: General;  Laterality: Right;  . COLONOSCOPY    . KNEE CARTILAGE SURGERY    . LIPOMA EXCISION N/A 03/23/2015   Procedure: EXCISION LIPOMA ABDOMINAL WALLL;  Surgeon: Excell Seltzer, MD;  Location: [place];  Service: General;  Laterality: N/A;  . STAPEDECTOMY Right   . SVT ablation  2000   Social History   Occupational History  . Not on file  Tobacco Use  . Smoking status: Former Smoker    Packs/day: 2.00    Years: 6.00    Pack years: 12.00    Last attempt to quit: 12/16/1977    Years since quitting: 39.9  . Smokeless tobacco: Never Used  Substance and Sexual Activity  . Alcohol use: Yes    Alcohol/week: 2.4 - 3.0 oz    Types: 4 - 5 Glasses of wine per week    Comment: red wine daily  . Drug use: No  . Sexual activity: Yes    Birth control/protection: Surgical

## 2018-03-25 ENCOUNTER — Encounter: Payer: Self-pay | Admitting: Family Medicine

## 2018-03-25 ENCOUNTER — Other Ambulatory Visit: Payer: Self-pay

## 2018-03-25 ENCOUNTER — Ambulatory Visit (INDEPENDENT_AMBULATORY_CARE_PROVIDER_SITE_OTHER): Payer: 59 | Admitting: Family Medicine

## 2018-03-25 VITALS — BP 121/83 | HR 76 | Temp 98.3°F | Resp 16 | Ht 69.0 in | Wt 169.4 lb

## 2018-03-25 DIAGNOSIS — E559 Vitamin D deficiency, unspecified: Secondary | ICD-10-CM | POA: Diagnosis not present

## 2018-03-25 DIAGNOSIS — E785 Hyperlipidemia, unspecified: Secondary | ICD-10-CM | POA: Diagnosis not present

## 2018-03-25 DIAGNOSIS — Z0000 Encounter for general adult medical examination without abnormal findings: Secondary | ICD-10-CM

## 2018-03-25 DIAGNOSIS — Z23 Encounter for immunization: Secondary | ICD-10-CM

## 2018-03-25 LAB — CBC WITH DIFFERENTIAL/PLATELET
BASOS PCT: 0.6 % (ref 0.0–3.0)
Basophils Absolute: 0 10*3/uL (ref 0.0–0.1)
EOS PCT: 2.1 % (ref 0.0–5.0)
Eosinophils Absolute: 0.1 10*3/uL (ref 0.0–0.7)
HCT: 40.7 % (ref 36.0–46.0)
HEMOGLOBIN: 13.8 g/dL (ref 12.0–15.0)
LYMPHS ABS: 1.9 10*3/uL (ref 0.7–4.0)
Lymphocytes Relative: 37.5 % (ref 12.0–46.0)
MCHC: 33.9 g/dL (ref 30.0–36.0)
MCV: 95.2 fl (ref 78.0–100.0)
MONO ABS: 0.7 10*3/uL (ref 0.1–1.0)
Monocytes Relative: 12.7 % — ABNORMAL HIGH (ref 3.0–12.0)
Neutro Abs: 2.4 10*3/uL (ref 1.4–7.7)
Neutrophils Relative %: 47.1 % (ref 43.0–77.0)
Platelets: 229 10*3/uL (ref 150.0–400.0)
RBC: 4.28 Mil/uL (ref 3.87–5.11)
RDW: 13.2 % (ref 11.5–15.5)
WBC: 5.1 10*3/uL (ref 4.0–10.5)

## 2018-03-25 LAB — HEPATIC FUNCTION PANEL
ALK PHOS: 66 U/L (ref 39–117)
ALT: 13 U/L (ref 0–35)
AST: 16 U/L (ref 0–37)
Albumin: 4.5 g/dL (ref 3.5–5.2)
BILIRUBIN DIRECT: 0.1 mg/dL (ref 0.0–0.3)
BILIRUBIN TOTAL: 0.8 mg/dL (ref 0.2–1.2)
Total Protein: 7 g/dL (ref 6.0–8.3)

## 2018-03-25 LAB — BASIC METABOLIC PANEL
BUN: 14 mg/dL (ref 6–23)
CO2: 29 meq/L (ref 19–32)
Calcium: 9.6 mg/dL (ref 8.4–10.5)
Chloride: 102 mEq/L (ref 96–112)
Creatinine, Ser: 0.78 mg/dL (ref 0.40–1.20)
GFR: 79.33 mL/min (ref >60.00)
GLUCOSE: 106 mg/dL — AB (ref 70–99)
POTASSIUM: 4.3 meq/L (ref 3.5–5.1)
SODIUM: 137 meq/L (ref 135–145)

## 2018-03-25 LAB — LIPID PANEL
CHOLESTEROL: 203 mg/dL — AB (ref 0–200)
HDL: 71.2 mg/dL (ref >39.00)
LDL Cholesterol: 118 mg/dL — ABNORMAL HIGH (ref 0–99)
NONHDL: 131.95
Total CHOL/HDL Ratio: 3
Triglycerides: 72 mg/dL (ref 0.0–149.0)
VLDL: 14.4 mg/dL (ref 0.0–40.0)

## 2018-03-25 LAB — TSH: TSH: 1.28 u[IU]/mL (ref 0.35–4.50)

## 2018-03-25 LAB — VITAMIN D 25 HYDROXY (VIT D DEFICIENCY, FRACTURES): VITD: 32.51 ng/mL (ref 30.00–100.00)

## 2018-03-25 NOTE — Assessment & Plan Note (Signed)
Pt w/ hx of Vit D deficiency.  Check labs and replete prn.

## 2018-03-25 NOTE — Progress Notes (Signed)
   Subjective:    Patient ID: [name] [name], female    DOB: [birth year], 62 y.o.   MRN: [id number]  HPI CPE- UTD on mammo, colonoscopy, Tdap.  Due for flu.   Review of Systems Patient reports no vision/ hearing changes, adenopathy,fever, weight change,  persistant/recurrent hoarseness , swallowing issues, chest pain, palpitations, edema, persistant/recurrent cough, hemoptysis, dyspnea (rest/exertional/paroxysmal nocturnal), gastrointestinal bleeding (melena, rectal bleeding), abdominal pain, significant heartburn, bowel changes, GU symptoms (dysuria, hematuria, incontinence), Gyn symptoms (abnormal  bleeding, pain),  syncope, focal weakness, memory loss, numbness & tingling, skin/hair/nail changes, abnormal bruising or bleeding, anxiety, or depression.     Objective:   Physical Exam General Appearance:    Alert, cooperative, no distress, appears stated age  Head:    Normocephalic, without obvious abnormality, atraumatic  Eyes:    PERRL, conjunctiva/corneas clear, EOM's intact, fundi    benign, both eyes  Ears:    Normal TM's and external ear canals, both ears  Nose:   Nares normal, septum midline, mucosa normal, no drainage    or sinus tenderness  Throat:   Lips, mucosa, and tongue normal; teeth and gums normal  Neck:   Supple, symmetrical, trachea midline, no adenopathy;    Thyroid: no enlargement/tenderness/nodules  Back:     Symmetric, no curvature, ROM normal, no CVA tenderness  Lungs:     Clear to auscultation bilaterally, respirations unlabored  Chest Wall:    No tenderness or deformity   Heart:    Regular rate and rhythm, S1 and S2 normal, no murmur, rub   or gallop  Breast Exam:    Deferred to mammo  Abdomen:     Soft, non-tender, bowel sounds active all four quadrants,    no masses, no organomegaly  Genitalia:    Deferred  Rectal:    Extremities:   Extremities normal, atraumatic, no cyanosis or edema  Pulses:   2+ and symmetric all extremities  Skin:   Skin color, texture,  turgor normal, no rashes or lesions  Lymph nodes:   Cervical, supraclavicular, and axillary nodes normal  Neurologic:   CNII-XII intact, normal strength, sensation and reflexes    throughout          Assessment & Plan:

## 2018-03-25 NOTE — Assessment & Plan Note (Signed)
Chronic problem.  Attempting to control w/ diet and exercise.  Check labs and determine if meds are needed. 

## 2018-03-25 NOTE — Patient Instructions (Signed)
Follow up in 1 year or as needed We'll notify you of your lab results and make any changes if needed Keep up the good work on healthy diet and regular exercise- you look great! Call with any questions or concerns Happy Fall!!

## 2018-03-25 NOTE — Assessment & Plan Note (Signed)
Pt's PE WNL.  UTD on colonoscopy, mammo, Tdap.  Flu shot given today.  Check labs.  Anticipatory guidance provided.

## 2018-03-26 ENCOUNTER — Encounter: Payer: Self-pay | Admitting: General Practice

## 2018-03-27 ENCOUNTER — Encounter: Payer: Self-pay | Admitting: Family Medicine

## 2018-05-02 ENCOUNTER — Ambulatory Visit: Payer: 59 | Admitting: Pulmonary Disease

## 2018-05-05 ENCOUNTER — Telehealth: Payer: Self-pay | Admitting: Hematology and Oncology

## 2018-05-05 NOTE — Telephone Encounter (Signed)
Returned patient's voicemail message regarding appt with Dr. Lindi Adie.  Asked patient to call us back.

## 2018-05-07 ENCOUNTER — Telehealth: Payer: Self-pay | Admitting: Hematology and Oncology

## 2018-05-07 NOTE — Telephone Encounter (Signed)
R/s appt per 12/09 sch message - pt is aware of appt date and time

## 2018-05-16 ENCOUNTER — Ambulatory Visit: Payer: 59 | Admitting: Pulmonary Disease

## 2018-05-16 ENCOUNTER — Encounter: Payer: Self-pay | Admitting: Pulmonary Disease

## 2018-05-16 VITALS — BP 122/64 | HR 96 | Ht 69.0 in | Wt 177.0 lb

## 2018-05-16 DIAGNOSIS — Z9989 Dependence on other enabling machines and devices: Secondary | ICD-10-CM

## 2018-05-16 DIAGNOSIS — G4733 Obstructive sleep apnea (adult) (pediatric): Secondary | ICD-10-CM

## 2018-05-16 NOTE — Patient Instructions (Signed)
Can look up CPAP mask options online and email if you find a mask you would like to try  Follow up in 1 year

## 2018-05-16 NOTE — Progress Notes (Signed)
[facility]  Chief Complaint  Patient presents with  . Follow-up    pt wearing cpap avg 8hr nightly, feels pressure & mask are okay. LHT:[id number]    Constitutional:  BP 122/64 (BP Location: Left Arm, Cuff Size: Normal)   Pulse 96   Ht 5\' 9"  (1.753 m)   Wt 177 lb (80.3 kg)   SpO2 97%   BMI 26.14 kg/m   Past Medical History:  Breast cancer 2016, SVT  Brief Summary:  [name] [name] is a 62 y.o. female former smoker with obstructive sleep apnea.  She is doing well with CPAP.  Only issue is with mask fit.  She tried full face mask, but mask kept shifting.  Using nasal mask, but gets mouth leak at times.  Also concerned that straps leave mark on her face.  Not having sore throat, aerophagia, or sinus congestion.    Physical Exam:   Appearance - well kempt   ENMT - clear nasal mucosa, midline nasal  septum, no oral exudates, no LAN, trachea midline  Respiratory - normal chest wall, normal respiratory effort, no accessory muscle use, no wheeze/rales  CV - s1s2 regular rate and rhythm, no murmurs, no peripheral edema, radial pulses symmetric  GI - soft, non tender, no masses  Lymph - no adenopathy noted in neck and axillary areas  MSK - normal gait  Ext - no cyanosis, clubbing, or joint inflammation noted  Skin - no rashes, lesions, or ulcers  Neuro - normal strength, oriented x 3  Psych - normal mood and affect   Assessment/Plan:   Obstructive sleep apnea. - she is compliant with CPAP and reports benefit - continue auto CPAP - discussed options to assist with mask fit - she will email if she finds a mask she wants to switch to >> she might be interested in a hybrid mask    Patient Instructions  Can look up CPAP mask options online and email if you find a mask you would like to try  Follow up in 1 year    Chesley Mires, MD [place] Pager: [phone] 05/16/2018, 4:53 PM  Flow  Sheet      Sleep tests:  HST 04/16/16 >> AHI 14.2, SaO2 low 77% Auto CPAP 04/11/18 to 04/20/18 >> used on 10 of 10 nights average 7 hrs 6 min.  Average AHI 1.9 with median CPAP 7 and 95 th percentile CPAP 10 cm H2O.  Medications:   Allergies as of 05/16/2018   No Known Allergies     Medication List       Accurate as of May 16, 2018  4:53 PM. Always use your most recent med list.        anastrozole 1 MG tablet Commonly known as:  ARIMIDEX TAKE ONE TABLET BY MOUTH ONCE DAILY   UNABLE TO FIND Dispense compression sleeve 20-30 mm/Hg for this pt with history of breast cancer s/p surgery.   Vitamin D3 50 MCG (2000 UT) Tabs Take 2,000 Units by mouth daily.   vitamin E 1000 UNIT capsule Take 1,000 Units by mouth daily.       Past Surgical History:  She  has a past surgical history that includes Stapedectomy (Right); Abdominal hysterectomy (1999); SVT ablation (2000); Knee cartilage surgery; Breast lumpectomy with radioactive seed and sentinel lymph node biopsy (Right, 11/02/2014); Lipoma excision (N/A, 03/23/2015); and Colonoscopy.  Family History:  Her family history includes Acute lymphoblastic leukemia in her paternal grandfather; Bladder Cancer in her  father; CVA in her maternal grandmother and mother; Heart attack in her maternal grandfather; Leukemia in her paternal grandfather; Lung cancer in her paternal aunt, paternal uncle, and paternal uncle; Ovarian cancer in her paternal aunt; Supraventricular tachycardia in her brother, grandchild, sister, and son.  Social History:  She  reports that she quit smoking about 40 years ago. She has a 12.00 pack-year smoking history. She has never used smokeless tobacco. She reports current alcohol use of about 4.0 - 5.0 standard drinks of alcohol per week. She reports that she does not use drugs.

## 2018-05-20 ENCOUNTER — Other Ambulatory Visit: Payer: Self-pay

## 2018-05-20 DIAGNOSIS — C50411 Malignant neoplasm of upper-outer quadrant of right female breast: Secondary | ICD-10-CM

## 2018-05-20 MED ORDER — ANASTROZOLE 1 MG PO TABS: 1.0000 mg | ORAL_TABLET | Freq: Every day | ORAL | 0 refills | Status: DC

## 2018-07-01 ENCOUNTER — Ambulatory Visit: Payer: 59 | Admitting: Hematology and Oncology

## 2018-07-18 NOTE — Progress Notes (Signed)
Patient Care Team: Midge Minium, MD as PCP - General (Family Medicine) Nicholas Lose, MD as Consulting Physician (Hematology and Oncology)  DIAGNOSIS:    ICD-10-CM   1. Primary cancer of upper outer quadrant of right breast (HCC) C50.411 anastrozole (ARIMIDEX) 1 MG tablet    SUMMARY OF ONCOLOGIC HISTORY:   Primary cancer of upper outer quadrant of right breast ([place])   09/30/2014 Initial Diagnosis    Right breast invasive ductal carcinoma grade 1, Ki-67 5%, ER positive, PR positive, HER-2 negative, 1.4 cm mass at 11:30 position no lymph nodes by ultrasound T1 cN0 M0 stage IA clinical stage    11/02/2014 Surgery    Right lumpectomy: Invasive ductal carcinoma with extracellular mucin, grade 2/3, 1.8 cm, 0/2 lymph nodes negative, ER 98%, PR 63%, HER-2 negative ratio 1.58, Ki-67 7%, Oncotype DX score 12, 8% risk of recurrence, T1 cN0 stage IA    12/28/2014 - 01/27/2015 Radiation Therapy    Adjuvant radiation therapy with boost    03/01/2015 -  Anti-estrogen oral therapy    Anastrozole 1 mg daily     CHIEF COMPLIANT: Follow-up on anastrozole therapy  INTERVAL HISTORY: [name] [name] is a 63 y.o. with above-mentioned history of right breast cancer treated with lumpectomy radiation is currently on anastrozole therapy, taking half a tablet in the morning and at night. I last saw the patient one year ago. Her most recent mammogram from 10/17/17 showed no evidence of malignancy. She presents to the clinic alone today and recently returned from visiting family in [place]. She denies any hot flashes. She plays pickle ball three times a week.   REVIEW OF SYSTEMS:   Constitutional: Denies fevers, chills or abnormal weight loss Eyes: Denies blurriness of vision Ears, nose, mouth, throat, and face: Denies mucositis or sore throat Respiratory: Denies cough, dyspnea or wheezes Cardiovascular: Denies palpitation, chest discomfort Gastrointestinal: Denies nausea, heartburn or change in bowel  habits Skin: Denies abnormal skin rashes Lymphatics: Denies new lymphadenopathy or easy bruising Neurological: Denies numbness, tingling or new weaknesses Behavioral/Psych: Mood is stable, no new changes  Extremities: No lower extremity edema Breast: denies any pain or lumps or nodules in either breasts All other systems were reviewed with the patient and are negative.  I have reviewed the past medical history, past surgical history, social history and family history with the patient and they are unchanged from previous note.  ALLERGIES:  has No Known Allergies.  MEDICATIONS:  Current Outpatient Medications  Medication Sig Dispense Refill  . anastrozole (ARIMIDEX) 1 MG tablet Take 1 tablet (1 mg total) by mouth daily. 90 tablet 3  . Cholecalciferol (VITAMIN D3) 2000 units TABS Take 2,000 Units by mouth daily.    Marland Kitchen UNABLE TO FIND Dispense compression sleeve 20-30 mm/Hg for this pt with history of breast cancer s/p surgery. 1 each 0  . vitamin E 1000 UNIT capsule Take 1,000 Units by mouth daily.     No current facility-administered medications for this visit.     PHYSICAL EXAMINATION: ECOG PERFORMANCE STATUS: 0 - Asymptomatic  Vitals:   07/22/18 1030  BP: (!) 134/94  Pulse: 71  Resp: 17  Temp: 98.4 F (36.9 C)  SpO2: 100%   Filed Weights   07/22/18 1030  Weight: 171 lb 14.4 oz (78 kg)    GENERAL: alert, no distress and comfortable SKIN: skin color, texture, turgor are normal, no rashes or significant lesions EYES: normal, Conjunctiva are pink and non-injected, sclera clear OROPHARYNX: no exudate, no erythema and lips,  buccal mucosa, and tongue normal  NECK: supple, thyroid normal size, non-tender, without nodularity LYMPH: no palpable lymphadenopathy in the cervical, axillary or inguinal LUNGS: clear to auscultation and percussion with normal breathing effort HEART: regular rate & rhythm and no murmurs and no lower extremity edema ABDOMEN: abdomen soft, non-tender and  normal bowel sounds MUSCULOSKELETAL: no cyanosis of digits and no clubbing  NEURO: alert & oriented x 3 with fluent speech, no focal motor/sensory deficits EXTREMITIES: No lower extremity edema BREAST: No palpable masses or nodules in either right or left breasts. No palpable axillary supraclavicular or infraclavicular adenopathy no breast tenderness or nipple discharge. (exam performed in the presence of a chaperone)   LABORATORY DATA:  I have reviewed the data as listed CMP Latest Ref Rng & Units 03/25/2018 03/19/2017 11/02/2015  Glucose 70 - 99 mg/dL 106(H) 108(H) 104(H)  BUN 6 - 23 mg/dL _0 Creatinine 0.40 - 1.20 mg/dL 0.78 0.72 0.68  Sodium 135 - 145 mEq/L 137 139 135  Potassium 3.5 - 5.1 mEq/L 4.3 4.6 4.7  Chloride 96 - 112 mEq/L 102 100 100  CO2 19 - 32 mEq/L 29 32 30  Calcium 8.4 - 10.5 mg/dL 9.6 9.9 9.6  Total Protein 6.0 - 8.3 g/dL 7.0 6.8 6.8  Total Bilirubin 0.2 - 1.2 mg/dL 0.8 0.8 0.8  Alkaline Phos 39 - 117 U/L 66 54 50  AST 0 - 37 U/L _1 ALT 0 - 35 U/L _2 Lab Results  Component Value Date   WBC 5.1 03/25/2018   HGB 13.8 03/25/2018   HCT 40.7 03/25/2018   MCV 95.2 03/25/2018   PLT 229.0 03/25/2018   NEUTROABS 2.4 03/25/2018    ASSESSMENT & PLAN:  Primary cancer of upper outer quadrant of right breast Right lumpectomy 11/02/2014: Invasive ductal carcinoma with extracellular mucin, grade 2/3, 1.8 cm, 0/2 lymph nodes negative, ER 98%, PR 63%, HER-2 negative ratio 1.58, Ki-67 7%, Oncotype DX score 12, 8% risk of recurrence, T1 cN0 stage IA status post radiation completed 01/27/2015  Current treatment: Anastrozole adjuvant therapy 1 mg daily 5 years started October 2016 Anastrozole toxicities:  1. Hot flashesHave resolved since she started taking half a tablet twice a day   Patient was in [place] for the last 7 weeks and just returned back.  She plans to go back to [place] in May as she is expecting a grandchild  Breast Cancer  Surveillance: 1. Breast exam  07/22/2018 tenderness in the right breast: Benign 2. Mammogram 10/17/2017 at [facility] benign. Postsurgical changes.  Breast density category C Return to clinic in1year    No orders of the defined types were placed in this encounter.  The patient has a good understanding of the overall plan. she agrees with it. she will call with any problems that may develop before the next visit here.  Nicholas Lose, MD 07/22/2018  Julious Oka Dorshimer am acting as scribe for Dr. Nicholas Lose.  I have reviewed the above documentation for accuracy and completeness, and I agree with the above.

## 2018-07-22 ENCOUNTER — Telehealth: Payer: Self-pay | Admitting: Hematology and Oncology

## 2018-07-22 ENCOUNTER — Inpatient Hospital Stay: Payer: 59 | Attending: Hematology and Oncology | Admitting: Hematology and Oncology

## 2018-07-22 DIAGNOSIS — Z170 Estrogen receptor positive status [ER+]: Secondary | ICD-10-CM | POA: Diagnosis not present

## 2018-07-22 DIAGNOSIS — C50411 Malignant neoplasm of upper-outer quadrant of right female breast: Secondary | ICD-10-CM | POA: Insufficient documentation

## 2018-07-22 MED ORDER — ANASTROZOLE 1 MG PO TABS: 1.0000 mg | ORAL_TABLET | Freq: Every day | ORAL | 3 refills | Status: DC

## 2018-07-22 NOTE — Telephone Encounter (Signed)
No los °

## 2018-07-22 NOTE — Assessment & Plan Note (Addendum)
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 Hot flashesHave resolved since she started taking half a tablet twice a day   Patient was in [place] for the last 7 weeks and just returned back.  She plans to go back to [place] in May as she is expecting a grandchild  Breast Cancer Surveillance: 1. Breast exam  07/22/2018 tenderness in the right breast: Benign 2. Mammogram 10/17/2017 at [facility] benign. Postsurgical changes.  Breast density category C Return to clinic in1year

## 2018-10-21 LAB — HM MAMMOGRAPHY

## 2018-10-30 ENCOUNTER — Encounter: Payer: Self-pay | Admitting: General Practice

## 2019-03-31 ENCOUNTER — Ambulatory Visit (INDEPENDENT_AMBULATORY_CARE_PROVIDER_SITE_OTHER): Payer: 59 | Admitting: Family Medicine

## 2019-03-31 ENCOUNTER — Other Ambulatory Visit: Payer: Self-pay

## 2019-03-31 ENCOUNTER — Encounter: Payer: Self-pay | Admitting: Family Medicine

## 2019-03-31 VITALS — BP 128/86 | HR 75 | Temp 97.9°F | Resp 16 | Ht 69.0 in | Wt 173.1 lb

## 2019-03-31 DIAGNOSIS — Z23 Encounter for immunization: Secondary | ICD-10-CM

## 2019-03-31 DIAGNOSIS — Z0000 Encounter for general adult medical examination without abnormal findings: Secondary | ICD-10-CM | POA: Diagnosis not present

## 2019-03-31 DIAGNOSIS — E785 Hyperlipidemia, unspecified: Secondary | ICD-10-CM | POA: Diagnosis not present

## 2019-03-31 DIAGNOSIS — R350 Frequency of micturition: Secondary | ICD-10-CM

## 2019-03-31 DIAGNOSIS — E559 Vitamin D deficiency, unspecified: Secondary | ICD-10-CM | POA: Diagnosis not present

## 2019-03-31 LAB — LIPID PANEL
Cholesterol: 258 mg/dL — ABNORMAL HIGH (ref 0–200)
HDL: 79.2 mg/dL (ref >39.00)
LDL Cholesterol: 157 mg/dL — ABNORMAL HIGH (ref 0–99)
NonHDL: 178.38
Total CHOL/HDL Ratio: 3
Triglycerides: 108 mg/dL (ref 0.0–149.0)
VLDL: 21.6 mg/dL (ref 0.0–40.0)

## 2019-03-31 LAB — CBC WITH DIFFERENTIAL/PLATELET
Basophils Absolute: 0 10*3/uL (ref 0.0–0.1)
Basophils Relative: 0.9 % (ref 0.0–3.0)
Eosinophils Absolute: 0.1 10*3/uL (ref 0.0–0.7)
Eosinophils Relative: 2.4 % (ref 0.0–5.0)
HCT: 42 % (ref 36.0–46.0)
Hemoglobin: 13.9 g/dL (ref 12.0–15.0)
Lymphocytes Relative: 31.9 % (ref 12.0–46.0)
Lymphs Abs: 1.7 10*3/uL (ref 0.7–4.0)
MCHC: 33.1 g/dL (ref 30.0–36.0)
MCV: 95.8 fl (ref 78.0–100.0)
Monocytes Absolute: 0.6 10*3/uL (ref 0.1–1.0)
Monocytes Relative: 12 % (ref 3.0–12.0)
Neutro Abs: 2.8 10*3/uL (ref 1.4–7.7)
Neutrophils Relative %: 52.8 % (ref 43.0–77.0)
Platelets: 195 10*3/uL (ref 150.0–400.0)
RBC: 4.38 Mil/uL (ref 3.87–5.11)
RDW: 13.7 % (ref 11.5–15.5)
WBC: 5.2 10*3/uL (ref 4.0–10.5)

## 2019-03-31 LAB — POCT URINALYSIS DIPSTICK
Bilirubin, UA: NEGATIVE
Blood, UA: NEGATIVE
Glucose, UA: NEGATIVE
Ketones, UA: NEGATIVE
Leukocytes, UA: NEGATIVE
Nitrite, UA: NEGATIVE
Protein, UA: NEGATIVE
Spec Grav, UA: 1.01 (ref 1.010–1.025)
Urobilinogen, UA: 0.2 E.U./dL
pH, UA: 6 (ref 5.0–8.0)

## 2019-03-31 LAB — HEPATIC FUNCTION PANEL
ALT: 14 U/L (ref 0–35)
AST: 15 U/L (ref 0–37)
Albumin: 4.5 g/dL (ref 3.5–5.2)
Alkaline Phosphatase: 59 U/L (ref 39–117)
Bilirubin, Direct: 0.1 mg/dL (ref 0.0–0.3)
Total Bilirubin: 0.6 mg/dL (ref 0.2–1.2)
Total Protein: 6.7 g/dL (ref 6.0–8.3)

## 2019-03-31 LAB — BASIC METABOLIC PANEL
BUN: 16 mg/dL (ref 6–23)
CO2: 30 mEq/L (ref 19–32)
Calcium: 9.5 mg/dL (ref 8.4–10.5)
Chloride: 101 mEq/L (ref 96–112)
Creatinine, Ser: 0.7 mg/dL (ref 0.40–1.20)
GFR: 84.29 mL/min (ref >60.00)
Glucose, Bld: 90 mg/dL (ref 70–99)
Potassium: 4.3 mEq/L (ref 3.5–5.1)
Sodium: 138 mEq/L (ref 135–145)

## 2019-03-31 LAB — VITAMIN D 25 HYDROXY (VIT D DEFICIENCY, FRACTURES): VITD: 29.97 ng/mL — ABNORMAL LOW (ref 30.00–100.00)

## 2019-03-31 LAB — TSH: TSH: 1.22 u[IU]/mL (ref 0.35–4.50)

## 2019-03-31 NOTE — Patient Instructions (Signed)
Follow up in 1 year or as needed We'll notify you of your lab results and make any changes if needed Continue to work on healthy diet and regular exercise- you can do it! Call with any questions or concerns Stay Safe!  Stay Healthy! 

## 2019-03-31 NOTE — Assessment & Plan Note (Signed)
Pt has hx of this.  Check labs.  Replete prn. 

## 2019-03-31 NOTE — Assessment & Plan Note (Signed)
Chronic problem.  Attempting to control w/ healthy diet and regular exercise.  Check labs.  Adjust tx prn

## 2019-03-31 NOTE — Assessment & Plan Note (Signed)
Pt's PE WNL.  UTD on colonoscopy, mammo.  Flu given, UTD on Tdap.  Check labs.  Anticipatory guidance provided.

## 2019-03-31 NOTE — Progress Notes (Signed)
   Subjective:    Patient ID: [name] [name], female    DOB: 1956/02/27, 63 y.o.   MRN: [id number]  HPI CPE- UTD on colonoscopy, mammo.  Due for Flu  Review of Systems Patient reports no vision/ hearing changes, adenopathy,fever, weight change,  persistant/recurrent hoarseness , swallowing issues, chest pain, palpitations, edema, persistant/recurrent cough, hemoptysis, dyspnea (rest/exertional/paroxysmal nocturnal), gastrointestinal bleeding (melena, rectal bleeding), abdominal pain, significant heartburn, bowel changes, Gyn symptoms (abnormal  bleeding, pain),  syncope, focal weakness, memory loss, numbness & tingling, skin/hair/nail changes, abnormal bruising or bleeding, anxiety, or depression.   + urinary frequency    Objective:   Physical Exam General Appearance:    Alert, cooperative, no distress, appears stated age  Head:    Normocephalic, without obvious abnormality, atraumatic  Eyes:    PERRL, conjunctiva/corneas clear, EOM's intact, fundi    benign, both eyes  Ears:    Normal TM's and external ear canals, both ears  Nose:   Deferred due to COVID  Throat:   Neck:   Supple, symmetrical, trachea midline, no adenopathy;    Thyroid: no enlargement/tenderness/nodules  Back:     Symmetric, no curvature, ROM normal, no CVA tenderness  Lungs:     Clear to auscultation bilaterally, respirations unlabored  Chest Wall:    No tenderness or deformity   Heart:    Regular rate and rhythm, S1 and S2 normal, no murmur, rub   or gallop  Breast Exam:    Deferred to GYN  Abdomen:     Soft, non-tender, bowel sounds active all four quadrants,    no masses, no organomegaly  Genitalia:    Deferred to GYN  Rectal:    Extremities:   Extremities normal, atraumatic, no cyanosis or edema  Pulses:   2+ and symmetric all extremities  Skin:   Skin color, texture, turgor normal, no rashes or lesions  Lymph nodes:   Cervical, supraclavicular, and axillary nodes normal  Neurologic:   CNII-XII intact,  normal strength, sensation and reflexes    throughout          Assessment & Plan:

## 2019-04-01 ENCOUNTER — Encounter: Payer: Self-pay | Admitting: Family Medicine

## 2019-04-20 ENCOUNTER — Ambulatory Visit: Payer: 59 | Admitting: Family Medicine

## 2019-06-15 ENCOUNTER — Telehealth: Payer: Self-pay | Admitting: Hematology and Oncology

## 2019-06-15 NOTE — Telephone Encounter (Signed)
Returned patient's phone call regarding scheduling an appointment, left a voicemail. 

## 2019-06-19 ENCOUNTER — Telehealth: Payer: Self-pay | Admitting: Hematology and Oncology

## 2019-06-19 NOTE — Telephone Encounter (Signed)
Called patient to schedule follow-up appointment, per patient's request appointment has been made on 02/23.

## 2019-07-01 ENCOUNTER — Ambulatory Visit: Payer: 59 | Admitting: Family Medicine

## 2019-07-06 ENCOUNTER — Telehealth: Payer: Self-pay | Admitting: *Deleted

## 2019-07-06 NOTE — Telephone Encounter (Signed)
Received after hours message from pt, attempt x1 to return call, no answer, LVM to return call to the office.

## 2019-07-20 NOTE — Progress Notes (Signed)
Patient Care Team: Midge Minium, MD as PCP - General (Family Medicine) Nicholas Lose, MD as Consulting Physician (Hematology and Oncology)  DIAGNOSIS:    ICD-10-CM   1. Primary cancer of upper outer quadrant of right breast ([place])  C50.411     SUMMARY OF ONCOLOGIC HISTORY: Oncology History  Primary cancer of upper outer quadrant of right breast ([place])  09/30/2014 Initial Diagnosis   Right breast invasive ductal carcinoma grade 1, Ki-67 5%, ER positive, PR positive, HER-2 negative, 1.4 cm mass at 11:30 position no lymph nodes by ultrasound T1 cN0 M0 stage IA clinical stage   11/02/2014 Surgery   Right lumpectomy: Invasive ductal carcinoma with extracellular mucin, grade 2/3, 1.8 cm, 0/2 lymph nodes negative, ER 98%, PR 63%, HER-2 negative ratio 1.58, Ki-67 7%, Oncotype DX score 12, 8% risk of recurrence, T1 cN0 stage IA   12/28/2014 - 01/27/2015 Radiation Therapy   Adjuvant radiation therapy with boost   03/01/2015 -  Anti-estrogen oral therapy   Anastrozole 1 mg daily     CHIEF COMPLIANT: Follow-up of right breast cancer on anastrozole therapy  INTERVAL HISTORY: [name] [name] is a 64 y.o. with above-mentioned history of right breast cancer treated with lumpectomy, radiation, and who is currently on anastrozole therapy, taking half a tablet in the morning and at night. Mammogram on 10/21/18 showed no evidence of malignancy bilaterally. She presents to the clinic today for follow-up.   ALLERGIES:  has No Known Allergies.  MEDICATIONS:  Current Outpatient Medications  Medication Sig Dispense Refill  . anastrozole (ARIMIDEX) 1 MG tablet Take 1 tablet (1 mg total) by mouth daily. 90 tablet 3  . Cholecalciferol (VITAMIN D3) 2000 units TABS Take 2,000 Units by mouth daily.    Marland Kitchen UNABLE TO FIND Dispense compression sleeve 20-30 mm/Hg for this pt with history of breast cancer s/p surgery. 1 each 0  . vitamin E 1000 UNIT capsule Take 1,000 Units by mouth daily.     No current  facility-administered medications for this visit.    PHYSICAL EXAMINATION: ECOG PERFORMANCE STATUS: 1 - Symptomatic but completely ambulatory  There were no vitals filed for this visit. There were no vitals filed for this visit.  BREAST: No palpable masses or nodules in either right or left breasts. No palpable axillary supraclavicular or infraclavicular adenopathy no breast tenderness or nipple discharge. (exam performed in the presence of a chaperone)  LABORATORY DATA:  I have reviewed the data as listed CMP Latest Ref Rng & Units 03/31/2019 03/25/2018 03/19/2017  Glucose 70 - 99 mg/dL 90 106(H) 108(H)  BUN 6 - 23 mg/dL _0 Creatinine 0.40 - 1.20 mg/dL 0.70 0.78 0.72  Sodium 135 - 145 mEq/L 138 137 139  Potassium 3.5 - 5.1 mEq/L 4.3 4.3 4.6  Chloride 96 - 112 mEq/L 101 102 100  CO2 19 - 32 mEq/L 30 29 32  Calcium 8.4 - 10.5 mg/dL 9.5 9.6 9.9  Total Protein 6.0 - 8.3 g/dL 6.7 7.0 6.8  Total Bilirubin 0.2 - 1.2 mg/dL 0.6 0.8 0.8  Alkaline Phos 39 - 117 U/L 59 66 54  AST 0 - 37 U/L _1 ALT 0 - 35 U/L _2 Lab Results  Component Value Date   WBC 5.2 03/31/2019   HGB 13.9 03/31/2019   HCT 42.0 03/31/2019   MCV 95.8 03/31/2019   PLT 195.0 03/31/2019   NEUTROABS 2.8 03/31/2019    ASSESSMENT & PLAN:  Primary cancer of  upper outer quadrant of right breast Right lumpectomy 11/02/2014: Invasive ductal carcinoma with extracellular mucin, grade 2/3, 1.8 cm, 0/2 lymph nodes negative, ER 98%, PR 63%, HER-2 negative ratio 1.58, Ki-67 7%, Oncotype DX score 12, 8% risk of recurrence, T1 cN0 stage IA status post radiation completed 01/27/2015  Current treatment: Anastrozole adjuvant therapy 1 mg daily 5 years started October 2016 Anastrozole toxicities:  1. Hot flashesHave resolved since she started taking half a tablet twice a day  Patient was unable to see her grandchild in [place] because of COVID-19.  Her grandchild is now 9 months old.  Breast Cancer  Surveillance: 1. Breast exam2/23/2021tenderness in the right breast: Benign 2. Mammogram5/26/2020 at [facility] benign. Postsurgical changes.Breast density category C  We discussed the duration of antiestrogen therapy.  Since she is tolerating it so well we will plan to treat her for 7 years.  I ordered a bone density to be done along with her mammogram in May.  We will touch base with her on the phone after the bone density.  Return to clinic in1year No orders of the defined types were placed in this encounter.  The patient has a good understanding of the overall plan. she agrees with it. she will call with any problems that may develop before the next visit here.  Total time spent: 20 mins including face to face time and time spent for planning, charting and coordination of care  Gudena, Vinay, MD 07/21/2019  I, Molly Dorshimer, am acting as scribe for Dr. Vinay Gudena.  I have reviewed the above documentation for accuracy and completeness, and I agree with the above.       

## 2019-07-21 ENCOUNTER — Other Ambulatory Visit: Payer: Self-pay

## 2019-07-21 ENCOUNTER — Inpatient Hospital Stay: Payer: 59 | Attending: Hematology and Oncology | Admitting: Hematology and Oncology

## 2019-07-21 VITALS — BP 146/87 | HR 74 | Temp 98.0°F | Resp 18 | Ht 69.0 in | Wt 173.0 lb

## 2019-07-21 DIAGNOSIS — Z79811 Long term (current) use of aromatase inhibitors: Secondary | ICD-10-CM | POA: Diagnosis not present

## 2019-07-21 DIAGNOSIS — Z780 Asymptomatic menopausal state: Secondary | ICD-10-CM

## 2019-07-21 DIAGNOSIS — C50411 Malignant neoplasm of upper-outer quadrant of right female breast: Secondary | ICD-10-CM

## 2019-07-21 DIAGNOSIS — Z923 Personal history of irradiation: Secondary | ICD-10-CM | POA: Diagnosis not present

## 2019-07-21 DIAGNOSIS — Z170 Estrogen receptor positive status [ER+]: Secondary | ICD-10-CM | POA: Insufficient documentation

## 2019-07-21 MED ORDER — ANASTROZOLE 1 MG PO TABS: 1.0000 mg | ORAL_TABLET | Freq: Every day | ORAL | 3 refills | Status: DC

## 2019-07-21 NOTE — Assessment & Plan Note (Signed)
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 Hot flashesHave resolved since she started taking half a tablet twice a day  Patient was in [place] for the last 7 weeks and just returned back.  She plans to go back to [place] in May as she is expecting a grandchild  Breast Cancer Surveillance: 1. Breast exam2/23/2021tenderness in the right breast: Benign 2. Mammogram5/26/2020 at [facility] benign. Postsurgical changes.Breast density category C Return to clinic in1year

## 2019-07-23 ENCOUNTER — Other Ambulatory Visit: Payer: Self-pay | Admitting: Hematology and Oncology

## 2019-07-23 DIAGNOSIS — Z1231 Encounter for screening mammogram for malignant neoplasm of breast: Secondary | ICD-10-CM

## 2019-07-28 ENCOUNTER — Other Ambulatory Visit: Payer: Self-pay

## 2019-07-28 ENCOUNTER — Encounter: Payer: Self-pay | Admitting: Family Medicine

## 2019-07-28 ENCOUNTER — Ambulatory Visit (INDEPENDENT_AMBULATORY_CARE_PROVIDER_SITE_OTHER): Payer: 59 | Admitting: Family Medicine

## 2019-07-28 VITALS — BP 123/83 | HR 65 | Temp 97.9°F | Resp 16 | Ht 69.0 in | Wt 174.2 lb

## 2019-07-28 DIAGNOSIS — E785 Hyperlipidemia, unspecified: Secondary | ICD-10-CM

## 2019-07-28 DIAGNOSIS — M25461 Effusion, right knee: Secondary | ICD-10-CM

## 2019-07-28 LAB — HEPATIC FUNCTION PANEL
ALT: 11 U/L (ref 0–35)
AST: 12 U/L (ref 0–37)
Albumin: 4 g/dL (ref 3.5–5.2)
Alkaline Phosphatase: 62 U/L (ref 39–117)
Bilirubin, Direct: 0.1 mg/dL (ref 0.0–0.3)
Total Bilirubin: 0.5 mg/dL (ref 0.2–1.2)
Total Protein: 6.5 g/dL (ref 6.0–8.3)

## 2019-07-28 LAB — LIPID PANEL
Cholesterol: 222 mg/dL — ABNORMAL HIGH (ref 0–200)
HDL: 68.1 mg/dL (ref >39.00)
LDL Cholesterol: 126 mg/dL — ABNORMAL HIGH (ref 0–99)
NonHDL: 153.71
Total CHOL/HDL Ratio: 3
Triglycerides: 139 mg/dL (ref 0.0–149.0)
VLDL: 27.8 mg/dL (ref 0.0–40.0)

## 2019-07-28 NOTE — Assessment & Plan Note (Signed)
Chronic problem.  Eating oatmeal daily.  Has not yet started Red Yeast Rice.  Applauded her efforts at diet and exercise. Check labs.  Adjust meds prn

## 2019-07-28 NOTE — Patient Instructions (Addendum)
Schedule your complete physical for November We'll notify you of your lab results and make any changes if needed Keep up the good work on healthy diet and regular exercise- you're doing great! Wear a knee sleeve while exercising and ice afterwards If the knee pain worsens, please let me know so we can refer to Ortho Call with any questions or concerns Stay Safe!  Stay Healthy!

## 2019-07-28 NOTE — Progress Notes (Signed)
   Subjective:    Patient ID: [name] [name], female    DOB: Apr 08, 1956, 64 y.o.   MRN: [id number]  HPI Hyperlipidemia- chronic problem.  Has been attempting to control w/ diet and exercise.  Last LDL was 157 and Red Yeast Rice was recommended.  Not doing Red Yeast Rice but eating oatmeal daily.  'i'm feeling great'.  Denies CP, SOB, HAs, visual changes, abd pain  R knee swelling- occurs after playing pickle ball.  Doesn't restrict movement while exercising but unable to fully flex or extend knee.  sxs started ~6 months ago.  Had injury to knee years ago.  Has increased frequency of activity and is playing on hard surface.  Denies pain but swelling never completely resolves.     Review of Systems For ROS see HPI   This visit occurred during the SARS-CoV-2 public health emergency.  Safety protocols were in place, including screening questions prior to the visit, additional usage of staff PPE, and extensive cleaning of exam room while observing appropriate contact time as indicated for disinfecting solutions.       Objective:   Physical Exam Vitals reviewed.  Constitutional:      General: She is not in acute distress.    Appearance: She is well-developed.  HENT:     Head: Normocephalic and atraumatic.  Eyes:     Conjunctiva/sclera: Conjunctivae normal.     Pupils: Pupils are equal, round, and reactive to light.  Neck:     Thyroid: No thyromegaly.  Cardiovascular:     Rate and Rhythm: Normal rate and regular rhythm.     Pulses: Normal pulses.     Heart sounds: Normal heart sounds. No murmur.  Pulmonary:     Effort: Pulmonary effort is normal. No respiratory distress.     Breath sounds: Normal breath sounds.  Abdominal:     General: There is no distension.     Palpations: Abdomen is soft.     Tenderness: There is no abdominal tenderness.  Musculoskeletal:     Cervical back: Normal range of motion and neck supple.     Comments: R knee larger than L Full ROM w/ exception of  extremes of flexion/extension  Lymphadenopathy:     Cervical: No cervical adenopathy.  Skin:    General: Skin is warm and dry.  Neurological:     Mental Status: She is alert and oriented to person, place, and time.  Psychiatric:        Behavior: Behavior normal.           Assessment & Plan:  R knee swelling- new.  Pt has hx of injury years ago.  In the last 6 months she has increased her activity level and notes R knee is swollen and larger than L knee.  Denies pain.  Encouraged ice and knee sleeve for support.  If no improvement, will refer to Ortho.  Pt expressed understanding and is in agreement w/ plan.

## 2019-07-30 ENCOUNTER — Encounter: Payer: Self-pay | Admitting: General Practice

## 2019-08-03 LAB — HM DEXA SCAN

## 2019-08-07 ENCOUNTER — Encounter: Payer: Self-pay | Admitting: General Practice

## 2019-08-07 ENCOUNTER — Other Ambulatory Visit: Payer: Self-pay

## 2019-08-07 DIAGNOSIS — C50411 Malignant neoplasm of upper-outer quadrant of right female breast: Secondary | ICD-10-CM

## 2019-08-07 MED ORDER — ANASTROZOLE 1 MG PO TABS: 1.0000 mg | ORAL_TABLET | Freq: Every day | ORAL | 0 refills | Status: DC

## 2019-08-12 ENCOUNTER — Telehealth: Payer: Self-pay | Admitting: *Deleted

## 2019-08-12 ENCOUNTER — Encounter: Payer: Self-pay | Admitting: Hematology and Oncology

## 2019-08-12 ENCOUNTER — Encounter: Payer: Self-pay | Admitting: Family Medicine

## 2019-08-12 NOTE — Telephone Encounter (Signed)
Received bone density report from [facility] showing a T-score of -1.2.  RN placed call to pt to review results.  Pt states she is currently taking Vitamin D3 and preforming weight-bearing exercise.  Per MD pt to start taking 1200 mg of p.o calcium as well.  Pt verbalized understanding and appreciative of the call.

## 2019-10-12 ENCOUNTER — Other Ambulatory Visit: Payer: 59

## 2019-10-17 ENCOUNTER — Other Ambulatory Visit: Payer: Self-pay | Admitting: Hematology and Oncology

## 2019-10-17 DIAGNOSIS — C50411 Malignant neoplasm of upper-outer quadrant of right female breast: Secondary | ICD-10-CM

## 2019-10-25 ENCOUNTER — Other Ambulatory Visit: Payer: Self-pay | Admitting: Hematology and Oncology

## 2019-10-25 DIAGNOSIS — C50411 Malignant neoplasm of upper-outer quadrant of right female breast: Secondary | ICD-10-CM

## 2019-11-03 ENCOUNTER — Encounter: Payer: Self-pay | Admitting: Hematology and Oncology

## 2020-04-01 ENCOUNTER — Other Ambulatory Visit: Payer: Self-pay

## 2020-04-01 ENCOUNTER — Encounter: Payer: Self-pay | Admitting: Family Medicine

## 2020-04-01 ENCOUNTER — Ambulatory Visit (INDEPENDENT_AMBULATORY_CARE_PROVIDER_SITE_OTHER): Payer: 59 | Admitting: Family Medicine

## 2020-04-01 VITALS — BP 120/80 | HR 60 | Temp 97.6°F | Resp 15 | Ht 70.0 in | Wt 174.2 lb

## 2020-04-01 DIAGNOSIS — Z23 Encounter for immunization: Secondary | ICD-10-CM | POA: Diagnosis not present

## 2020-04-01 DIAGNOSIS — Z0000 Encounter for general adult medical examination without abnormal findings: Secondary | ICD-10-CM

## 2020-04-01 DIAGNOSIS — E559 Vitamin D deficiency, unspecified: Secondary | ICD-10-CM

## 2020-04-01 DIAGNOSIS — E785 Hyperlipidemia, unspecified: Secondary | ICD-10-CM | POA: Diagnosis not present

## 2020-04-01 LAB — CBC WITH DIFFERENTIAL/PLATELET
Basophils Absolute: 0 10*3/uL (ref 0.0–0.1)
Basophils Relative: 0.9 % (ref 0.0–3.0)
Eosinophils Absolute: 0.1 10*3/uL (ref 0.0–0.7)
Eosinophils Relative: 2.9 % (ref 0.0–5.0)
HCT: 41.8 % (ref 36.0–46.0)
Hemoglobin: 13.9 g/dL (ref 12.0–15.0)
Lymphocytes Relative: 37.7 % (ref 12.0–46.0)
Lymphs Abs: 1.7 10*3/uL (ref 0.7–4.0)
MCHC: 33.3 g/dL (ref 30.0–36.0)
MCV: 95.6 fl (ref 78.0–100.0)
Monocytes Absolute: 0.5 10*3/uL (ref 0.1–1.0)
Monocytes Relative: 12 % (ref 3.0–12.0)
Neutro Abs: 2.1 10*3/uL (ref 1.4–7.7)
Neutrophils Relative %: 46.5 % (ref 43.0–77.0)
Platelets: 199 10*3/uL (ref 150.0–400.0)
RBC: 4.37 Mil/uL (ref 3.87–5.11)
RDW: 13.5 % (ref 11.5–15.5)
WBC: 4.5 10*3/uL (ref 4.0–10.5)

## 2020-04-01 LAB — BASIC METABOLIC PANEL
BUN: 17 mg/dL (ref 6–23)
CO2: 32 mEq/L (ref 19–32)
Calcium: 9.4 mg/dL (ref 8.4–10.5)
Chloride: 104 mEq/L (ref 96–112)
Creatinine, Ser: 0.75 mg/dL (ref 0.40–1.20)
GFR: 83.9 mL/min (ref >60.00)
Glucose, Bld: 98 mg/dL (ref 70–99)
Potassium: 4.9 mEq/L (ref 3.5–5.1)
Sodium: 141 mEq/L (ref 135–145)

## 2020-04-01 LAB — HEPATIC FUNCTION PANEL
ALT: 12 U/L (ref 0–35)
AST: 14 U/L (ref 0–37)
Albumin: 4.4 g/dL (ref 3.5–5.2)
Alkaline Phosphatase: 55 U/L (ref 39–117)
Bilirubin, Direct: 0.1 mg/dL (ref 0.0–0.3)
Total Bilirubin: 0.7 mg/dL (ref 0.2–1.2)
Total Protein: 6.6 g/dL (ref 6.0–8.3)

## 2020-04-01 LAB — LIPID PANEL
Cholesterol: 229 mg/dL — ABNORMAL HIGH (ref 0–200)
HDL: 80.7 mg/dL (ref >39.00)
LDL Cholesterol: 121 mg/dL — ABNORMAL HIGH (ref 0–99)
NonHDL: 147.92
Total CHOL/HDL Ratio: 3
Triglycerides: 133 mg/dL (ref 0.0–149.0)
VLDL: 26.6 mg/dL (ref 0.0–40.0)

## 2020-04-01 LAB — TSH: TSH: 1.81 u[IU]/mL (ref 0.35–4.50)

## 2020-04-01 LAB — VITAMIN D 25 HYDROXY (VIT D DEFICIENCY, FRACTURES): VITD: 25.48 ng/mL — ABNORMAL LOW (ref 30.00–100.00)

## 2020-04-01 NOTE — Assessment & Plan Note (Signed)
Pt's PE WNL.  UTD on mammo, colonoscopy, Tdap, COVID.  Flu shot given today.  Check labs.  Anticipatory guidance provided.

## 2020-04-01 NOTE — Patient Instructions (Signed)
Follow up in 1 year or as needed We'll notify you of your lab results and make any changes if needed Continue to work on healthy diet and regular exercise- you look great! Call with any questions or concerns Stay Safe!!  Stay Healthy!! Travel Safe! Happy Holidays!

## 2020-04-01 NOTE — Assessment & Plan Note (Signed)
Check labs and replete prn. 

## 2020-04-01 NOTE — Progress Notes (Signed)
   Subjective:    Patient ID: [name] [name], female    DOB: [birth year], 64 y.o.   MRN: [id number]  HPI CPE- UTD on mammo, colonoscopy, DEXA, Tdap, COVID.  Will get flu today.    Reviewed past medical, surgical, family and social histories.   Health Maintenance  Topic Date Due  . COVID-19 Vaccine (1) Never done  . INFLUENZA VACCINE  12/27/2019  . COLONOSCOPY  01/31/2021  . MAMMOGRAM  11/02/2021  . TETANUS/TDAP  11/01/2025  . Hepatitis C Screening  Completed  . HIV Screening  Completed    Patient Care Team    Relationship Specialty Notifications Start End  Midge Minium, MD PCP - General Family Medicine  11/02/15   Nicholas Lose, MD Consulting Physician Hematology and Oncology  11/02/15       Review of Systems Patient reports no vision/ hearing changes, adenopathy,fever, weight change,  persistant/recurrent hoarseness , swallowing issues, chest pain, palpitations, edema, persistant/recurrent cough, hemoptysis, dyspnea (rest/exertional/paroxysmal nocturnal), gastrointestinal bleeding (melena, rectal bleeding), abdominal pain, significant heartburn, bowel changes, GU symptoms (dysuria, hematuria, incontinence), Gyn symptoms (abnormal  bleeding, pain),  syncope, focal weakness, memory loss, numbness & tingling, skin/hair/nail changes, abnormal bruising or bleeding, anxiety, or depression.   This visit occurred during the SARS-CoV-2 public health emergency.  Safety protocols were in place, including screening questions prior to the visit, additional usage of staff PPE, and extensive cleaning of exam room while observing appropriate contact time as indicated for disinfecting solutions.       Objective:   Physical Exam General Appearance:    Alert, cooperative, no distress, appears stated age  Head:    Normocephalic, without obvious abnormality, atraumatic  Eyes:    PERRL, conjunctiva/corneas clear, EOM's intact, fundi    benign, both eyes  Ears:    Normal TM's and external ear  canals, both ears  Nose:   Deferred due to COVID  Throat:   Neck:   Supple, symmetrical, trachea midline, no adenopathy;    Thyroid: no enlargement/tenderness/nodules  Back:     Symmetric, no curvature, ROM normal, no CVA tenderness  Lungs:     Clear to auscultation bilaterally, respirations unlabored  Chest Wall:    No tenderness or deformity   Heart:    Regular rate and rhythm, S1 and S2 normal, no murmur, rub   or gallop  Breast Exam:    Deferred to mammo  Abdomen:     Soft, non-tender, bowel sounds active all four quadrants,    no masses, no organomegaly  Genitalia:    Deferred  Rectal:    Extremities:   Extremities normal, atraumatic, no cyanosis or edema  Pulses:   2+ and symmetric all extremities  Skin:   Skin color, texture, turgor normal, no rashes or lesions  Lymph nodes:   Cervical, supraclavicular, and axillary nodes normal  Neurologic:   CNII-XII intact, normal strength, sensation and reflexes    throughout          Assessment & Plan:

## 2020-04-01 NOTE — Assessment & Plan Note (Signed)
Ongoing issue but pt has been able to control w/ healthy diet and regular exercise.  Check labs and adjust tx plan prn.

## 2020-04-04 ENCOUNTER — Other Ambulatory Visit: Payer: Self-pay

## 2020-04-04 DIAGNOSIS — E559 Vitamin D deficiency, unspecified: Secondary | ICD-10-CM

## 2020-04-04 MED ORDER — VITAMIN D (ERGOCALCIFEROL) 1.25 MG (50000 UNIT) PO CAPS: 50000.0000 [IU] | ORAL_CAPSULE | ORAL | 0 refills | Status: DC

## 2020-04-04 NOTE — Progress Notes (Signed)
erg ?

## 2020-04-27 ENCOUNTER — Other Ambulatory Visit: Payer: Self-pay

## 2020-04-27 DIAGNOSIS — E559 Vitamin D deficiency, unspecified: Secondary | ICD-10-CM

## 2020-04-27 MED ORDER — VITAMIN D (ERGOCALCIFEROL) 1.25 MG (50000 UNIT) PO CAPS: 50000.0000 [IU] | ORAL_CAPSULE | ORAL | 0 refills | Status: DC

## 2020-05-05 ENCOUNTER — Telehealth: Payer: Self-pay | Admitting: Hematology and Oncology

## 2020-05-05 NOTE — Telephone Encounter (Signed)
Called pt per 12/8 sch msg - no answer left message for pt to call back to reschedule.

## 2020-05-05 NOTE — Telephone Encounter (Signed)
Rescheduled appointment per patient request. Spoke to patient who is aware of updated appointment date and time.

## 2020-05-09 ENCOUNTER — Telehealth (INDEPENDENT_AMBULATORY_CARE_PROVIDER_SITE_OTHER): Payer: 59 | Admitting: Physician Assistant

## 2020-05-09 ENCOUNTER — Other Ambulatory Visit: Payer: Self-pay

## 2020-05-09 ENCOUNTER — Encounter: Payer: Self-pay | Admitting: Physician Assistant

## 2020-05-09 DIAGNOSIS — J019 Acute sinusitis, unspecified: Secondary | ICD-10-CM | POA: Diagnosis not present

## 2020-05-09 DIAGNOSIS — B9689 Other specified bacterial agents as the cause of diseases classified elsewhere: Secondary | ICD-10-CM

## 2020-05-09 MED ORDER — AMOXICILLIN-POT CLAVULANATE 875-125 MG PO TABS: 1.0000 | ORAL_TABLET | Freq: Two times a day (BID) | ORAL | 0 refills | Status: DC

## 2020-05-09 MED ORDER — TRIAMCINOLONE ACETONIDE 55 MCG/ACT NA AERO: 2.0000 | INHALATION_SPRAY | Freq: Every day | NASAL | 12 refills | Status: DC

## 2020-05-09 NOTE — Patient Instructions (Signed)
Instructions sent to MyChart

## 2020-05-09 NOTE — Progress Notes (Signed)
I have discussed the procedure for the virtual visit with the patient who has given consent to proceed with assessment and treatment.   Sennie Borden S Lamarr Feenstra, CMA     

## 2020-05-09 NOTE — Progress Notes (Signed)
   Virtual Visit via Video   I connected with patient on 05/09/20 at  3:30 PM EST by a video enabled telemedicine application and verified that I am speaking with the correct person using two identifiers.  Location patient: Home Location provider: [facility], Office Persons participating in the virtual visit: Patient, Provider, Haena (Patina Moore)  I discussed the limitations of evaluation and management by telemedicine and the availability of in person appointments. The patient expressed understanding and agreed to proceed.  Subjective:   HPI:   Patient presents via [place] today c/o 2+ weeks of URI symptoms including nasal congestion, sinus pressure and now sinus pain/maxillary pain. Denies fever, chills, aches, chest congestion, cough, SOB. Denies change in taste or smell. Denies GI symptoms. Denies recent travel. Has been hydrating and drinking tea with lemon and honey. Brother-in-law was sick at Thanksgiving -- better now and negative for COVID. No other sick contact.  ROS:   See pertinent positives and negatives per HPI.  Patient Active Problem List   Diagnosis Date Noted  . Hyperlipidemia 03/25/2018  . Vitamin D deficiency 03/19/2017  . Status post arthroscopy of left knee 03/07/2017  . OSA (obstructive sleep apnea) 04/17/2016  . Polyarthralgia 12/29/2015  . Physical exam 11/02/2015  . Primary cancer of upper outer quadrant of right breast ([place]) 10/22/2014  . Palpitations 12/25/2012    Social History   Tobacco Use  . Smoking status: Former Smoker    Packs/day: 2.00    Years: 6.00    Pack years: 12.00    Quit date: 12/16/1977    Years since quitting: 42.4  . Smokeless tobacco: Never Used  Substance Use Topics  . Alcohol use: Yes    Alcohol/week: 4.0 - 5.0 standard drinks    Types: 4 - 5 Glasses of wine per week    Comment: red wine daily    Current Outpatient Medications:  .  anastrozole (ARIMIDEX) 1 MG tablet, TAKE 1 TABLET BY MOUTH  DAILY, Disp: 90  tablet, Rfl: 3 .  Cholecalciferol (VITAMIN D3) 2000 units TABS, Take 2,000 Units by mouth daily., Disp: , Rfl:  .  UNABLE TO FIND, Dispense compression sleeve 20-30 mm/Hg for this pt with history of breast cancer s/p surgery., Disp: 1 each, Rfl: 0 .  Vitamin D, Ergocalciferol, (DRISDOL) 1.25 MG (50000 UNIT) CAPS capsule, Take 1 capsule (50,000 Units total) by mouth every 7 (seven) days., Disp: 12 capsule, Rfl: 0 .  vitamin E 1000 UNIT capsule, Take 1,000 Units by mouth daily., Disp: , Rfl:   No Known Allergies  Objective:   There were no vitals taken for this visit.  Patient is well-developed, well-nourished in no acute distress.  Resting comfortably at home.  Head is normocephalic, atraumatic.  No labored breathing.  Speech is clear and coherent with logical content.  Patient is alert and oriented at baseline.  + upper tooth/maxillary pain  Assessment and Plan:   1. Acute bacterial sinusitis Rx Augmentin.  Increase fluids.  Rest.  Saline nasal spray.  Probiotic.  Mucinex as directed.  Humidifier in bedroom. Rx nasacort.  Call or return to clinic if symptoms are not improving.  - amoxicillin-clavulanate (AUGMENTIN) 875-125 MG tablet; Take 1 tablet by mouth 2 (two) times daily.  Dispense: 14 tablet; Refill: 0 - triamcinolone (NASACORT) 55 MCG/ACT AERO nasal inhaler; Place 2 sprays into the nose daily.  Dispense: 1 each; Refill: [address] [place] 05/09/2020

## 2020-05-23 ENCOUNTER — Other Ambulatory Visit: Payer: 59

## 2020-05-23 DIAGNOSIS — Z20822 Contact with and (suspected) exposure to covid-19: Secondary | ICD-10-CM

## 2020-05-24 LAB — SARS-COV-2, NAA 2 DAY TAT

## 2020-05-24 LAB — NOVEL CORONAVIRUS, NAA: SARS-CoV-2, NAA: NOT DETECTED

## 2020-07-03 ENCOUNTER — Other Ambulatory Visit: Payer: Self-pay | Admitting: Family Medicine

## 2020-07-03 DIAGNOSIS — E559 Vitamin D deficiency, unspecified: Secondary | ICD-10-CM

## 2020-07-16 DIAGNOSIS — Z03818 Encounter for observation for suspected exposure to other biological agents ruled out: Secondary | ICD-10-CM | POA: Diagnosis not present

## 2020-07-20 ENCOUNTER — Ambulatory Visit: Payer: 59 | Admitting: Hematology and Oncology

## 2020-08-02 NOTE — Progress Notes (Signed)
Patient Care Team: Midge Minium, MD as PCP - General (Family Medicine) Nicholas Lose, MD as Consulting Physician (Hematology and Oncology)  DIAGNOSIS:    ICD-10-CM   1. Primary cancer of upper outer quadrant of right breast ([place])  C50.411     SUMMARY OF ONCOLOGIC HISTORY: Oncology History  Primary cancer of upper outer quadrant of right breast ([place])  09/30/2014 Initial Diagnosis   Right breast invasive ductal carcinoma grade 1, Ki-67 5%, ER positive, PR positive, HER-2 negative, 1.4 cm mass at 11:30 position no lymph nodes by ultrasound T1 cN0 M0 stage IA clinical stage   11/02/2014 Surgery   Right lumpectomy: Invasive ductal carcinoma with extracellular mucin, grade 2/3, 1.8 cm, 0/2 lymph nodes negative, ER 98%, PR 63%, HER-2 negative ratio 1.58, Ki-67 7%, Oncotype DX score 12, 8% risk of recurrence, T1 cN0 stage IA   12/28/2014 - 01/27/2015 Radiation Therapy   Adjuvant radiation therapy with boost   03/01/2015 -  Anti-estrogen oral therapy   Anastrozole 1 mg daily     CHIEF COMPLIANT: Follow-up of right breast cancer on anastrozole therapy  INTERVAL HISTORY: [name] [name] is a 65 y.o. with above-mentioned history of right breast cancer treated with lumpectomy, radiation, and who is currently on anastrozole therapy.Mammogram on 11/03/19 showed no evidence of malignancy bilaterally.She presents to the clinictoday for follow-up.   She was able to finally go to [place] and see her grandchild.  She is so excited about it.  She just came back a few weeks ago.  She denies any lumps or nodules in the breast.  She is tolerating anastrozole extremely well without any problems or concerns.  ALLERGIES:  has No Known Allergies.  MEDICATIONS:  Current Outpatient Medications  Medication Sig Dispense Refill  . amoxicillin-clavulanate (AUGMENTIN) 875-125 MG tablet Take 1 tablet by mouth 2 (two) times daily. 14 tablet 0  . anastrozole (ARIMIDEX) 1 MG tablet TAKE 1 TABLET BY MOUTH  DAILY 90  tablet 3  . Cholecalciferol (VITAMIN D3) 2000 units TABS Take 2,000 Units by mouth daily.    Marland Kitchen triamcinolone (NASACORT) 55 MCG/ACT AERO nasal inhaler Place 2 sprays into the nose daily. 1 each 12  . UNABLE TO FIND Dispense compression sleeve 20-30 mm/Hg for this pt with history of breast cancer s/p surgery. 1 each 0  . Vitamin D, Ergocalciferol, (DRISDOL) 1.25 MG (50000 UNIT) CAPS capsule TAKE 1 CAPSULE BY MOUTH  EVERY 7 DAYS 12 capsule 3  . vitamin E 1000 UNIT capsule Take 1,000 Units by mouth daily.     No current facility-administered medications for this visit.    PHYSICAL EXAMINATION: ECOG PERFORMANCE STATUS: 1 - Symptomatic but completely ambulatory  Vitals:   08/03/20 0918  BP: (!) 142/88  Pulse: 81  Resp: 18  Temp: 97.6 F (36.4 C)  SpO2: 100%   Filed Weights   08/03/20 0918  Weight: 171 lb 11.2 oz (77.9 kg)    BREAST: No palpable masses or nodules in either right or left breasts. No palpable axillary supraclavicular or infraclavicular adenopathy no breast tenderness or nipple discharge. (exam performed in the presence of a chaperone)  LABORATORY DATA:  I have reviewed the data as listed CMP Latest Ref Rng & Units 04/01/2020 07/28/2019 03/31/2019  Glucose 70 - 99 mg/dL 98 - 90  BUN 6 - 23 mg/dL 17 - 16  Creatinine 0.40 - 1.20 mg/dL 0.75 - 0.70  Sodium 135 - 145 mEq/L 141 - 138  Potassium 3.5 - 5.1 mEq/L 4.9 - 4.3  Chloride 96 - 112 mEq/L 104 - 101  CO2 19 - 32 mEq/L 32 - 30  Calcium 8.4 - 10.5 mg/dL 9.4 - 9.5  Total Protein 6.0 - 8.3 g/dL 6.6 6.5 6.7  Total Bilirubin 0.2 - 1.2 mg/dL 0.7 0.5 0.6  Alkaline Phos 39 - 117 U/L 55 62 59  AST 0 - 37 U/L _0 ALT 0 - 35 U/L _1 Lab Results  Component Value Date   WBC 4.5 04/01/2020   HGB 13.9 04/01/2020   HCT 41.8 04/01/2020   MCV 95.6 04/01/2020   PLT 199.0 04/01/2020   NEUTROABS 2.1 04/01/2020    ASSESSMENT & PLAN:  Primary cancer of upper outer quadrant of right breast Right lumpectomy  11/02/2014: Invasive ductal carcinoma with extracellular mucin, grade 2/3, 1.8 cm, 0/2 lymph nodes negative, ER 98%, PR 63%, HER-2 negative ratio 1.58, Ki-67 7%, Oncotype DX score 12, 8% risk of recurrence, T1 cN0 stage IA status post radiation completed 01/27/2015  Current treatment: Anastrozole adjuvant therapy 1 mg daily 5 years started October 2016 Anastrozole toxicities:  1. Hot flashesHave resolved since she started taking half a tablet twice a day  Patient was unable to see her grandchild in [place] because of COVID-19.   Breast Cancer Surveillance: 1. Breast exam3/9/2022tenderness in the right breast:Benign 2. Mammogram6/12/2019 at [facility] benign. Postsurgical changes.Breast density category C  Return to clinic in 1 year for follow-up     No orders of the defined types were placed in this encounter.  The patient has a good understanding of the overall plan. she agrees with it. she will call with any problems that may develop before the next visit here.  Total time spent: 20 mins including face to face time and time spent for planning, charting and coordination of care  Rulon Eisenmenger, MD, MPH 08/03/2020  I, Cloyde Reams Dorshimer, am acting as scribe for Dr. Nicholas Lose.  I have reviewed the above documentation for accuracy and completeness, and I agree with the above.

## 2020-08-03 ENCOUNTER — Telehealth: Payer: Self-pay | Admitting: Hematology and Oncology

## 2020-08-03 ENCOUNTER — Telehealth: Payer: Medicare Other | Admitting: Hematology and Oncology

## 2020-08-03 ENCOUNTER — Other Ambulatory Visit: Payer: Self-pay

## 2020-08-03 ENCOUNTER — Inpatient Hospital Stay: Payer: Medicare Other | Attending: Hematology and Oncology | Admitting: Hematology and Oncology

## 2020-08-03 DIAGNOSIS — Z170 Estrogen receptor positive status [ER+]: Secondary | ICD-10-CM | POA: Insufficient documentation

## 2020-08-03 DIAGNOSIS — Z923 Personal history of irradiation: Secondary | ICD-10-CM | POA: Insufficient documentation

## 2020-08-03 DIAGNOSIS — C50411 Malignant neoplasm of upper-outer quadrant of right female breast: Secondary | ICD-10-CM | POA: Insufficient documentation

## 2020-08-03 MED ORDER — ANASTROZOLE 1 MG PO TABS: 1.0000 mg | ORAL_TABLET | Freq: Every day | ORAL | 3 refills | Status: DC

## 2020-08-03 NOTE — Assessment & Plan Note (Signed)
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 Hot flashesHave resolved since she started taking half a tablet twice a day  Patient was unable to see her grandchild in [place] because of COVID-19.   Breast Cancer Surveillance: 1. Breast exam3/9/2022tenderness in the right breast:Benign 2. Mammogram6/12/2019 at [facility] benign. Postsurgical changes.Breast density category C  Return to clinic in 1 year for follow-up

## 2020-08-03 NOTE — Telephone Encounter (Signed)
Scheduled appts per 3/9 los. Gave pt a print out of AVS.  

## 2020-09-13 ENCOUNTER — Ambulatory Visit: Payer: Medicare Other | Admitting: Pulmonary Disease

## 2020-09-13 ENCOUNTER — Other Ambulatory Visit: Payer: Self-pay

## 2020-09-13 ENCOUNTER — Encounter: Payer: Self-pay | Admitting: Pulmonary Disease

## 2020-09-13 VITALS — BP 128/82 | HR 82 | Temp 97.6°F | Ht 68.0 in | Wt 175.8 lb

## 2020-09-13 DIAGNOSIS — G4733 Obstructive sleep apnea (adult) (pediatric): Secondary | ICD-10-CM | POA: Diagnosis not present

## 2020-09-13 NOTE — Progress Notes (Signed)
[facility]  Chief Complaint  Patient presents with  . Follow-up    Using CPAP regularly, feels like there is not enough air coming out at beginning of sleep    Constitutional:  BP 128/82 (BP Location: Right Arm, Patient Position: Sitting, Cuff Size: Normal)   Pulse 82   Temp 97.6 F (36.4 C) (Skin)   Ht 5\' 8"  (1.727 m)   Wt 175 lb 12.8 oz (79.7 kg)   SpO2 97%   BMI 26.73 kg/m   Past Medical History:  Breast cancer 2016, SVT  Past Surgical History:  She  has a past surgical history that includes Stapedectomy (Right); Abdominal hysterectomy (1999); SVT ablation (2000); Knee cartilage surgery; Breast lumpectomy with radioactive seed and sentinel lymph node biopsy (Right, 11/02/2014); Lipoma excision (N/A, 03/23/2015); and Colonoscopy.  Brief Summary:  [name] [name] is a 65 y.o. female former smoker with obstructive sleep apnea.      Subjective:   Last seen in 2019.    She uses CPAP nightly.  Has noticed more trouble with dry mouth, and mask leak.  Feels like the pressure runs too low at times.  She had her machine checked by [facility] and was told it is functioning okay.  She has nasal mask.  She has a chin strap, but this is a nuisance to put back on after she uses the restroom during the night.  Physical Exam:   Appearance - well kempt   ENMT - no sinus tenderness, no oral exudate, no LAN, Mallampati 3 airway, no stridor  Respiratory - equal breath sounds bilaterally, no wheezing or rales  CV - s1s2 regular rate and rhythm, no murmurs  Ext - no clubbing, no edema  Skin - no rashes  Psych - normal mood and affect   Sleep Tests:   HST 04/16/16 >> AHI 14.2, SaO2 low 77%  Auto CPAP 04/11/18 to 04/20/18 >> used on 10 of 10 nights average 7 hrs 6 min.  Average AHI 1.9 with median CPAP 7 and 95 th percentile CPAP 10 cm H2O.  Social History:  She  reports that she quit smoking about 42 years ago. She has a 12.00 pack-year  smoking history. She has never used smokeless tobacco. She reports current alcohol use of about 4.0 - 5.0 standard drinks of alcohol per week. She reports that she does not use drugs.  Family History:  Her family history includes Acute lymphoblastic leukemia in her paternal grandfather; Bladder Cancer in her father; CVA in her maternal grandmother and mother; Heart attack in her maternal grandfather; Leukemia in her paternal grandfather; Lung cancer in her paternal aunt, paternal uncle, and paternal uncle; Ovarian cancer in her paternal aunt; Supraventricular tachycardia in her brother, grandchild, sister, and son.     Assessment/Plan:   Obstructive sleep apnea. - she is compliant with CPAP and reports benefit from therapy - she uses [facility] for her DME - will change her auto CPAP to 7 - 10 cm H2O - discussed how she can change the ramping setting on her machine - she will look up alternative mask options on line and notify us if she finds an alternative mask she would like to try - she has tried a chin strap, but didn't like using this - she should be eligible for an new machine in late 2023  Time Spent Involved in Patient Care on Day of Examination:  22 minutes  Follow up:  Patient Instructions  Will have [facility] change  your auto CPAP to 7 - 10 cm water pressure  Can look up mask options at [email or web address] or similar website  Follow up in 1 year   Medication List:   Allergies as of 09/13/2020   No Known Allergies     Medication List       Accurate as of September 13, 2020 10:26 AM. If you have any questions, ask your nurse or doctor.        anastrozole 1 MG tablet Commonly known as: ARIMIDEX Take 1 tablet (1 mg total) by mouth daily.   Vitamin D3 50 MCG (2000 UT) Tabs Take 2,000 Units by mouth daily.   vitamin E 1000 UNIT capsule Take 1,000 Units by mouth daily.       Signature:  Chesley Mires, MD [place] Pager - [phone] 09/13/2020,  10:26 AM

## 2020-09-13 NOTE — Patient Instructions (Signed)
Will have [facility] change your auto CPAP to 7 - 10 cm water pressure  Can look up mask options at [email or web address] or similar website  Follow up in 1 year

## 2020-09-16 DIAGNOSIS — Z20822 Contact with and (suspected) exposure to covid-19: Secondary | ICD-10-CM | POA: Diagnosis not present

## 2020-10-11 DIAGNOSIS — G4733 Obstructive sleep apnea (adult) (pediatric): Secondary | ICD-10-CM | POA: Diagnosis not present

## 2020-11-03 DIAGNOSIS — G4733 Obstructive sleep apnea (adult) (pediatric): Secondary | ICD-10-CM | POA: Diagnosis not present

## 2020-11-15 DIAGNOSIS — Z1231 Encounter for screening mammogram for malignant neoplasm of breast: Secondary | ICD-10-CM | POA: Diagnosis not present

## 2020-11-15 LAB — HM MAMMOGRAPHY

## 2020-11-23 ENCOUNTER — Encounter: Payer: Self-pay | Admitting: *Deleted

## 2020-11-26 DIAGNOSIS — R21 Rash and other nonspecific skin eruption: Secondary | ICD-10-CM | POA: Diagnosis not present

## 2020-12-08 ENCOUNTER — Encounter (HOSPITAL_BASED_OUTPATIENT_CLINIC_OR_DEPARTMENT_OTHER): Payer: Self-pay | Admitting: Cardiovascular Disease

## 2020-12-08 ENCOUNTER — Ambulatory Visit: Payer: Medicare Other | Admitting: Family Medicine

## 2020-12-08 ENCOUNTER — Ambulatory Visit (INDEPENDENT_AMBULATORY_CARE_PROVIDER_SITE_OTHER): Payer: Medicare Other

## 2020-12-08 ENCOUNTER — Ambulatory Visit (HOSPITAL_BASED_OUTPATIENT_CLINIC_OR_DEPARTMENT_OTHER): Payer: Medicare Other | Admitting: Cardiovascular Disease

## 2020-12-08 ENCOUNTER — Other Ambulatory Visit: Payer: Self-pay

## 2020-12-08 DIAGNOSIS — R002 Palpitations: Secondary | ICD-10-CM

## 2020-12-08 DIAGNOSIS — G4733 Obstructive sleep apnea (adult) (pediatric): Secondary | ICD-10-CM

## 2020-12-08 DIAGNOSIS — E7800 Pure hypercholesterolemia, unspecified: Secondary | ICD-10-CM

## 2020-12-08 NOTE — Progress Notes (Signed)
Cardiology Office Note:    Date:  12/08/2020   ID:  [name] [name], DOB 22-Oct-1955, MRN [id number]  PCP:  [name] Minium, MD   [facility] Providers Cardiologist:  None     Referring MD: [name] Minium, MD   No chief complaint on file.   History of Present Illness:    [name] [name] is a 65 y.o. female with a hx of right breast cancer s/p radiation (01/2015), OSA, paroxysmal SVT s/p RFA ablation (2004), here to establish care for SVT.  Today, she has been feeling well until a few months ago. She has been experiencing some intermittent heart flutters with associated lightheadedness, but she does not call them palpitations. She would describe it as her heart is not beating right, a "sloppy" heartbeat. They are fairly constant throughout every day, and typically last for a few seconds at a time. She notes it feels like she is having an adrenaline rush. When these episodes first developed, she had awoken in the morning after drinking red wine the previous night. She felt like "her heart is pumping out of her throat." Since then, she has systematically eliminated red wine, coffee, and sugar in her diet to check for triggers, but she continues to have these heart flutter episodes. Of note, two weeks ago, she was bitten by an insect and had a reaction that spread up her right UE. She was given prednisone to take for a week, and noticed worsening heart flutters due to the medication. For exercise, she typically swims a few laps in the pool every morning. For her diet, she avoids fried foods and cooks her meals at home. Normally she eats lean meats and salads. When she was 65 yo, she quit smoking. Her 10-year risk score is 4.7%. She denies any chest pain, shortness of breath, or exertional symptoms. No headaches, or syncope to report. Also has no lower extremity edema, orthopnea or PND.   Past Medical History:  Diagnosis Date   Cancer [facility]) 2016   right breast cancer   Cancer of right  breast ([place])    last radiation was 01/2015   Dysrhythmia    no problems since ablation 2004   Lipoma of abdominal wall    OSA (obstructive sleep apnea) 04/17/2016   Paroxysmal SVT (supraventricular tachycardia) ([name] [name]) 2004   Status post RFA ABLATION   S/P radiation therapy 12/28/2014 through 01/27/2015                                                     Right breast 4250 cGy 17 sessions, right breast boost 1200 cGy in 6 sessions                          Past Surgical History:  Procedure Laterality Date   ABDOMINAL HYSTERECTOMY  1999   WITH BLADDER REPAIR FOR PROPLAPSE-HAS OVARIES (NO CA)   BREAST LUMPECTOMY WITH RADIOACTIVE SEED AND SENTINEL LYMPH NODE BIOPSY Right 11/02/2014   Procedure: RIGHT BREAST LUMPECTOMY WITH RADIOACTIVE SEED AND RIGHT AXILLARY SENTINEL LYMPH NODE BIOPSY;  Surgeon: Excell Seltzer, MD;  Location: [place];  Service: General;  Laterality: Right;   COLONOSCOPY     KNEE CARTILAGE SURGERY     LIPOMA EXCISION N/A 03/23/2015   Procedure: EXCISION LIPOMA ABDOMINAL WALLL;  Surgeon: Excell Seltzer,  MD;  Location: [place];  Service: General;  Laterality: N/A;   STAPEDECTOMY Right    SVT ablation  2000    Current Medications: Current Meds  Medication Sig   anastrozole (ARIMIDEX) 1 MG tablet Take 1 tablet (1 mg total) by mouth daily.   Cholecalciferol (VITAMIN D3) 2000 units TABS Take 2,000 Units by mouth daily.   vitamin E 1000 UNIT capsule Take 1,000 Units by mouth daily.     Allergies:   Patient has no known allergies.   Social History   Socioeconomic History   Marital status: Married    Spouse name: Not on file   Number of children: Not on file   Years of education: Not on file   Highest education level: Not on file  Occupational History   Not on file  Tobacco Use   Smoking status: Former    Packs/day: 2.00    Years: 6.00    Pack years: 12.00    Types: Cigarettes    Quit date: 12/16/1977    Years since quitting:  43.0   Smokeless tobacco: Never  Vaping Use   Vaping Use: Never used  Substance and Sexual Activity   Alcohol use: Yes    Alcohol/week: 4.0 - 5.0 standard drinks    Types: 4 - 5 Glasses of wine per week    Comment: red wine daily   Drug use: No   Sexual activity: Yes    Birth control/protection: Surgical  Other Topics Concern   Not on file  Social History Narrative   She is married to [employer]. They have 2 children, and one son died at age 38 with complications of an arrhythmia -- presumably yet record aorta.   Is a former smoker -- quit over 30 years ago. She maybe has one or 2 vessel [place] periodically. Currently unemployed. She recently returned related to narcotic from [facility].   Social Determinants of Health   Financial Resource Strain: Not on file  Food Insecurity: Not on file  Transportation Needs: Not on file  Physical Activity: Not on file  Stress: Not on file  Social Connections: Not on file     Family History: The patient's family history includes Acute lymphoblastic leukemia in her paternal grandfather; Bladder Cancer in her father; CVA in her maternal grandmother and mother; Heart attack in her maternal grandfather; Leukemia in her paternal grandfather; Lung cancer in her paternal aunt, paternal uncle, and paternal uncle; Ovarian cancer in her paternal aunt; Supraventricular tachycardia in her brother, grandchild, sister, and son.  ROS:   Please see the history of present illness.    (+) Heart flutters (+) Lightheadedness All other systems reviewed and are negative.  EKGs/Labs/Other Studies Reviewed:    The following studies were reviewed today: No prior CV studies available.  EKG:   12/08/2020: Sinus rhythm. Rate 62 bpm.  Recent Labs: 04/01/2020: ALT 12; BUN 17; Creatinine, Ser 0.75; Hemoglobin 13.9; Platelets 199.0; Potassium 4.9; Sodium 141; TSH 1.81  Recent Lipid Panel    Component Value Date/Time   CHOL 229 (H) 04/01/2020 0939   TRIG 133.0  04/01/2020 0939   HDL 80.70 04/01/2020 0939   CHOLHDL 3 04/01/2020 0939   VLDL 26.6 04/01/2020 0939   LDLCALC 121 (H) 04/01/2020 0939    Physical Exam:    VS:  BP 114/68   Pulse 62   Ht 5\' 8"  (1.727 m)   Wt 177 lb 6.4 oz (80.5 kg)   SpO2 98%   BMI 26.97  kg/m  , BMI Body mass index is 26.97 kg/m. GENERAL:  Well appearing HEENT: Pupils equal round and reactive, fundi not visualized, oral mucosa unremarkable NECK:  No jugular venous distention, waveform within normal limits, carotid upstroke brisk and symmetric, no bruits LUNGS:  Clear to auscultation bilaterally HEART:  RRR.  PMI not displaced or sustained,S1 and S2 within normal limits, no S3, no S4, no clicks, no rubs, no murmurs ABD:  Flat, positive bowel sounds normal in frequency in pitch, no bruits, no rebound, no guarding, no midline pulsatile mass, no hepatomegaly, no splenomegaly EXT:  2 plus pulses throughout, no edema, no cyanosis no clubbing SKIN:  No rashes no nodules NEURO:  Cranial nerves II through XII grossly intact, motor grossly intact throughout PSYCH:  Cognitively intact, oriented to person place and time   ASSESSMENT:    1. Palpitations   2. Pure hypercholesterolemia   3. OSA (obstructive sleep apnea)    PLAN:   Palpitations She is having palpitations that sound like PACs or PVCs.  However there is some associated lightheadedness and she does have a history of SVT.  We will check a CBC, CMP, magnesium, and TSH.  We will also get a 3-day ZIO to assess.  She has already limited alcohol and caffeine intake.  Hyperlipidemia LDL is elevated, however ASCVD 10-year risk is 4.1% and her HDL is 80.  Continue working on diet and exercise for now.  OSA (obstructive sleep apnea) Continue CPAP use.  In order of problems listed above:  Disposition: FU with APP in 4-6 weeks. FU with Tamee Battin C. Oval Linsey, MD, [facility] in 4 months.   Medication Adjustments/Labs and Tests Ordered: Current medicines are reviewed at  length with the patient today.  Concerns regarding medicines are outlined above.  Orders Placed This Encounter  Procedures   CBC with Differential/Platelet   T4, free   TSH   Magnesium   Comprehensive metabolic panel   LONG TERM MONITOR (3-14 DAYS)   EKG 12-Lead   No orders of the defined types were placed in this encounter.   Patient Instructions  Medication Instructions:  Your physician recommends that you continue on your current medications as directed. Please refer to the Current Medication list given to you today.   *If you need a refill on your cardiac medications before your next appointment, please call your pharmacy*  Lab Work: MAGNESIUM/CBC/TSH/FT4/CMET SOON   If you have labs (blood work) drawn today and your tests are completely normal, you will receive your results only by: [place] (if you have MyChart) OR A paper copy in the mail If you have any lab test that is abnormal or we need to change your treatment, we will call you to review the results.  Testing/Procedures: 3 DAY ZIO PLACED TODAY   Follow-Up: At [facility], you and your health needs are our priority.  As part of our continuing mission to provide you with exceptional heart care, we have created designated Provider Care Teams.  These Care Teams include your primary Cardiologist (physician) and Advanced Practice Providers (APPs -  Physician Assistants and Nurse Practitioners) who all work together to provide you with the care you need, when you need it.  We recommend signing up for the patient portal called "MyChart".  Sign up information is provided on this After Visit Summary.  MyChart is used to connect with patients for Virtual Visits (Telemedicine).  Patients are able to view lab/test results, encounter notes, upcoming appointments, etc.  Non-urgent messages can be  sent to your provider as well.   To learn more about what you can do with MyChart, go to [email or web address].    Your next  appointment:   4 month(s)  The format for your next appointment:   In Person  Provider:   Skeet Latch, MD  Your physician recommends that you schedule a follow-up appointment in: [place] NP      I,Mathew Stumpf,acting as a scribe for Skeet Latch, MD.,have documented all relevant documentation on the behalf of Skeet Latch, MD,as directed by  Skeet Latch, MD while in the presence of Skeet Latch, MD.  I, [place] Oval Linsey, MD have reviewed all documentation for this visit.  The documentation of the exam, diagnosis, procedures, and orders on 12/08/2020 are all accurate and complete.  Signed, Skeet Latch, MD  12/08/2020 8:34 AM    [place]

## 2020-12-08 NOTE — Assessment & Plan Note (Signed)
Continue CPAP use

## 2020-12-08 NOTE — Assessment & Plan Note (Addendum)
She is having palpitations that sound like PACs or PVCs.  However there is some associated lightheadedness and she does have a history of SVT.  We will check a CBC, CMP, magnesium, and TSH.  We will also get a 3-day ZIO to assess.  She has already limited alcohol and caffeine intake.

## 2020-12-08 NOTE — Patient Instructions (Signed)
Medication Instructions:  Your physician recommends that you continue on your current medications as directed. Please refer to the Current Medication list given to you today.   *If you need a refill on your cardiac medications before your next appointment, please call your pharmacy*  Lab Work: MAGNESIUM/CBC/TSH/FT4/CMET SOON   If you have labs (blood work) drawn today and your tests are completely normal, you will receive your results only by: [place] (if you have MyChart) OR A paper copy in the mail If you have any lab test that is abnormal or we need to change your treatment, we will call you to review the results.  Testing/Procedures: 3 DAY ZIO PLACED TODAY   Follow-Up: At [facility], you and your health needs are our priority.  As part of our continuing mission to provide you with exceptional heart care, we have created designated Provider Care Teams.  These Care Teams include your primary Cardiologist (physician) and Advanced Practice Providers (APPs -  Physician Assistants and Nurse Practitioners) who all work together to provide you with the care you need, when you need it.  We recommend signing up for the patient portal called "MyChart".  Sign up information is provided on this After Visit Summary.  MyChart is used to connect with patients for Virtual Visits (Telemedicine).  Patients are able to view lab/test results, encounter notes, upcoming appointments, etc.  Non-urgent messages can be sent to your provider as well.   To learn more about what you can do with MyChart, go to [email or web address].    Your next appointment:   4 month(s)  The format for your next appointment:   In Person  Provider:   Skeet Latch, MD  Your physician recommends that you schedule a follow-up appointment in: [place] NP

## 2020-12-08 NOTE — Assessment & Plan Note (Signed)
LDL is elevated, however ASCVD 10-year risk is 4.1% and her HDL is 80.  Continue working on diet and exercise for now.

## 2020-12-11 DIAGNOSIS — R002 Palpitations: Secondary | ICD-10-CM

## 2020-12-12 DIAGNOSIS — R002 Palpitations: Secondary | ICD-10-CM | POA: Diagnosis not present

## 2020-12-13 LAB — COMPREHENSIVE METABOLIC PANEL
ALT: 16 IU/L (ref 0–32)
AST: 13 IU/L (ref 0–40)
Albumin/Globulin Ratio: 1.8 (ref 1.2–2.2)
Albumin: 4.4 g/dL (ref 3.8–4.8)
Alkaline Phosphatase: 64 IU/L (ref 44–121)
BUN/Creatinine Ratio: 21 (ref 12–28)
BUN: 18 mg/dL (ref 8–27)
Bilirubin Total: 0.6 mg/dL (ref 0.0–1.2)
CO2: 25 mmol/L (ref 20–29)
Calcium: 9.7 mg/dL (ref 8.7–10.3)
Chloride: 103 mmol/L (ref 96–106)
Creatinine, Ser: 0.84 mg/dL (ref 0.57–1.00)
Globulin, Total: 2.4 g/dL (ref 1.5–4.5)
Glucose: 102 mg/dL — ABNORMAL HIGH (ref 65–99)
Potassium: 5 mmol/L (ref 3.5–5.2)
Sodium: 141 mmol/L (ref 134–144)
Total Protein: 6.8 g/dL (ref 6.0–8.5)
eGFR: 77 mL/min/{1.73_m2} (ref >59)

## 2020-12-13 LAB — CBC WITH DIFFERENTIAL/PLATELET
Basophils Absolute: 0 10*3/uL (ref 0.0–0.2)
Basos: 1 %
EOS (ABSOLUTE): 0.3 10*3/uL (ref 0.0–0.4)
Eos: 5 %
Hematocrit: 41.9 % (ref 34.0–46.6)
Hemoglobin: 13.9 g/dL (ref 11.1–15.9)
Immature Grans (Abs): 0 10*3/uL (ref 0.0–0.1)
Immature Granulocytes: 0 %
Lymphocytes Absolute: 2 10*3/uL (ref 0.7–3.1)
Lymphs: 36 %
MCH: 31.7 pg (ref 26.6–33.0)
MCHC: 33.2 g/dL (ref 31.5–35.7)
MCV: 95 fL (ref 79–97)
Monocytes Absolute: 0.7 10*3/uL (ref 0.1–0.9)
Monocytes: 12 %
Neutrophils Absolute: 2.6 10*3/uL (ref 1.4–7.0)
Neutrophils: 46 %
Platelets: 203 10*3/uL (ref 150–450)
RBC: 4.39 x10E6/uL (ref 3.77–5.28)
RDW: 12.7 % (ref 11.7–15.4)
WBC: 5.6 10*3/uL (ref 3.4–10.8)

## 2020-12-13 LAB — TSH: TSH: 2.33 u[IU]/mL (ref 0.450–4.500)

## 2020-12-13 LAB — T4, FREE: Free T4: 1.08 ng/dL (ref 0.82–1.77)

## 2020-12-13 LAB — MAGNESIUM: Magnesium: 2 mg/dL (ref 1.6–2.3)

## 2020-12-15 DIAGNOSIS — R002 Palpitations: Secondary | ICD-10-CM | POA: Diagnosis not present

## 2021-01-18 ENCOUNTER — Other Ambulatory Visit: Payer: Self-pay

## 2021-01-18 ENCOUNTER — Encounter (HOSPITAL_BASED_OUTPATIENT_CLINIC_OR_DEPARTMENT_OTHER): Payer: Self-pay | Admitting: Family

## 2021-01-18 ENCOUNTER — Ambulatory Visit (HOSPITAL_BASED_OUTPATIENT_CLINIC_OR_DEPARTMENT_OTHER): Payer: Medicare Other | Admitting: Family

## 2021-01-18 VITALS — BP 100/60 | HR 81 | Ht 68.0 in | Wt 175.0 lb

## 2021-01-18 DIAGNOSIS — I471 Supraventricular tachycardia, unspecified: Secondary | ICD-10-CM

## 2021-01-18 DIAGNOSIS — R002 Palpitations: Secondary | ICD-10-CM

## 2021-01-18 MED ORDER — METOPROLOL TARTRATE 25 MG PO TABS: 25.0000 mg | ORAL_TABLET | ORAL | 2 refills | Status: DC | PRN

## 2021-01-18 NOTE — Patient Instructions (Addendum)
Medication Instructions:  Your physician has recommended you make the following change in your medication:   START Metoprolol Tartrate half or one tablet as needed for palpitations that are persistent  *If you need a refill on your cardiac medications before your next appointment, please call your pharmacy*  Lab Work: None ordered today  Testing/Procedures: None ordered today  Follow-Up: At [facility], you and your health needs are our priority.  As part of our continuing mission to provide you with exceptional heart care, we have created designated Provider Care Teams.  These Care Teams include your primary Cardiologist (physician) and Advanced Practice Providers (APPs -  Physician Assistants and Nurse Practitioners) who all work together to provide you with the care you need, when you need it.  We recommend signing up for the patient portal called "MyChart".  Sign up information is provided on this After Visit Summary.  MyChart is used to connect with patients for Virtual Visits (Telemedicine).  Patients are able to view lab/test results, encounter notes, upcoming appointments, etc.  Non-urgent messages can be sent to your provider as well.   To learn more about what you can do with MyChart, go to [email or web address].    Your next appointment:   You have been referred to electrophysiology  AND As scheduled with Dr. Oval Linsey   Other Instructions  To prevent palpitations: Make sure you are adequately hydrated.  Avoid and/or limit caffeine containing beverages like soda or tea. Exercise regularly.  Manage stress well. Some over the counter medications can cause palpitations such as Benadryl, AdvilPM, TylenolPM. Regular Advil or Tylenol do not cause palpitations.    Heart Healthy Diet Recommendations: A low-salt diet is recommended. Meats should be grilled, baked, or boiled. Avoid fried foods. Focus on lean protein sources like fish or chicken with vegetables and fruits. The  American Heart Association is a Microbiologist!   Exercise recommendations: The American Heart Association recommends 150 minutes of moderate intensity exercise weekly. Try 30 minutes of moderate intensity exercise 4-5 times per week. This could include walking, jogging, or swimming.

## 2021-01-18 NOTE — Progress Notes (Signed)
Office Visit    Patient Name: [name] [name] Date of Encounter: 01/18/2021  PCP:  Midge Minium, MD   [place]  Cardiologist:  Skeet Latch, MD  Advanced Practice Provider:  No care team member to display Electrophysiologist:  None    Chief Complaint    [name] [name] is a 65 y.o. female with a hx of right breast cancer s/p radiation in September 2016, OSA, paroxysmal SVT s/p RFA ablation (2004) presents today for follow-up after ZIO monitor  Past Medical History    Past Medical History:  Diagnosis Date   Cancer [facility]) 2016   right breast cancer   Cancer of right breast ([place])    last radiation was 01/2015   Dysrhythmia    no problems since ablation 2004   Lipoma of abdominal wall    OSA (obstructive sleep apnea) 04/17/2016   Paroxysmal SVT (supraventricular tachycardia) ([place]) 2004   Status post RFA ABLATION   S/P radiation therapy 12/28/2014 through 01/27/2015                                                     Right breast 4250 cGy 17 sessions, right breast boost 1200 cGy in 6 sessions                         Past Surgical History:  Procedure Laterality Date   [place] (NO CA)   BREAST LUMPECTOMY WITH RADIOACTIVE SEED AND SENTINEL LYMPH NODE BIOPSY Right 11/02/2014   Procedure: RIGHT BREAST LUMPECTOMY WITH RADIOACTIVE SEED AND RIGHT AXILLARY SENTINEL LYMPH NODE BIOPSY;  Surgeon: Excell Seltzer, MD;  Location: [place];  Service: General;  Laterality: Right;   COLONOSCOPY     KNEE CARTILAGE SURGERY     LIPOMA EXCISION N/A 03/23/2015   Procedure: [place] WALLL;  Surgeon: Excell Seltzer, MD;  Location: [place];  Service: General;  Laterality: N/A;   STAPEDECTOMY Right    SVT ablation  2000    Allergies  No Known Allergies  History of Present Illness    [name] [name] is a 65 y.o. female with a hx of right  breast cancer s/p radiation in September 2016, OSA, paroxysmal SVT s/p RFA ablation (2004) last seen 12/08/20 by Dr. Oval Linsey.  She was seen in consult by Dr. Oval Linsey 12/08/2020 to establish care for SVT.  She was feeling overall well until a few months ago when she began noticing intermittent heart flutters with associated lightheadedness.  She described them as a "sloppy "heartbeat and they were consistent throughout the day.  She was were exercising regularly by swimming and following a heart healthy diet.  She wore a 3-day ZIO monitor that showed predominantly normal sinus rhythm with average heart rate of 71 bpm with several runs of SVT up to 4 beats with maximum heart rate SVT and 200 bpm.  Rare PACs and PVCs.  She presents today for follow-up after ZIO monitor.  We reviewed in clinic and also Dr. Daisey Must recommendation for EP evaluation.  Tells me her previous SVT I will ablation was performed in [place].  She moved to the [place] as her husband is from here.  Tells me she does have a young granddaughter who  is 35 years old in [place] who she enjoys visiting. Tells me this feels different than her previous episodes of SVT and not as severe. Notes her heart is still "flipping and flopping" once per day. Feels like a heart beat or a short pause and heart - it does not last very long and self resolves. Notes she has eliminate caffeine with no improvement in symptoms. She does note red wine triggers palpitations and has reduced intake. .   EKGs/Labs/Other Studies Reviewed:   The following studies were reviewed today:  ZIO 11/2020 3 Day Zio Monitor   Quality: Fair.  Baseline artifact. Predominant rhythm: sinus rhythm Average heart rate: 71 bpm Max heart rate: 139 bpm Min heart rate: 47 bpm   7 runs of SVT up to 4 beat.  Maximum heart rate in SVT 200 bpm. Rare PVCs and PACs.    EKG:  No EKG today  Recent Labs: 12/12/2020: ALT 16; BUN 18; Creatinine, Ser 0.84; Hemoglobin 13.9;  Magnesium 2.0; Platelets 203; Potassium 5.0; Sodium 141; TSH 2.330  Recent Lipid Panel    Component Value Date/Time   CHOL 229 (H) 04/01/2020 0939   TRIG 133.0 04/01/2020 0939   HDL 80.70 04/01/2020 0939   CHOLHDL 3 04/01/2020 0939   VLDL 26.6 04/01/2020 0939   LDLCALC 121 (H) 04/01/2020 0939   Home Medications   Current Meds  Medication Sig   anastrozole (ARIMIDEX) 1 MG tablet Take 1 tablet (1 mg total) by mouth daily.   Cholecalciferol (VITAMIN D3) 2000 units TABS Take 2,000 Units by mouth daily.   metoprolol tartrate (LOPRESSOR) 25 MG tablet Take 1 tablet (25 mg total) by mouth as needed (Take half or whole tablet as needed for palpitations.).   vitamin E 1000 UNIT capsule Take 1,000 Units by mouth daily.     Review of Systems      All other systems reviewed and are otherwise negative except as noted above.  Physical Exam    VS:  BP 100/60 (BP Location: Left Arm, Patient Position: Sitting, Cuff Size: Normal)   Pulse 81   Ht '5\' 8"'$  (1.727 m)   Wt 175 lb (79.4 kg)   SpO2 97%   BMI 26.61 kg/m  , BMI Body mass index is 26.61 kg/m.  Wt Readings from Last 3 Encounters:  01/18/21 175 lb (79.4 kg)  12/08/20 177 lb 6.4 oz (80.5 kg)  09/13/20 175 lb 12.8 oz (79.7 kg)    GEN: Well nourished, well developed, in no acute distress. HEENT: normal. Neck: Supple, no JVD, carotid bruits, or masses. Cardiac: RRR, no murmurs, rubs, or gallops. No clubbing, cyanosis, edema.  Radials/PT 2+ and equal bilaterally.  Respiratory:  Respirations regular and unlabored, clear to auscultation bilaterally. GI: Soft, nontender, nondistended. MS: No deformity or atrophy. Skin: Warm and dry, no rash. Neuro:  Strength and sensation are intact. Psych: Normal affect.  Assessment & Plan    SVT / Palpitations - Previous RAF ablation 2004 in [place]. Strong family history of SVT. Recent 3-day ZIO with 7 runs of SVT up to 200bpm. Reports daily "heart fluttering" that are overall less severe than  previous SVT.  Medication management complicated by low blood pressure.  Will Rx metoprolol tartrate 12.'5mg'$ -'25mg'$  PRN for persistent palpitations and refer to EP.  Disposition: Referral placed to EP. Follow up  as scheduled in November  with Dr. Oval Linsey or APP.  Signed, Loel Dubonnet, NP 01/18/2021, 7:24 PM [place]

## 2021-02-08 DIAGNOSIS — G4733 Obstructive sleep apnea (adult) (pediatric): Secondary | ICD-10-CM | POA: Diagnosis not present

## 2021-03-01 ENCOUNTER — Other Ambulatory Visit: Payer: Self-pay

## 2021-03-01 ENCOUNTER — Ambulatory Visit (HOSPITAL_BASED_OUTPATIENT_CLINIC_OR_DEPARTMENT_OTHER): Payer: Medicare Other | Admitting: Internal Medicine

## 2021-03-01 VITALS — BP 122/76 | HR 66 | Ht 68.0 in | Wt 178.6 lb

## 2021-03-01 DIAGNOSIS — R002 Palpitations: Secondary | ICD-10-CM

## 2021-03-01 DIAGNOSIS — I471 Supraventricular tachycardia: Secondary | ICD-10-CM | POA: Diagnosis not present

## 2021-03-01 NOTE — Patient Instructions (Addendum)
Medication Instructions:  Your physician recommends that you continue on your current medications as directed. Please refer to the Current Medication list given to you today. *If you need a refill on your cardiac medications before your next appointment, please call your pharmacy*  Lab Work: None. If you have labs (blood work) drawn today and your tests are completely normal, you will receive your results only by: MyChart Message (if you have MyChart) OR A paper copy in the mail If you have any lab test that is abnormal or we need to change your treatment, we will call you to review the results.  Testing/Procedures: None.  Follow-Up: At [facility], you and your health needs are our priority.  As part of our continuing mission to provide you with exceptional heart care, we have created designated Provider Care Teams.  These Care Teams include your primary Cardiologist (physician) and Advanced Practice Providers (APPs -  Physician Assistants and Nurse Practitioners) who all work together to provide you with the care you need, when you need it.  Your physician wants you to follow-up in: 6 months with one of the following Advanced Practice Providers on your designated Care Team:    Renee Ursuy, PA-C Michael "Andy" Tillery, PA-C   You will receive a reminder letter in the mail two months in advance. If you don't receive a letter, please call our office to schedule the follow-up appointment.  We recommend signing up for the patient portal called "MyChart".  Sign up information is provided on this After Visit Summary.  MyChart is used to connect with patients for Virtual Visits (Telemedicine).  Patients are able to view lab/test results, encounter notes, upcoming appointments, etc.  Non-urgent messages can be sent to your provider as well.   To learn more about what you can do with MyChart, go to [email or web address].    Any Other Special Instructions Will Be Listed Below (If  Applicable).         

## 2021-03-01 NOTE — Progress Notes (Addendum)
Electrophysiology Office Note   Date:  03/01/2021   ID:  [name] [name], DOB [birth year], MRN [id number]  PCP:  Midge Minium, MD  Cardiologist:  Dr Oval Linsey Primary Electrophysiologist: Thompson Grayer, MD    CC: SVT   History of Present Illness: [name] [name] is a 65 y.o. female who presents today for electrophysiology evaluation.   She is referred by Dr Oval Linsey and Laurann Montana.  She has a h/o R breast Ca and is s/p radiation 2016.  She has a h/o SVT and underwent RFA in 2004. She recently presented to Dr Lucretia Field (12/08/20 note reviewed) with complaint of intermittent chest "fluttering".  Episodes lasted only seconds but recurrently throughout the day.  Her fluttering was worse in the setting of prednisone which she was taking for a week in July. She had event monitor placed 11/2020 which revealed nonsustained atrial tachycardia up to 4 beats in duration as well as PVCs and PACs.  No sustained arrhythmias were observed. She was placed on metoprolol but has not yet tried this She continues to have short episodes of palpitations.  These are more noticeable after drinking red wine.  Today, she denies symptoms of chest pain, shortness of breath, orthopnea, PND, lower extremity edema, claudication, dizziness, presyncope, syncope, bleeding, or neurologic sequela. The patient is tolerating medications without difficulties and is otherwise without complaint today.    Past Medical History:  Diagnosis Date   Cancer [facility]) 2016   right breast cancer   Cancer of right breast ([place])    last radiation was 01/2015   Dysrhythmia    no problems since ablation 2004   Lipoma of abdominal wall    OSA (obstructive sleep apnea) 04/17/2016   Paroxysmal SVT (supraventricular tachycardia) ([place]) 2004   Status post RFA ABLATION   S/P radiation therapy 12/28/2014 through 01/27/2015                                                     Right breast 4250 cGy 17 sessions, right breast boost 1200 cGy in 6  sessions                         Past Surgical History:  Procedure Laterality Date   ABDOMINAL HYSTERECTOMY  1999   WITH BLADDER REPAIR FOR PROPLAPSE-HAS OVARIES (NO CA)   BREAST LUMPECTOMY WITH RADIOACTIVE SEED AND SENTINEL LYMPH NODE BIOPSY Right 11/02/2014   Procedure: RIGHT BREAST LUMPECTOMY WITH RADIOACTIVE SEED AND RIGHT AXILLARY SENTINEL LYMPH NODE BIOPSY;  Surgeon: Excell Seltzer, MD;  Location: [place];  Service: General;  Laterality: Right;   COLONOSCOPY     KNEE CARTILAGE SURGERY     LIPOMA EXCISION N/A 03/23/2015   Procedure: [place] [facility];  Surgeon: Excell Seltzer, MD;  Location: [place];  Service: General;  Laterality: N/A;   STAPEDECTOMY Right    SVT ablation  2000     Current Outpatient Medications  Medication Sig Dispense Refill   anastrozole (ARIMIDEX) 1 MG tablet Take 1 tablet (1 mg total) by mouth daily. 90 tablet 3   Cholecalciferol (VITAMIN D3) 2000 units TABS Take 2,000 Units by mouth daily.     metoprolol tartrate (LOPRESSOR) 25 MG tablet Take 1 tablet (25 mg total) by mouth as needed (Take half or whole tablet as  needed for palpitations.). 30 tablet 2   vitamin E 1000 UNIT capsule Take 1,000 Units by mouth daily.     No current facility-administered medications for this visit.    Allergies:   Patient has no known allergies.   Social History:  The patient  reports that she quit smoking about 43 years ago. Her smoking use included cigarettes. She has a 12.00 pack-year smoking history. She has never used smokeless tobacco. She reports current alcohol use of about 4.0 - 5.0 standard drinks per week. She reports that she does not use drugs.   Family History:  The patient's family history includes Acute lymphoblastic leukemia in her paternal grandfather; Bladder Cancer in her father; CVA in her maternal grandmother and mother; Heart attack in her maternal grandfather; Leukemia in her paternal grandfather; Lung  cancer in her paternal aunt, paternal uncle, and paternal uncle; Ovarian cancer in her paternal aunt; Supraventricular tachycardia in her brother, grandchild, sister, and son.    ROS:  Please see the history of present illness.   All other systems are personally reviewed and negative.    PHYSICAL EXAM: VS:  BP 122/76   Pulse 66   Ht 5\' 8"  (1.727 m)   Wt 178 lb 9.6 oz (81 kg)   SpO2 97%   BMI 27.16 kg/m  , BMI Body mass index is 27.16 kg/m. GEN: Well nourished, well developed, in no acute distress HEENT: normal Neck: no JVD, carotid bruits, or masses Cardiac: RRR; no murmurs, rubs, or gallops,no edema  Respiratory:  clear to auscultation bilaterally, normal work of breathing GI: soft, nontender, nondistended, + BS MS: no deformity or atrophy Skin: warm and dry  Neuro:  Strength and sensation are intact Psych: euthymic mood, full affect  EKG:  EKG is ordered today. The ekg ordered today is personally reviewed and shows sinus rhythm   Recent Labs: 12/12/2020: ALT 16; BUN 18; Creatinine, Ser 0.84; Hemoglobin 13.9; Magnesium 2.0; Platelets 203; Potassium 5.0; Sodium 141; TSH 2.330  personally reviewed   Lipid Panel     Component Value Date/Time   CHOL 229 (H) 04/01/2020 0939   TRIG 133.0 04/01/2020 0939   HDL 80.70 04/01/2020 0939   CHOLHDL 3 04/01/2020 0939   VLDL 26.6 04/01/2020 0939   LDLCALC 121 (H) 04/01/2020 0939   personally reviewed   Wt Readings from Last 3 Encounters:  03/01/21 178 lb 9.6 oz (81 kg)  01/18/21 175 lb (79.4 kg)  12/08/20 177 lb 6.4 oz (80.5 kg)      Other studies personally reviewed: Additional studies/ records that were reviewed today include: event monitor, office notes  Review of the above records today demonstrates: as above   ASSESSMENT AND PLAN:  1.  Atrial tachycardia- nonsustained Likely secondary to prior chest radiation and aging.  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  She has not had  documented recurrence.  We discussed importance of lifestyle modification including regular exercise, stress reduction, and adequate sleep.  She will avoid steroids, red wine, other triggers as identified.  I do not feel that AAD or ablation would be beneficial at this time. Could consider flecainide eventually if needed. I have advised that she try using metoprolol as a prn option  Return to see EP APP in 6 months  Signed, Thompson Grayer, MD  03/01/2021 11:13 AM     [facility] [address] [place] [address] [phone] (office) [phone] (fax)

## 2021-03-07 ENCOUNTER — Encounter: Payer: Self-pay | Admitting: Gastroenterology

## 2021-03-15 ENCOUNTER — Ambulatory Visit (INDEPENDENT_AMBULATORY_CARE_PROVIDER_SITE_OTHER): Payer: Medicare Other | Admitting: Family Medicine

## 2021-03-15 ENCOUNTER — Encounter: Payer: Self-pay | Admitting: Family Medicine

## 2021-03-15 ENCOUNTER — Other Ambulatory Visit: Payer: Self-pay

## 2021-03-15 VITALS — BP 114/72 | HR 66 | Temp 98.2°F | Resp 16 | Wt 177.2 lb

## 2021-03-15 DIAGNOSIS — R21 Rash and other nonspecific skin eruption: Secondary | ICD-10-CM | POA: Diagnosis not present

## 2021-03-15 MED ORDER — HYDROXYZINE HCL 10 MG PO TABS: 10.0000 mg | ORAL_TABLET | Freq: Three times a day (TID) | ORAL | 1 refills | Status: DC | PRN

## 2021-03-15 MED ORDER — PERMETHRIN 5 % EX CREA: 1.0000 "application " | TOPICAL_CREAM | Freq: Once | CUTANEOUS | 1 refills | Status: AC

## 2021-03-15 MED ORDER — CETIRIZINE HCL 10 MG PO TABS: 10.0000 mg | ORAL_TABLET | Freq: Every day | ORAL | 11 refills | Status: DC

## 2021-03-15 NOTE — Progress Notes (Signed)
   Subjective:    Patient ID: [name] [name], female    DOB: [birth year], 65 y.o.   MRN: [id number]  HPI Rash- pt reports sxs started in July when she received her 2nd booster.  Started w/ a few bites on her R wrist,  The next morning she had 'bites everywhere'.  The 3rd day she was 'covered head to toe'.  Ended up in ER- was tx'd w/ Prednisone.  But spots will recur.  Very itchy when they occur.  No changes in detergents, foods.  No one in the house w/ similar bumps.  Does not take any regular antihistamines.  Bumps will typically last 3-4 days.  Gets areas between fingers, between toes.  None in groin.     Review of Systems For ROS see HPI   This visit occurred during the SARS-CoV-2 public health emergency.  Safety protocols were in place, including screening questions prior to the visit, additional usage of staff PPE, and extensive cleaning of exam room while observing appropriate contact time as indicated for disinfecting solutions.      Objective:   Physical Exam Vitals reviewed.  Constitutional:      General: She is not in acute distress.    Appearance: Normal appearance. She is not ill-appearing.  HENT:     Head: Normocephalic and atraumatic.  Skin:    General: Skin is warm and dry.     Coloration: Skin is not jaundiced.     Findings: Rash (maculopapular rash on hands and forearms w/ multiple scars from previous excoriations) present.  Neurological:     General: No focal deficit present.     Mental Status: She is alert and oriented to person, place, and time.  Psychiatric:        Mood and Affect: Mood normal.        Behavior: Behavior normal.          Assessment & Plan:  Rash- new.  Suspect this has been untreated scabies.  Pt reports dog has been chewing on his feet and acting very itchy.  Unusual that husband does not have similar sxs but not impossible.  She has linear bumps and areas present in the webbing of fingers and toes.  No lesions in scalp.  Will start Elimite.   Will hold off on Prednisone due to arrhythmia.  Start Hydroxyzine and daily Zyrtec.  Pt expressed understanding and is in agreement w/ plan.

## 2021-03-15 NOTE — Patient Instructions (Addendum)
Follow up as needed or as scheduled Apply the Permethrin before bed from your chin down, covering all skin, and sleep in it over night Wake up, strip sheets, get new towels, and then shower Try and avoid upholstered furniture for 2-3 days so the scabies won't survive Consider taking the dog to the vet for evaluation and treatment USE the hydroxyzine as needed for itching- up to 3x/day START daily Cetirizine (Zyrtec) to block the histamine response If no improvement, let me know and we can refer to allergy Call with any questions or concerns Hang in there!!!

## 2021-03-16 ENCOUNTER — Telehealth: Payer: Self-pay

## 2021-03-16 NOTE — Telephone Encounter (Signed)
Caller name:Jamaris Gilcrease  On DPR? :No  Call back number:[phone]  Provider they see: Birdie Riddle   Reason for call:Pt is prescribed both cetirizine (ZYRTEC) 10 MG tablet , hydrOXYzine (ATARAX/VISTARIL) 10 MG tablet  are both of these antihistamines? Also pt wanted to know if permethrin (ELIMITE) 5 % cream can she put that on her face as well since she was told to put all over body?

## 2021-03-17 NOTE — Telephone Encounter (Signed)
They are both antihistamines but different generations.  Zyrtec is used for daily control and hydroxyzine is used as needed for itching.  She can apply the cream to her face but should avoid her lips and eyes.  She does not need to apply to scalp or ears

## 2021-03-17 NOTE — Telephone Encounter (Signed)
Please advise 

## 2021-03-17 NOTE — Telephone Encounter (Signed)
Called patient to clarify concerns of the Zyrtec and the hydroxyzine. Left a message for patient to return my call.

## 2021-03-21 ENCOUNTER — Other Ambulatory Visit: Payer: Self-pay

## 2021-03-21 ENCOUNTER — Telehealth: Payer: Self-pay

## 2021-03-21 NOTE — Telephone Encounter (Signed)
Patient called in regards to the zyrtec and hydroxyzine. Patient was still confused and I informed her that per pcp the zyrtec is for daily use and the hydroxyzine is for as needed bases when itching. Patient understood. No further concerns at this time.

## 2021-03-21 NOTE — Telephone Encounter (Signed)
Error

## 2021-03-22 ENCOUNTER — Encounter: Payer: Self-pay | Admitting: Family Medicine

## 2021-03-22 ENCOUNTER — Other Ambulatory Visit: Payer: Self-pay

## 2021-03-22 DIAGNOSIS — R21 Rash and other nonspecific skin eruption: Secondary | ICD-10-CM

## 2021-03-28 ENCOUNTER — Encounter: Payer: Self-pay | Admitting: Family Medicine

## 2021-04-07 ENCOUNTER — Telehealth: Payer: Self-pay | Admitting: Gastroenterology

## 2021-04-07 NOTE — Telephone Encounter (Signed)
Returned call to patient. We have reviewed the information provided below by Dr. Loletha Carrow. Advised that we will keep recall the same. Pt verbalized understanding and had no concerns at the end of the call.

## 2021-04-07 NOTE — Telephone Encounter (Signed)
Thank you for the note.  Yes, I am aware of the patient's previous history of breast cancer.  Fortunately, that is not known to increase the risk of colorectal cancer.  - HD

## 2021-04-08 DIAGNOSIS — N390 Urinary tract infection, site not specified: Secondary | ICD-10-CM | POA: Diagnosis not present

## 2021-04-10 ENCOUNTER — Ambulatory Visit (HOSPITAL_BASED_OUTPATIENT_CLINIC_OR_DEPARTMENT_OTHER): Payer: Medicare Other | Admitting: Cardiovascular Disease

## 2021-04-13 ENCOUNTER — Ambulatory Visit: Payer: 59 | Admitting: Allergy

## 2021-05-05 ENCOUNTER — Encounter: Payer: Medicare Other | Admitting: Family Medicine

## 2021-05-05 ENCOUNTER — Other Ambulatory Visit (HOSPITAL_BASED_OUTPATIENT_CLINIC_OR_DEPARTMENT_OTHER): Payer: Self-pay

## 2021-05-05 DIAGNOSIS — R002 Palpitations: Secondary | ICD-10-CM

## 2021-05-05 DIAGNOSIS — I471 Supraventricular tachycardia: Secondary | ICD-10-CM

## 2021-05-05 MED ORDER — METOPROLOL TARTRATE 25 MG PO TABS: 25.0000 mg | ORAL_TABLET | ORAL | 3 refills | Status: DC | PRN

## 2021-05-09 ENCOUNTER — Telehealth: Payer: Self-pay | Admitting: Cardiovascular Disease

## 2021-05-09 DIAGNOSIS — Z08 Encounter for follow-up examination after completed treatment for malignant neoplasm: Secondary | ICD-10-CM | POA: Diagnosis not present

## 2021-05-09 DIAGNOSIS — L814 Other melanin hyperpigmentation: Secondary | ICD-10-CM | POA: Diagnosis not present

## 2021-05-09 DIAGNOSIS — Z85828 Personal history of other malignant neoplasm of skin: Secondary | ICD-10-CM | POA: Diagnosis not present

## 2021-05-09 DIAGNOSIS — D2371 Other benign neoplasm of skin of right lower limb, including hip: Secondary | ICD-10-CM | POA: Diagnosis not present

## 2021-05-09 DIAGNOSIS — L570 Actinic keratosis: Secondary | ICD-10-CM | POA: Diagnosis not present

## 2021-05-09 DIAGNOSIS — D692 Other nonthrombocytopenic purpura: Secondary | ICD-10-CM | POA: Diagnosis not present

## 2021-05-09 DIAGNOSIS — L821 Other seborrheic keratosis: Secondary | ICD-10-CM | POA: Diagnosis not present

## 2021-05-09 NOTE — Telephone Encounter (Signed)
Pt c/o medication issue:  1. Name of Medication: metoprolol tartrate (LOPRESSOR) 25 MG tablet  2. How are you currently taking this medication (dosage and times per day)?    3. Are you having a reaction (difficulty breathing--STAT)?  4. What is your medication issue?   Patient is following up. She states [facility] is needing more information regarding her prescription before they can distribute. She states they informed her that they have made several attempts to contact our office with no success, but she is unsure exactly what they are needing. Please assist.

## 2021-05-09 NOTE — Telephone Encounter (Signed)
I spoke to the pharmacist to clarify medication instructions. Patient to take half or whole tablet of Metoprolol Tartrate (Lopressor) 25 MG as needed for palpations. Also left message for patient on voicemail.

## 2021-05-09 NOTE — Telephone Encounter (Signed)
Routed to [facility] refills

## 2021-05-16 ENCOUNTER — Ambulatory Visit (INDEPENDENT_AMBULATORY_CARE_PROVIDER_SITE_OTHER): Payer: Medicare Other | Admitting: Family Medicine

## 2021-05-16 ENCOUNTER — Encounter: Payer: Self-pay | Admitting: Family Medicine

## 2021-05-16 VITALS — BP 110/74 | HR 63 | Temp 97.9°F | Resp 16 | Ht 68.0 in | Wt 183.2 lb

## 2021-05-16 DIAGNOSIS — E559 Vitamin D deficiency, unspecified: Secondary | ICD-10-CM | POA: Diagnosis not present

## 2021-05-16 DIAGNOSIS — Z0000 Encounter for general adult medical examination without abnormal findings: Secondary | ICD-10-CM

## 2021-05-16 DIAGNOSIS — E7800 Pure hypercholesterolemia, unspecified: Secondary | ICD-10-CM

## 2021-05-16 LAB — HEPATIC FUNCTION PANEL
ALT: 12 U/L (ref 0–35)
AST: 14 U/L (ref 0–37)
Albumin: 4.1 g/dL (ref 3.5–5.2)
Alkaline Phosphatase: 59 U/L (ref 39–117)
Bilirubin, Direct: 0.1 mg/dL (ref 0.0–0.3)
Total Bilirubin: 0.7 mg/dL (ref 0.2–1.2)
Total Protein: 6.7 g/dL (ref 6.0–8.3)

## 2021-05-16 LAB — LIPID PANEL
Cholesterol: 237 mg/dL — ABNORMAL HIGH (ref 0–200)
HDL: 70.4 mg/dL (ref >39.00)
LDL Cholesterol: 139 mg/dL — ABNORMAL HIGH (ref 0–99)
NonHDL: 167.01
Total CHOL/HDL Ratio: 3
Triglycerides: 142 mg/dL (ref 0.0–149.0)
VLDL: 28.4 mg/dL (ref 0.0–40.0)

## 2021-05-16 LAB — CBC WITH DIFFERENTIAL/PLATELET
Basophils Absolute: 0 10*3/uL (ref 0.0–0.1)
Basophils Relative: 0.8 % (ref 0.0–3.0)
Eosinophils Absolute: 0.1 10*3/uL (ref 0.0–0.7)
Eosinophils Relative: 2.7 % (ref 0.0–5.0)
HCT: 39.7 % (ref 36.0–46.0)
Hemoglobin: 13.2 g/dL (ref 12.0–15.0)
Lymphocytes Relative: 41 % (ref 12.0–46.0)
Lymphs Abs: 2.1 10*3/uL (ref 0.7–4.0)
MCHC: 33.1 g/dL (ref 30.0–36.0)
MCV: 95 fl (ref 78.0–100.0)
Monocytes Absolute: 0.6 10*3/uL (ref 0.1–1.0)
Monocytes Relative: 11.8 % (ref 3.0–12.0)
Neutro Abs: 2.3 10*3/uL (ref 1.4–7.7)
Neutrophils Relative %: 43.7 % (ref 43.0–77.0)
Platelets: 194 10*3/uL (ref 150.0–400.0)
RBC: 4.18 Mil/uL (ref 3.87–5.11)
RDW: 13.5 % (ref 11.5–15.5)
WBC: 5.2 10*3/uL (ref 4.0–10.5)

## 2021-05-16 LAB — VITAMIN D 25 HYDROXY (VIT D DEFICIENCY, FRACTURES): VITD: 23.76 ng/mL — ABNORMAL LOW (ref 30.00–100.00)

## 2021-05-16 LAB — BASIC METABOLIC PANEL
BUN: 15 mg/dL (ref 6–23)
CO2: 29 mEq/L (ref 19–32)
Calcium: 9.3 mg/dL (ref 8.4–10.5)
Chloride: 102 mEq/L (ref 96–112)
Creatinine, Ser: 0.69 mg/dL (ref 0.40–1.20)
GFR: 90.74 mL/min (ref >60.00)
Glucose, Bld: 90 mg/dL (ref 70–99)
Potassium: 4 mEq/L (ref 3.5–5.1)
Sodium: 137 mEq/L (ref 135–145)

## 2021-05-16 LAB — TSH: TSH: 2.76 u[IU]/mL (ref 0.35–5.50)

## 2021-05-16 NOTE — Assessment & Plan Note (Signed)
Pt's PE WNL.  UTD on colonoscopy- changed to q7 yrs per GI- mammo, Tdap.  Declines flu and PNA today since she developed palpitations after COVID booster.  Check labs.  Anticipatory guidance provided on healthy diet, regular exercise, upcoming health maintenance.

## 2021-05-16 NOTE — Assessment & Plan Note (Signed)
Check labs and replete prn. 

## 2021-05-16 NOTE — Progress Notes (Signed)
Subjective:    Patient ID: [name] [name], female    DOB: 31-Mar-1956, 65 y.o.   MRN: [id number]  HPI Welcome to Medicare CPE  Risk Factors: Hyperlipidemia- last LDL 121.  Attempting to control w/ diet and exercise OSA- uses CPAP nightly Breast cancer- s/p lumpectomy, radiation, and on Anastrozole Physical Activity: exercising regularly- pickle ball and swimming Fall Risk: low risk Depression: no current sxs Hearing: normal to conversational tones w/ hearing aids in place ADL's: independent Cognitive: normal linear thought process, memory and attention intact Home Safety: safe at home, lives w/ husband.  Still has family in [place] Height, Weight, BMI, Visual Acuity: see vitals, vision corrected to 20/20 w/ glasses Counseling: colonoscopy is now due Sept 2024. UTD on mammo, Tdap.  Declines flu shot due to arrhythmia that presented after COVID booster.  Feels similarly about the PNA vaccine Health Care POA/Living will Does not smoke or vape, use any controlled substances or illicit drugs.  Occasional wine Labs Ordered: See A&P Care Plan: See A&P   Patient Care Team    Relationship Specialty Notifications Start End  Midge Minium, MD PCP - General Family Medicine  11/02/15   Skeet Latch, MD PCP - Cardiology Cardiology  01/18/21   Nicholas Lose, MD Consulting Physician Hematology and Oncology  11/02/15     Health Maintenance  Topic Date Due   COLONOSCOPY (Pts 45-19yrs Insurance coverage will need to be confirmed)  01/31/2021   COVID-19 Vaccine (5 - Booster for Pfizer series) 02/11/2021   Pneumonia Vaccine 54+ Years old (1 - PCV) 05/23/2021 (Originally 06/06/1961)   INFLUENZA VACCINE  08/25/2021 (Originally 12/26/2020)   MAMMOGRAM  11/16/2022   TETANUS/TDAP  11/01/2025   DEXA SCAN  Completed   Hepatitis C Screening  Completed   HIV Screening  Completed   Zoster Vaccines- Shingrix  Completed   HPV VACCINES  Aged Out      Review of Systems Patient reports no vision/  hearing changes, adenopathy,fever, persistant/recurrent hoarseness , swallowing issues, chest pain, edema, persistant/recurrent cough, hemoptysis, dyspnea (rest/exertional/paroxysmal nocturnal), gastrointestinal bleeding (melena, rectal bleeding), abdominal pain, significant heartburn, bowel changes, GU symptoms (dysuria, hematuria, incontinence), Gyn symptoms (abnormal  bleeding, pain),  syncope, focal weakness, memory loss, numbness & tingling, skin/hair/nail changes, abnormal bruising or bleeding, anxiety, or depression.   + 6 lb weight gain + palpitations that started after COVID booster  This visit occurred during the SARS-CoV-2 public health emergency.  Safety protocols were in place, including screening questions prior to the visit, additional usage of staff PPE, and extensive cleaning of exam room while observing appropriate contact time as indicated for disinfecting solutions.      Objective:   Physical Exam General Appearance:    Alert, cooperative, no distress, appears stated age  Head:    Normocephalic, without obvious abnormality, atraumatic  Eyes:    PERRL, conjunctiva/corneas clear, EOM's intact, fundi    benign, both eyes  Ears:    Normal TM's and external ear canals, both ears  Nose:   Deferred due to COVID  Throat:   Neck:   Supple, symmetrical, trachea midline, no adenopathy;    Thyroid: no enlargement/tenderness/nodules  Back:     Symmetric, no curvature, ROM normal, no CVA tenderness  Lungs:     Clear to auscultation bilaterally, respirations unlabored  Chest Wall:    No tenderness or deformity   Heart:    Regular rate and rhythm, S1 and S2 normal, no murmur, rub   or gallop  Breast Exam:  Deferred to mammo  Abdomen:     Soft, non-tender, bowel sounds active all four quadrants,    no masses, no organomegaly  Genitalia:    Deferred   Rectal:    Extremities:   Extremities normal, atraumatic, no cyanosis or edema  Pulses:   2+ and symmetric all extremities  Skin:    Skin color, texture, turgor normal, no rashes or lesions  Lymph nodes:   Cervical, supraclavicular, and axillary nodes normal  Neurologic:   CNII-XII intact, normal strength, sensation and reflexes    throughout          Assessment & Plan:

## 2021-05-16 NOTE — Assessment & Plan Note (Signed)
Chronic problem.  Attempting to control w/ diet and exercise.  Check labs to determine if meds needed.

## 2021-05-16 NOTE — Patient Instructions (Addendum)
Follow up in 1 year or as needed We'll notify you of your lab results and make any changes if needed Continue to work on healthy diet and regular exercise- you look great! Let me know if you change your mind about the pneumonia vaccine- we can do it anytime! Call with any questions or concerns Stay Safe!  Stay Healthy! Happy Holidays! Enjoy your trip!!

## 2021-05-18 ENCOUNTER — Telehealth: Payer: Self-pay

## 2021-05-18 DIAGNOSIS — E559 Vitamin D deficiency, unspecified: Secondary | ICD-10-CM

## 2021-05-18 MED ORDER — VITAMIN D (ERGOCALCIFEROL) 1.25 MG (50000 UNIT) PO CAPS: 50000.0000 [IU] | ORAL_CAPSULE | ORAL | 0 refills | Status: DC

## 2021-05-18 NOTE — Telephone Encounter (Signed)
Patient aware of labs and to start vitamin d

## 2021-05-18 NOTE — Telephone Encounter (Signed)
-----   Message from Midge Minium, MD sent at 05/16/2021 12:31 PM EST ----- Vit D is low.  Based on this, we need to start prescription 50,000 units weekly x12 weeks in addition to daily OTC supplement of at least 2000 units.   Your cholesterol has increased somewhat since last visit but this will improve w/ healthy diet and regular exercise.  Thankfully your ratio of good to bad cholesterol is still excellent and doesn't require medication  Remainder of labs look great!

## 2021-07-31 DIAGNOSIS — H906 Mixed conductive and sensorineural hearing loss, bilateral: Secondary | ICD-10-CM | POA: Diagnosis not present

## 2021-08-04 DIAGNOSIS — G4733 Obstructive sleep apnea (adult) (pediatric): Secondary | ICD-10-CM | POA: Diagnosis not present

## 2021-08-08 ENCOUNTER — Ambulatory Visit: Payer: Medicare Other | Admitting: Hematology and Oncology

## 2021-08-08 NOTE — Progress Notes (Signed)
? ?Patient Care Team: ?Midge Minium, MD as PCP - General (Family Medicine) ?Skeet Latch, MD as PCP - Cardiology (Cardiology) ?Nicholas Lose, MD as Consulting Physician (Hematology and Oncology) ? ?DIAGNOSIS:  ?Encounter Diagnosis  ?Name Primary?  ? Primary cancer of upper outer quadrant of right breast ([place])   ? ? ?SUMMARY OF ONCOLOGIC HISTORY: ?Oncology History  ?Primary cancer of upper outer quadrant of right breast ([place])  ?09/30/2014 Initial Diagnosis  ? Right breast invasive ductal carcinoma grade 1, Ki-67 5%, ER positive, PR positive, HER-2 negative, 1.4 cm mass at 11:30 position no lymph nodes by ultrasound T1 cN0 M0 stage IA clinical stage ?  ?11/02/2014 Surgery  ? Right lumpectomy: Invasive ductal carcinoma with extracellular mucin, grade 2/3, 1.8 cm, 0/2 lymph nodes negative, ER 98%, PR 63%, HER-2 negative ratio 1.58, Ki-67 7%, Oncotype DX score 12, 8% risk of recurrence, T1 cN0 stage IA ?  ?12/28/2014 - 01/27/2015 Radiation Therapy  ? Adjuvant radiation therapy with boost ?  ?03/01/2015 -  Anti-estrogen oral therapy  ? Anastrozole 1 mg daily ?  ? ? ?CHIEF COMPLIANT: Follow-up of right breast cancer on anastrozole therapy ? ?INTERVAL HISTORY: [name] [name] is a 67 y.o. with above-mentioned history of right breast cancer treated with lumpectomy, radiation, and who is currently on anastrozole therapy. Mammogram on 11/03/19 showed no evidence of malignancy bilaterally. She presents to the clinic today for follow-up.  She denies any lumps or nodules in the breast.  She was able to travel to [place] and see her granddaughter.  She is expecting another grand son or granddaughter soon. ? ? ?ALLERGIES:  has No Known Allergies. ? ?MEDICATIONS:  ?Current Outpatient Medications  ?Medication Sig Dispense Refill  ? anastrozole (ARIMIDEX) 1 MG tablet Take 1 tablet (1 mg total) by mouth daily. 90 tablet 3  ? Cholecalciferol (VITAMIN D3) 2000 units TABS Take 2,000 Units by mouth daily.    ? metoprolol tartrate  (LOPRESSOR) 25 MG tablet Take 1 tablet (25 mg total) by mouth as needed (Take half or whole tablet as needed for palpitations.). 90 tablet 3  ? Vitamin D, Ergocalciferol, (DRISDOL) 1.25 MG (50000 UNIT) CAPS capsule Take 1 capsule (50,000 Units total) by mouth every 7 (seven) days. 12 capsule 0  ? vitamin E 1000 UNIT capsule Take 1,000 Units by mouth daily.    ? ?No current facility-administered medications for this visit.  ? ? ?PHYSICAL EXAMINATION: ?ECOG PERFORMANCE STATUS: 1 - Symptomatic but completely ambulatory ? ?Vitals:  ? 08/10/21 0957  ?BP: 137/78  ?Pulse: 65  ?Resp: 18  ?Temp: 97.9 ?F (36.6 ?C)  ?SpO2: 99%  ? ?Filed Weights  ? 08/10/21 0957  ?Weight: 183 lb 1.6 oz (83.1 kg)  ? ? ?BREAST: No palpable masses or nodules in either right or left breasts. No palpable axillary supraclavicular or infraclavicular adenopathy no breast tenderness or nipple discharge. (exam performed in the presence of a chaperone) ? ?LABORATORY DATA:  ?I have reviewed the data as listed ?CMP Latest Ref Rng & Units 05/16/2021 12/12/2020 04/01/2020  ?Glucose 70 - 99 mg/dL 90 102(H) 98  ?BUN 6 - 23 mg/dL _0 ?Creatinine 0.40 - 1.20 mg/dL 0.69 0.84 0.75  ?Sodium 135 - 145 mEq/L 137 141 141  ?Potassium 3.5 - 5.1 mEq/L 4.0 5.0 4.9  ?Chloride 96 - 112 mEq/L 102 103 104  ?CO2 19 - 32 mEq/L 29 25 32  ?Calcium 8.4 - 10.5 mg/dL 9.3 9.7 9.4  ?Total Protein 6.0 - 8.3 g/dL 6.7 6.8  6.6  ?Total Bilirubin 0.2 - 1.2 mg/dL 0.7 0.6 0.7  ?Alkaline Phos 39 - 117 U/L 59 64 55  ?AST 0 - 37 U/L _0 ?ALT 0 - 35 U/L _1 ? ? ?Lab Results  ?Component Value Date  ? WBC 5.2 05/16/2021  ? HGB 13.2 05/16/2021  ? HCT 39.7 05/16/2021  ? MCV 95.0 05/16/2021  ? PLT 194.0 05/16/2021  ? NEUTROABS 2.3 05/16/2021  ? ? ?ASSESSMENT & PLAN:  ?Primary cancer of upper outer quadrant of right breast ?Right lumpectomy 11/02/2014: Invasive ductal carcinoma with extracellular mucin, grade 2/3, 1.8 cm, 0/2 lymph nodes negative, ER 98%, PR 63%, HER-2 negative ratio  1.58, Ki-67 7%, Oncotype DX score 12, 8% risk of recurrence, T1 cN0 stage IA status post radiation completed 01/27/2015 ?  ?Current treatment: Anastrozole adjuvant therapy 1 mg daily ?7 years started October 2016 completed March 2023 ?Anastrozole toxicities:   ?1. Hot flashes Have resolved since she started taking half a tablet twice a day ?Since she completed 7 years of therapy we decided to stop it at this time. ? ?Patient was finally able to see her granddaughter who is 2 and half years old and [place].  It was delayed because of COVID-19 previously. ?She is going to go to [place] for 3 weeks ? ?Breast Cancer Surveillance: ?1. Breast exam 08/10/2021 tenderness in the right breast: Benign ?2. Mammogram 11/15/2020 at [facility] benign. Postsurgical changes.  Breast density category C  ?  ?Return to clinic in 1 year for follow-up ? ? ? ?No orders of the defined types were placed in this encounter. ? ?The patient has a good understanding of the overall plan. she agrees with it. she will call with any problems that may develop before the next visit here. ?Total time spent: 30 mins including face to face time and time spent for planning, charting and co-ordination of care ? ? Harriette Ohara, MD ?08/10/21 ? ? ? I, Gardiner Coins, am acting as a scribe for Dr. Lindi Adie  ?

## 2021-08-10 ENCOUNTER — Inpatient Hospital Stay: Payer: Medicare Other | Attending: Hematology and Oncology | Admitting: Hematology and Oncology

## 2021-08-10 ENCOUNTER — Other Ambulatory Visit: Payer: Self-pay

## 2021-08-10 DIAGNOSIS — C50411 Malignant neoplasm of upper-outer quadrant of right female breast: Secondary | ICD-10-CM

## 2021-08-10 DIAGNOSIS — Z853 Personal history of malignant neoplasm of breast: Secondary | ICD-10-CM | POA: Insufficient documentation

## 2021-08-10 DIAGNOSIS — Z923 Personal history of irradiation: Secondary | ICD-10-CM | POA: Diagnosis not present

## 2021-08-10 DIAGNOSIS — Z8616 Personal history of COVID-19: Secondary | ICD-10-CM | POA: Diagnosis not present

## 2021-08-10 NOTE — Assessment & Plan Note (Signed)
Right lumpectomy 11/02/2014: Invasive ductal carcinoma with extracellular mucin, grade 2/3, 1.8 cm, 0/2 lymph nodes negative, ER 98%, PR 63%, HER-2 negative ratio 1.58, Ki-67 7%, Oncotype DX score 12, 8% risk of recurrence, T1 cN0 stage IA status post radiation completed 01/27/2015 ?? ?Current treatment: Anastrozole adjuvant therapy 1 mg daily ?5 years started October 2016 ?Anastrozole toxicities: ? ?1. Hot flashes?Have resolved since she started taking half a tablet twice a day ?? ?Patient was unable to see her grandchild in [place] because of COVID-19.  ?? ?Breast Cancer Surveillance: ?1. Breast exam?08/10/2021 tenderness in the right breast:?Benign ?2. Mammogram?11/15/2020 at [facility] benign. Postsurgical changes.??Breast density category C? ?? ?Return to clinic in 1 year for follow-up ?

## 2021-08-28 NOTE — Progress Notes (Signed)
? ?Cardiology Office Note ?Date:  08/29/2021  ?Patient ID:  [name] [name], DOB [birth year], MRN [id number] ?PCP:  Midge Minium, MD  ?Cardiologist:  Dr. Oval Linsey ?Electrophysiologist: Dr. Rayann Heman ? ?  ?Chief Complaint:  6 mo ? ?History of Present Illness: ?[name] [name] is a 66 y.o. female with history of SVT (ablated 2004), Breast Ca (R side, lumpectomy, XRT, anastrozole),  ? ?She comes in today to be seen for Dr. Rayann Heman, last seen by him Oct 2022, she had been referred for intermittent palpitations, event monitor placed 11/2020 which revealed nonsustained atrial tachycardia up to 4 beats in duration as well as PVCs and PACs.  No sustained arrhythmias were observed. ?She had not yet tried the metoprolol prescribed for her.  Pt noticed perhaps red wine was a trigger ?Without sustained arrhythmias, recommended lifestyle management, weight loss, exercise, avoiding triggers. ?Suspected perhaps AT 2/2 to hx of chest radiation (breast CA) and/or aging. ?Advised the PRN use of the metoprolol. ?Could consider flecainide if needed. ? ? ?TODAY ?She is doing OK ?She is taking the lopressor, ended up with 1/2 tab BID.  It has helped reduce the palpitations though not eliminated them, she can tell when the medicine is running out, due for the 2nd dose. ?No sustained palpitations, but intermittently can have a day here/there that they are frequent. ? ?She swims and play pickle ball for exercise ?Sometimes the palpitations will happen and make her feel funny she has to stop.No CP ?SOB, and otherwise feels quite well. ?No near syncope or syncope. ? ?Past Medical History:  ?Diagnosis Date  ? Cancer [facility]) 2016  ? right breast cancer  ? Cancer of right breast ([place])   ? last radiation was 01/2015  ? Dysrhythmia   ? no problems since ablation 2004  ? Lipoma of abdominal wall   ? OSA (obstructive sleep apnea) 04/17/2016  ? Paroxysmal SVT (supraventricular tachycardia) ([place]) 2004  ? Status post RFA ABLATION  ? S/P radiation therapy  12/28/2014 through 01/27/2015                                                    ? Right breast 4250 cGy 17 sessions, right breast boost 1200 cGy in 6 sessions                        ? ? ?Past Surgical History:  ?Procedure Laterality Date  ? ABDOMINAL HYSTERECTOMY  1999  ? WITH BLADDER REPAIR FOR PROPLAPSE-HAS OVARIES (NO CA)  ? BREAST LUMPECTOMY WITH RADIOACTIVE SEED AND SENTINEL LYMPH NODE BIOPSY Right 11/02/2014  ? Procedure: RIGHT BREAST LUMPECTOMY WITH RADIOACTIVE SEED AND RIGHT AXILLARY SENTINEL LYMPH NODE BIOPSY;  Surgeon: Excell Seltzer, MD;  Location: [place];  Service: General;  Laterality: Right;  ? COLONOSCOPY    ? KNEE CARTILAGE SURGERY    ? LIPOMA EXCISION N/A 03/23/2015  ? Procedure: EXCISION LIPOMA ABDOMINAL WALLL;  Surgeon: Excell Seltzer, MD;  Location: [place];  Service: General;  Laterality: N/A;  ? STAPEDECTOMY Right   ? SVT ablation  2000  ? ? ?Current Outpatient Medications  ?Medication Sig Dispense Refill  ? Cholecalciferol (VITAMIN D3) 2000 units TABS Take 2,000 Units by mouth daily.    ? metoprolol tartrate (LOPRESSOR) 25 MG tablet Take 25 mg by mouth daily at 6 (six)  AM.    ? vitamin E 1000 UNIT capsule Take 1,000 Units by mouth daily.    ? ?No current facility-administered medications for this visit.  ? ? ?Allergies:   Patient has no known allergies.  ? ?Social History:  The patient  reports that she quit smoking about 43 years ago. Her smoking use included cigarettes. She has a 12.00 pack-year smoking history. She has never used smokeless tobacco. She reports current alcohol use of about 4.0 - 5.0 standard drinks per week. She reports that she does not use drugs.  ? ?Family History:  The patient's family history includes Acute lymphoblastic leukemia in her paternal grandfather; Bladder Cancer in her father; CVA in her maternal grandmother and mother; Heart attack in her maternal grandfather; Leukemia in her paternal grandfather; Lung cancer in her  paternal aunt, paternal uncle, and paternal uncle; Ovarian cancer in her paternal aunt; Supraventricular tachycardia in her brother, grandchild, sister, and son. ? ?ROS:  Please see the history of present illness.    ?All other systems are reviewed and otherwise negative.  ? ?PHYSICAL EXAM:  ?VS:  BP 112/62   Pulse 78   Ht '5\' 9"'$  (1.753 m)   Wt 179 lb (81.2 kg)   SpO2 95%   BMI 26.43 kg/m?  BMI: Body mass index is 26.43 kg/m?. ?Well nourished, well developed, in no acute distress ?HEENT: normocephalic, atraumatic ?Neck: no JVD, carotid bruits or masses ?Cardiac:   RRR; no significant murmurs, no rubs, or gallops ?Lungs:  CTA b/l, no wheezing, rhonchi or rales ?Abd: soft, nontender ?MS: no deformity or atrophy ?Ext:  no edema ?Skin: warm and dry, no rash ?Neuro:  No gross deficits appreciated ?Psych: euthymic mood, full affect ? ? ?EKG:  not done today ? ? ?July 2022: ?3 Day Zio Monitor ?Quality: Fair.  Baseline artifact. ?Predominant rhythm: sinus rhythm ?Average heart rate: 71 bpm ?Max heart rate: 139 bpm ?Min heart rate: 47 bpm ?  ?7 runs of SVT up to 4 beat.  Maximum heart rate in SVT 200 bpm. ?Rare PVCs and PACs. ?  ? ? ?Recent Labs: ?12/12/2020: Magnesium 2.0 ?05/16/2021: ALT 12; BUN 15; Creatinine, Ser 0.69; Hemoglobin 13.2; Platelets 194.0; Potassium 4.0; Sodium 137; TSH 2.76  ?05/16/2021: Cholesterol 237; HDL 70.40; LDL Cholesterol 139; Total CHOL/HDL Ratio 3; Triglycerides 142.0; VLDL 28.4  ? ?CrCl cannot be calculated (Patient's most recent lab result is older than the maximum 21 days allowed.).  ? ?Wt Readings from Last 3 Encounters:  ?08/29/21 179 lb (81.2 kg)  ?08/10/21 183 lb 1.6 oz (83.1 kg)  ?05/16/21 183 lb 3.2 oz (83.1 kg)  ?  ? ?Other studies reviewed: ?Additional studies/records reviewed today include: summarized above ? ?ASSESSMENT AND PLAN: ? ?SVT, likely an AT ?We will 1st change her metoprolol to Succinate '25mg'$  daily, keep her tartrate 1/2 tab though PRN for palpitations ?Discussed we can  add on an AAD if this doesn't further reduce/control her ectopy/palpitations ? ? ? ?Disposition: She would like to switch to another EP MD given Dr. Jackalyn Lombard slowdown, will have her see Dr. Curt Bears in 69mo sooner if needed ? ? ? ?Current medicines are reviewed at length with the patient today.  The patient did not have any concerns regarding medicines. ? ?Signed, ?RTommye Standard PA-C ?08/29/2021 2:07 PM    ? ?[facility] ?[address] ?[id number]?[phone] (office)  ?[phone](fax) ? ? ?

## 2021-08-29 ENCOUNTER — Encounter: Payer: Self-pay | Admitting: Physician Assistant

## 2021-08-29 ENCOUNTER — Ambulatory Visit: Payer: Medicare Other | Admitting: Physician Assistant

## 2021-08-29 VITALS — BP 112/62 | HR 78 | Ht 69.0 in | Wt 179.0 lb

## 2021-08-29 DIAGNOSIS — R002 Palpitations: Secondary | ICD-10-CM

## 2021-08-29 DIAGNOSIS — I471 Supraventricular tachycardia: Secondary | ICD-10-CM

## 2021-08-29 MED ORDER — METOPROLOL SUCCINATE ER 25 MG PO TB24: 25.0000 mg | ORAL_TABLET | Freq: Every day | ORAL | 3 refills | Status: DC

## 2021-08-29 NOTE — Patient Instructions (Addendum)
Medication Instructions:  ? ?START TAKING: TOPROL XL 25 MG ONCE A DAY  ? ?START TAKING: LOPRESSOR 12.5  ( HALF OF 25 MG ) TWICE A DAY AS NEEDED FOR PALPITATIONS ? ? ?*If you need a refill on your cardiac medications before your next appointment, please call your pharmacy* ? ? ?Lab Work: [place] ? ? ?If you have labs (blood work) drawn today and your tests are completely normal, you will receive your results only by: ?MyChart Message (if you have MyChart) OR ?A paper copy in the mail ?If you have any lab test that is abnormal or we need to change your treatment, we will call you to review the results. ? ? ?Testing/Procedures: NONE ORDERED  TODAY ? ? ? ? ?Follow-Up: ?At [facility], you and your health needs are our priority.  As part of our continuing mission to provide you with exceptional heart care, we have created designated Provider Care Teams.  These Care Teams include your primary Cardiologist (physician) and Advanced Practice Providers (APPs -  Physician Assistants and Nurse Practitioners) who all work together to provide you with the care you need, when you need it. ? ?We recommend signing up for the patient portal called "MyChart".  Sign up information is provided on this After Visit Summary.  MyChart is used to connect with patients for Virtual Visits (Telemedicine).  Patients are able to view lab/test results, encounter notes, upcoming appointments, etc.  Non-urgent messages can be sent to your provider as well.   ?To learn more about what you can do with MyChart, go to [email or web address].   ? ?Your next appointment:   ?6 month(s) ? ?The format for your next appointment:   ?In Person ? ?Provider:   ?Allegra Lai, MD  ? ? ?Other Instructions ? ?

## 2021-11-21 ENCOUNTER — Encounter: Payer: Self-pay | Admitting: Hematology and Oncology

## 2021-11-21 DIAGNOSIS — Z1231 Encounter for screening mammogram for malignant neoplasm of breast: Secondary | ICD-10-CM | POA: Diagnosis not present

## 2021-11-21 LAB — HM MAMMOGRAPHY

## 2021-11-22 ENCOUNTER — Encounter: Payer: Self-pay | Admitting: Family Medicine

## 2021-12-11 ENCOUNTER — Ambulatory Visit (INDEPENDENT_AMBULATORY_CARE_PROVIDER_SITE_OTHER): Payer: Medicare Other

## 2021-12-11 ENCOUNTER — Ambulatory Visit: Payer: Medicare Other | Admitting: Orthopaedic Surgery

## 2021-12-11 ENCOUNTER — Telehealth: Payer: Self-pay | Admitting: Orthopaedic Surgery

## 2021-12-11 ENCOUNTER — Encounter: Payer: Self-pay | Admitting: Orthopaedic Surgery

## 2021-12-11 ENCOUNTER — Encounter: Payer: Self-pay | Admitting: Family Medicine

## 2021-12-11 DIAGNOSIS — M25552 Pain in left hip: Secondary | ICD-10-CM

## 2021-12-11 DIAGNOSIS — M25551 Pain in right hip: Secondary | ICD-10-CM

## 2021-12-11 NOTE — Addendum Note (Signed)
Addended by: Elvin So L on: 12/11/2021 11:04 AM   Modules accepted: Orders

## 2021-12-11 NOTE — Telephone Encounter (Signed)
[name] [name] is going out of town for 6 weeks starting August 16. She needs to condense her PT  in order to finish before she leaves if possible. Please advise

## 2021-12-11 NOTE — Progress Notes (Signed)
The patient is a very pleasant 66 year old female that I have not seen in some time.  She is athletic and an avid Doctor, general practice.  She is been having pain in her right hip for couple months now but she points to the ASIS and hip flexors as a source of her pain and denies any specific groin pain or lateral pain.  She denies any specific injuries.  Is been hurting some on her left side as well.  She has tried stretching and has tried ibuprofen and that did not really help.  She walks without a limp or an assistive device.  Both her right and left hips move smoothly and fluidly with no blocks or rotation and no pain in the groin at all.  When I have her lay in the supine position extreme flexion of the right hip causes just a touch of pain but not a lot.  When I have her lay on her side with the right hip up she has minimal pain over the trochanteric area but she does have pain in the facet joints of her back to the right side.  An AP pelvis and lateral both hips shows well-maintained joint space with no significant findings.  The arthritis is minimal.  The AP view standing of her hips does show the lower aspect of her lumbar spine which there is is some arthritic changes.  I gave her reassurance that her hips look good but I would like to send her to outpatient physical therapy given that she is an avid pickleball player and her pain is kept her from doing those types of activities.  I like that the therapist take a look at her lower back to the right side with any modalities that can help this feel better as well as her hip flexors.  I would then like to see her back in about 6 weeks.  If she still having the same pain in her back I would like a 6 AP and lateral of her lumbar spine.  All question concerns were answered and addressed.  She agrees with this treatment plan.

## 2021-12-13 ENCOUNTER — Other Ambulatory Visit: Payer: Self-pay

## 2021-12-13 ENCOUNTER — Ambulatory Visit: Payer: Medicare Other | Admitting: Physical Therapy

## 2021-12-13 ENCOUNTER — Encounter: Payer: Self-pay | Admitting: Physical Therapy

## 2021-12-13 DIAGNOSIS — M6281 Muscle weakness (generalized): Secondary | ICD-10-CM | POA: Diagnosis not present

## 2021-12-13 DIAGNOSIS — M25652 Stiffness of left hip, not elsewhere classified: Secondary | ICD-10-CM | POA: Diagnosis not present

## 2021-12-13 DIAGNOSIS — M25551 Pain in right hip: Secondary | ICD-10-CM

## 2021-12-13 DIAGNOSIS — M25651 Stiffness of right hip, not elsewhere classified: Secondary | ICD-10-CM | POA: Diagnosis not present

## 2021-12-13 NOTE — Therapy (Signed)
OUTPATIENT PHYSICAL THERAPY LOWER EXTREMITY EVALUATION   Patient Name: [name] [name] MRN: [id number] DOB:01/25/1956, 66 y.o., female Today's Date: 12/13/2021  End of Session:  PT End of Session - 12/13/21 1152     Visit Number 1    Number of Visits 5    Date for PT Re-Evaluation 03/13/22    Authorization Type [employer] MEDICARE    Authorization Time Period [employer] MEDICARE    Progress Note Due on Visit 10    PT Start Time 1147    PT Stop Time 1244    PT Time Calculation (min) 57 min    Activity Tolerance Patient tolerated treatment well    Behavior During Therapy WFL for tasks assessed/performed             Past Medical History:  Diagnosis Date   Cancer ([place]) 2016   right breast cancer   Cancer of right breast ([place])    last radiation was 01/2015   Dysrhythmia    no problems since ablation 2004   Lipoma of abdominal wall    OSA (obstructive sleep apnea) 04/17/2016   Paroxysmal SVT (supraventricular tachycardia) ([place]) 2004   Status post RFA ABLATION   S/P radiation therapy 12/28/2014 through 01/27/2015                                                     Right breast 4250 cGy 17 sessions, right breast boost 1200 cGy in 6 sessions                         Past Surgical History:  Procedure Laterality Date   ABDOMINAL HYSTERECTOMY  1999   WITH BLADDER REPAIR FOR PROPLAPSE-HAS OVARIES (NO CA)   BREAST LUMPECTOMY WITH RADIOACTIVE SEED AND SENTINEL LYMPH NODE BIOPSY Right 11/02/2014   Procedure: RIGHT BREAST LUMPECTOMY WITH RADIOACTIVE SEED AND RIGHT AXILLARY SENTINEL LYMPH NODE BIOPSY;  Surgeon: Excell Seltzer, MD;  Location: [place];  Service: General;  Laterality: Right;   COLONOSCOPY     KNEE CARTILAGE SURGERY     LIPOMA EXCISION N/A 03/23/2015   Procedure: [place] WALLL;  Surgeon: Excell Seltzer, MD;  Location: [place];  Service: General;  Laterality: N/A;   STAPEDECTOMY Right    SVT ablation  2000    Patient Active Problem List   Diagnosis Date Noted   Hyperlipidemia 03/25/2018   Vitamin D deficiency 03/19/2017   Status post arthroscopy of left knee 03/07/2017   OSA (obstructive sleep apnea) 04/17/2016   Polyarthralgia 12/29/2015   Physical exam 11/02/2015   Primary cancer of upper outer quadrant of right breast ([place]) 10/22/2014   Palpitations 12/25/2012    PCP: Midge Minium, MD  REFERRING PROVIDER: Mcarthur Rossetti, MD  REFERRING DIAG: [phone] (ICD-10-CM) - Pain of right hip  THERAPY DIAG:  Pain in right hip  Stiffness of right hip, not elsewhere classified  Stiffness of left hip, not elsewhere classified  Muscle weakness (generalized)  Rationale for Evaluation and Treatment Rehabilitation  ONSET DATE: 12/11/21 MD referral to PT to address Rt hip pain, pain onset a couple months ago  SUBJECTIVE:   SUBJECTIVE STATEMENT: Hip pain started a couple months ago from sitting down on low surface after a pickleball game and skewed sitting. She feels fine standing straight, sitting straight,  walking, and stairs and she continues playing pickleball now, but symptoms come on with weight shift in standing. She changes pickleball shoes every 2-3 months. She suspects it might be more related to back pain and had a fall 35 years ago that irritated her back since then. She is a stomach sleeper and sleeping on her R side irritates sx. A trip to [place] clogging last week irritated sx recently for 2 days with no relief until travelling home. PMH: saw oncologist in March and mammogram 3 weeks ago with no concerns, no concerns for abdominal lipoma, she takes the new metoprolol prescription and still experiences palpitations with the intent to make another appointment with cardiologist - no lightheadedness or dizziness and she continues to exercise  PERTINENT HISTORY: Right breast cancer lumpectomy 10/2014 last radiation 01/2015, upper right quadrant abdominal wall lipoma  excision 02/2015, supraventricular tachycardia with ablation 2004, atrial tachycardia with PVCs and PACs noted 08/2021  PAIN:  Are you having pain? Yes: NPRS scale: 0/10 currently, normally 5/10 but clogging raised to 10/10 pain Pain location: right hip at top of iliac crest and front of hip Pain description: tightness Aggravating factors: sitting or standing skewed, sit to stands Relieving factors: time of rest, sleep  PRECAUTIONS: None  WEIGHT BEARING RESTRICTIONS No  FALLS:  Has patient fallen in last 6 months? No  LIVING ENVIRONMENT: Lives with: lives with their spouse and 50 lb dog, does not walk dog with leash as was pulled over a year ago Lives in: House/apartment Stairs: Yes: Internal: 18 steps; on right going up Has following equipment at home:   OCCUPATION: retired from jobs as Network engineer, Surveyor, minerals, Training and development officer, cleaner  PLOF: Independent  PATIENT GOALS knowing cause of pain, stopping pain and stopping aggravating factors, continue active life   OBJECTIVE:   DIAGNOSTIC FINDINGS: 12/11/21: An AP pelvis and lateral both hips shows well-maintained joint space with no significant findings.  The arthritis is minimal.  The AP view standing of her hips does show the lower aspect of her lumbar spine which there is is some arthritic changes.  PATIENT SURVEYS:  FOTO 12/11/21 85%, risk adjusted score 60%, target 87%  COGNITION: Overall cognitive status: Within functional limits for tasks assessed     SENSATION: Light touch: WFL  MUSCLE LENGTH: Thomas test: Right -14 deg; Left -10 deg  POSTURE: 12/13/2021: increased lumbar lordosis, anterior pelvic tilt, and right pelvis higher  PALPATION: Right upper quadrant Soft tissue seems to be adhered to lower ribs Palpated anterior pelvic tilt and slight Rt sided pelvis higher Tenderness at origin of piriformis and one location on IT band distal to ischial tuberosity - no tenderness at iliac crest, Greater Trochanteric bursa,  other portions of IT band, glutes, sacrum  LOWER EXTREMITY ROM:  ROM A: AROM; P: PROM Right eval Left eval  Hip flexion WNL WNL  Hip extension    Hip abduction    Hip adduction    Hip internal rotation Prone P: 35* Prone  P: 25*  Hip external rotation Prone P: 35* painful at PSIS Prone P: 42*  Knee flexion    Knee extension    Ankle dorsiflexion    Ankle plantarflexion    Ankle inversion    Ankle eversion     (Blank rows = not tested)  LOWER EXTREMITY MMT:  MMT Right eval Left eval  Hip flexion    Hip extension  Prone 4-/5 Prone 4/5  Hip abduction 4/5 5/5  Hip adduction    Hip internal rotation  Hip external rotation    Knee flexion    Knee extension    Ankle dorsiflexion    Ankle plantarflexion    Ankle inversion    Ankle eversion     (Blank rows = not tested)  LOWER EXTREMITY SPECIAL TESTS:  Hip special tests: Ober's test: positive Rt side but painfree  FUNCTIONAL TESTS:  Single leg stance: Rt 24.78s with instability noted, Lt ended measurement at 30s  GAIT: Distance walked: >155f Assistive device utilized: None Level of assistance: Complete Independence Comments: anterior pelvic tilt with slightly flexed posture, will need education to correct   TODAY'S TREATMENT: 12/13/21 TherEx: -PT explained and demonstrated HEP items, pt verbalized understanding and returned demo supine pelvic tilt x 8 reps and each stretch 1 x 30s hold  PATIENT EDUCATION:  Education details: [email or web address] educated: Patient Education method: Explanation, Demonstration, Tactile cues, Verbal cues, and Handouts Education comprehension: verbalized understanding, returned demonstration, and verbal cues required   HOME EXERCISE PROGRAM: Access Code: [email or web address]: [email or web address] Date: 12/13/2021 Prepared by: RJamey Reas Exercises - Supine Posterior Pelvic Tilt  - 1-2 x daily - 7 x weekly - 2-3 sets - 10 reps - 5 seconds hold - Supine Piriformis  Stretch with Foot on Ground  - 1 x daily - 7 x weekly - 1 sets - 2-3 reps - 20-30 seconds hold - Supine Figure 4 Piriformis Stretch  - 1 x daily - 7 x weekly - 1 sets - 2-3 reps - 20-30 seconds hold - Supine Piriformis Stretch with Leg Straight  - 1 x daily - 7 x weekly - 1 sets - 2-3 reps - 20-30 seconds hold - Thomas Stretch on Table  - 1 x daily - 7 x weekly - 1 sets - 2-3 reps - 20-30 seconds hold - Seated Piriformis Stretch with Trunk Bend  - 1 x daily - 7 x weekly - 1 sets - 2-3 reps - 20-30 seconds hold - Seated Piriformis Stretch  - 1 x daily - 7 x weekly - 1 sets - 2-3 reps - 20-30 seconds hold  ASSESSMENT:  CLINICAL IMPRESSION: Patient is a 66y.o. female who was seen today for physical therapy evaluation and treatment for right hip pain with standing, sit to/from stand transfers, and pickleball activity. She is continuing all ADLs and recreational sport/exercise and would like to be able to do those without pain. She presents with Rt sided hip flexor and IT band tightness, Rt glute weakness, painful and limited Rt hip ER ROM and hypermobile Rt hip IR. She will benefit form skilled PT to address muscle tightness/limits in ROM and glute & abdominal strengthening in order to reduce pain with functional and recreational activity.  OBJECTIVE IMPAIRMENTS Abnormal gait, decreased activity tolerance, decreased mobility, decreased ROM, decreased strength, impaired flexibility, postural dysfunction, and pain.   ACTIVITY LIMITATIONS standing, locomotion level, and pickleball  PARTICIPATION LIMITATIONS: community activity and exercise / recreational activities  PERSONAL FACTORS 3+ comorbidities: see PMH  are also affecting patient's functional outcome.   REHAB POTENTIAL: Good  CLINICAL DECISION MAKING: Stable/uncomplicated  EVALUATION COMPLEXITY: Low   GOALS: Goals reviewed with patient? Yes  SHORT TERM GOALS: Target date: 01/04/2022 Patient verbalized and demonstrated independence with  suggested HEP to maintain progress over vacation. Baseline: see objective data Goal status: INITIAL   LONG TERM GOALS: Target date: 03/13/2022  Patient will achieve target FOTO score >/= 87% Baseline: see objective data Goal status: INITIAL  2.  Patient reports </=1/10 pain with prolonged standing  and exercise activities to allow for maintenance of active lifestyle in community Baseline: see objective data Goal status: INITIAL  3.  Bilateral hip ROM values WNL  Baseline: see objective data Goal status: INITIAL  PLAN: PT FREQUENCY: 5 visits  PT DURATION:  5 sessions over 90 days  PLANNED INTERVENTIONS: Therapeutic exercises, Therapeutic activity, Neuromuscular re-education, Balance training, Gait training, Patient/Family education, Self Care, Joint mobilization, Dry Needling, Electrical stimulation, Cryotherapy, Moist heat, Taping, Manual therapy, Re-evaluation, and physical performance tests  PLAN FOR NEXT SESSION: review HEP and modify/adjust as indicated, begin glute and abdominal stabilization exercise in closed kinetic chain  Jana Hakim, SPT 12/13/2021, 2:35 PM  This entire session of physical therapy was performed under the direct supervision of PT signing evaluation /treatment. PT reviewed note and agrees.  [name], PT, DPT 12/13/2021, 3:15 PM

## 2021-12-25 ENCOUNTER — Encounter: Payer: Self-pay | Admitting: Physical Therapy

## 2021-12-25 ENCOUNTER — Ambulatory Visit: Payer: Medicare Other | Admitting: Physical Therapy

## 2021-12-25 DIAGNOSIS — M6281 Muscle weakness (generalized): Secondary | ICD-10-CM

## 2021-12-25 DIAGNOSIS — M25652 Stiffness of left hip, not elsewhere classified: Secondary | ICD-10-CM

## 2021-12-25 DIAGNOSIS — M25651 Stiffness of right hip, not elsewhere classified: Secondary | ICD-10-CM | POA: Diagnosis not present

## 2021-12-25 DIAGNOSIS — M25551 Pain in right hip: Secondary | ICD-10-CM | POA: Diagnosis not present

## 2021-12-25 NOTE — Therapy (Signed)
OUTPATIENT PHYSICAL THERAPY TREATMENT NOTE   Patient Name: [name] [name] MRN: [id number] DOB:1956/01/11, 66 y.o., female Today's Date: 12/25/2021  PCP: Midge Minium, MD REFERRING PROVIDER: Mcarthur Rossetti, MD  END OF SESSION:   PT End of Session - 12/25/21 1153     Visit Number 2    Number of Visits 5    Date for PT Re-Evaluation 03/13/22    Authorization Type [employer] MEDICARE    Authorization Time Period [employer] MEDICARE    Progress Note Due on Visit 10    PT Start Time 1150    PT Stop Time 1237    PT Time Calculation (min) 47 min    Activity Tolerance Patient tolerated treatment well    Behavior During Therapy [facility] for tasks assessed/performed             Past Medical History:  Diagnosis Date   Cancer ([place]) 2016   right breast cancer   Cancer of right breast ([place])    last radiation was 01/2015   Dysrhythmia    no problems since ablation 2004   Lipoma of abdominal wall    OSA (obstructive sleep apnea) 04/17/2016   Paroxysmal SVT (supraventricular tachycardia) ([place]) 2004   Status post RFA ABLATION   S/P radiation therapy 12/28/2014 through 01/27/2015                                                     Right breast 4250 cGy 17 sessions, right breast boost 1200 cGy in 6 sessions                         Past Surgical History:  Procedure Laterality Date   ABDOMINAL HYSTERECTOMY  1999   WITH BLADDER REPAIR FOR PROPLAPSE-HAS OVARIES (NO CA)   BREAST LUMPECTOMY WITH RADIOACTIVE SEED AND SENTINEL LYMPH NODE BIOPSY Right 11/02/2014   Procedure: RIGHT BREAST LUMPECTOMY WITH RADIOACTIVE SEED AND RIGHT AXILLARY SENTINEL LYMPH NODE BIOPSY;  Surgeon: Excell Seltzer, MD;  Location: [place];  Service: General;  Laterality: Right;   COLONOSCOPY     KNEE CARTILAGE SURGERY     LIPOMA EXCISION N/A 03/23/2015   Procedure: EXCISION LIPOMA ABDOMINAL WALLL;  Surgeon: Excell Seltzer, MD;  Location: [place];  Service: General;   Laterality: N/A;   STAPEDECTOMY Right    SVT ablation  2000   Patient Active Problem List   Diagnosis Date Noted   Hyperlipidemia 03/25/2018   Vitamin D deficiency 03/19/2017   Status post arthroscopy of left knee 03/07/2017   OSA (obstructive sleep apnea) 04/17/2016   Polyarthralgia 12/29/2015   Physical exam 11/02/2015   Primary cancer of upper outer quadrant of right breast ([place]) 10/22/2014   Palpitations 12/25/2012    REFERRING DIAG: M25.551 (ICD-10-CM) - Pain of right hip  THERAPY DIAG:  Pain in right hip  Stiffness of right hip, not elsewhere classified  Stiffness of left hip, not elsewhere classified  Muscle weakness (generalized)  Rationale for Evaluation and Treatment Rehabilitation  PERTINENT HISTORY: Right breast cancer lumpectomy 10/2014 last radiation 01/2015, upper right quadrant abdominal wall lipoma excision 02/2015, supraventricular tachycardia with ablation 2004, atrial tachycardia with PVCs and PACs noted 08/2021  PRECAUTIONS: None  SUBJECTIVE: She recently returned form a trip with lots of walking, doing the stretches occasionally. She notes  increased tightness upon standing, following sitting for a while. She continues staying active with pickleball.  PAIN:  Are you having pain? None seated, 4/10 pulling/tightness in anterior hip in standing   OBJECTIVE: (objective measures completed at initial evaluation unless otherwise dated) DIAGNOSTIC FINDINGS: 12/11/21: An AP pelvis and lateral both hips shows well-maintained joint space with no significant findings.  The arthritis is minimal.  The AP view standing of her hips does show the lower aspect of her lumbar spine which there is is some arthritic changes.   PATIENT SURVEYS:  FOTO 12/11/21 85%, risk adjusted score 60%, target 87%   COGNITION: Overall cognitive status: Within functional limits for tasks assessed                          SENSATION: Light touch: WFL   MUSCLE LENGTH: Eval: Thomas test:  Right -14 deg; Left -10 deg 12/25/21: Marcello Moores test: WNL bil. to table contact   POSTURE: 12/13/2021: increased lumbar lordosis, anterior pelvic tilt, and right pelvis higher   PALPATION: Right upper quadrant Soft tissue seems to be adhered to lower ribs Palpated anterior pelvic tilt and slight Rt sided pelvis higher Tenderness at origin of piriformis and one location on IT band distal to ischial tuberosity - no tenderness at iliac crest, Greater Trochanteric bursa, other portions of IT band, glutes, sacrum   LOWER EXTREMITY ROM:   ROM A: AROM; P: PROM Right eval Left eval  Hip flexion WNL WNL  Hip extension      Hip abduction      Hip adduction      Hip internal rotation Prone P: 35* Prone  P: 25*  Hip external rotation Prone P: 35* painful at PSIS Prone P: 42*  Knee flexion      Knee extension      Ankle dorsiflexion      Ankle plantarflexion      Ankle inversion      Ankle eversion       (Blank rows = not tested)   LOWER EXTREMITY MMT:   MMT Right eval Left eval  Hip flexion      Hip extension  Prone 4-/5 Prone 4/5  Hip abduction 4/5 5/5  Hip adduction      Hip internal rotation      Hip external rotation      Knee flexion      Knee extension      Ankle dorsiflexion      Ankle plantarflexion      Ankle inversion      Ankle eversion       (Blank rows = not tested)   LOWER EXTREMITY SPECIAL TESTS:  Eval: Hip special tests: Ober's test: positive Rt side but painfree 12/25/21: Stork Test - negative   FUNCTIONAL TESTS:  Single leg stance: Rt 24.78s with instability noted, Lt ended measurement at 30s   GAIT: Distance walked: >153f Assistive device utilized: None Level of assistance: Complete Independence Comments: anterior pelvic tilt with slightly flexed posture, will need education to correct     TODAY'S TREATMENT: 12/25/21 TherEx: -PT education to warm up before pickleball and stretch following activity. Pt verbalized understanding. PT updated HEP to  include quad & TFL stretches below with pt performing 1 rep ea for comprehension.  -Supine RLE quad stretch with strap x 30s -Standing RLE quad stretch x 30s -Side-lying RLE TFL/IT band stretch x 30s  Manual: -STM and skilled palpation performed during dry needling by JAnderson Malta  Hassell Done, PT, MPT  Trigger Point Dry-Needling  Treatment instructions: Expect mild to moderate muscle soreness. S/S of pneumothorax if dry needled over a lung field, and to seek immediate medical attention should they occur. Patient verbalized understanding of these instructions and education.  Dry needling performed by Kearney Hard, PT, MPT Patient Consent Given: Yes Education handout provided: Yes Muscles treated: TFL, iliopsoas  Treatment response/outcome: multiple twitch responses elicited    1/61/09 TherEx: -PT explained and demonstrated HEP items, pt verbalized understanding and returned demo supine pelvic tilt x 8 reps and each stretch 1 x 30s hold   PATIENT EDUCATION:  Education details: [id number] Person educated: Patient Education method: Explanation, Demonstration, Tactile cues, Verbal cues, and Handouts Education comprehension: verbalized understanding, returned demonstration, and verbal cues required     HOME EXERCISE PROGRAM: Access Code: [email or web address] URL: [email or web address] Date: 12/25/2021 Prepared by: [name]  Exercises - Supine Posterior Pelvic Tilt  - 1-2 x daily - 7 x weekly - 2-3 sets - 10 reps - 5 seconds hold - Supine Piriformis Stretch with Foot on Ground  - 1 x daily - 7 x weekly - 1 sets - 2-3 reps - 20-30 seconds hold - Supine Figure 4 Piriformis Stretch  - 1 x daily - 7 x weekly - 1 sets - 2-3 reps - 20-30 seconds hold - Supine Piriformis Stretch with Leg Straight  - 1 x daily - 7 x weekly - 1 sets - 2-3 reps - 20-30 seconds hold - Thomas Stretch on Table  - 1 x daily - 7 x weekly - 1 sets - 2-3 reps - 20-30 seconds hold - Seated Piriformis Stretch with  Trunk Bend  - 1 x daily - 7 x weekly - 1 sets - 2-3 reps - 20-30 seconds hold - Seated Piriformis Stretch  - 1 x daily - 7 x weekly - 1 sets - 2-3 reps - 20-30 seconds hold - Supine Quadriceps Stretch with Strap on Table  - 2-3 x daily - 7 x weekly - 1 sets - 3 reps - 30 seconds hold - Standing Quadriceps Stretch  - 2-3 x daily - 7 x weekly - 1 sets - 3 reps - 30 seconds hold - Sidelying TFL Stretch  - 2-3 x daily - 7 x weekly - 1 sets - 3 reps - 30 seconds hold   ASSESSMENT:   CLINICAL IMPRESSION: She presented with improved hip mobility/flexibility. Palpation for muscle trigger points was found at Rt TFL and iliopsoas, with some tightness in upper quadriceps. Dry needling was performed and followed up with stretching of affected muscle groups, and stretches were added to HEP. Pt reported that her hip felt better when leaving PT today. She continues to benefit from skilled PT to address flexibility and strength deficits.   OBJECTIVE IMPAIRMENTS Abnormal gait, decreased activity tolerance, decreased mobility, decreased ROM, decreased strength, impaired flexibility, postural dysfunction, and pain.    ACTIVITY LIMITATIONS standing, locomotion level, and pickleball   PARTICIPATION LIMITATIONS: community activity and exercise / recreational activities   PERSONAL FACTORS 3+ comorbidities: see PMH  are also affecting patient's functional outcome.    REHAB POTENTIAL: Good   CLINICAL DECISION MAKING: Stable/uncomplicated   EVALUATION COMPLEXITY: Low     GOALS: Goals reviewed with patient? Yes   SHORT TERM GOALS: Target date: 01/04/2022 Patient verbalized and demonstrated independence with suggested HEP to maintain progress over vacation. Baseline: see objective data Goal status: INITIAL     LONG TERM GOALS: Target date: 03/13/2022   Patient  will achieve target FOTO score >/= 87% Baseline: see objective data Goal status: INITIAL   2.  Patient reports </=1/10 pain with prolonged  standing and exercise activities to allow for maintenance of active lifestyle in community Baseline: see objective data Goal status: INITIAL   3.  Bilateral hip ROM values WNL  Baseline: see objective data Goal status: INITIAL   PLAN: PT FREQUENCY: 5 visits   PT DURATION:  5 sessions over 90 days   PLANNED INTERVENTIONS: Therapeutic exercises, Therapeutic activity, Neuromuscular re-education, Balance training, Gait training, Patient/Family education, Self Care, Joint mobilization, Dry Needling, Electrical stimulation, Cryotherapy, Moist heat, Taping, Manual therapy, Re-evaluation, and physical performance tests   PLAN FOR NEXT SESSION: another session of dry needling if indicated for sx and tightness, begin glute and abdominal stabilization exercise in closed kinetic chain  Jana Hakim, Student-PT 12/25/2021, 12:56 PM  Kearney Hard, PT, MPT 12/25/21 1:00 PM    This entire session of physical therapy was performed under the direct supervision of PT signing evaluation /treatment. PT reviewed note and agrees.  [name], PT, DPT 12/25/21, 1:06 PM

## 2021-12-28 ENCOUNTER — Other Ambulatory Visit (HOSPITAL_BASED_OUTPATIENT_CLINIC_OR_DEPARTMENT_OTHER): Payer: Self-pay | Admitting: Family

## 2022-01-01 ENCOUNTER — Ambulatory Visit: Payer: Medicare Other | Admitting: Physical Therapy

## 2022-01-01 ENCOUNTER — Encounter: Payer: Self-pay | Admitting: Physical Therapy

## 2022-01-01 DIAGNOSIS — M25651 Stiffness of right hip, not elsewhere classified: Secondary | ICD-10-CM

## 2022-01-01 DIAGNOSIS — M25652 Stiffness of left hip, not elsewhere classified: Secondary | ICD-10-CM | POA: Diagnosis not present

## 2022-01-01 DIAGNOSIS — M6281 Muscle weakness (generalized): Secondary | ICD-10-CM

## 2022-01-01 DIAGNOSIS — M25551 Pain in right hip: Secondary | ICD-10-CM | POA: Diagnosis not present

## 2022-01-01 NOTE — Therapy (Signed)
OUTPATIENT PHYSICAL THERAPY TREATMENT NOTE   Patient Name: [name] [name] MRN: [id number] DOB:[birth year], 66 y.o., female Today's Date: 01/01/2022  PCP: Midge Minium, MD REFERRING PROVIDER: Mcarthur Rossetti, MD  END OF SESSION:   PT End of Session - 01/01/22 1105     Visit Number 3    Number of Visits 5    Date for PT Re-Evaluation 03/13/22    Authorization Type [employer] MEDICARE    Authorization Time Period [employer] MEDICARE    Progress Note Due on Visit 10    PT Start Time 1103    PT Stop Time 1144    PT Time Calculation (min) 41 min    Activity Tolerance Patient tolerated treatment well    Behavior During Therapy [facility] for tasks assessed/performed              Past Medical History:  Diagnosis Date   Cancer ([place]) 2016   right breast cancer   Cancer of right breast ([place])    last radiation was 01/2015   Dysrhythmia    no problems since ablation 2004   Lipoma of abdominal wall    OSA (obstructive sleep apnea) 04/17/2016   Paroxysmal SVT (supraventricular tachycardia) ([place]) 2004   Status post RFA ABLATION   S/P radiation therapy 12/28/2014 through 01/27/2015                                                     Right breast 4250 cGy 17 sessions, right breast boost 1200 cGy in 6 sessions                         Past Surgical History:  Procedure Laterality Date   ABDOMINAL HYSTERECTOMY  1999   WITH BLADDER REPAIR FOR PROPLAPSE-HAS OVARIES (NO CA)   BREAST LUMPECTOMY WITH RADIOACTIVE SEED AND SENTINEL LYMPH NODE BIOPSY Right 11/02/2014   Procedure: RIGHT BREAST LUMPECTOMY WITH RADIOACTIVE SEED AND RIGHT AXILLARY SENTINEL LYMPH NODE BIOPSY;  Surgeon: Excell Seltzer, MD;  Location: [place];  Service: General;  Laterality: Right;   COLONOSCOPY     KNEE CARTILAGE SURGERY     LIPOMA EXCISION N/A 03/23/2015   Procedure: EXCISION LIPOMA ABDOMINAL WALLL;  Surgeon: Excell Seltzer, MD;  Location: [place];  Service: General;   Laterality: N/A;   STAPEDECTOMY Right    SVT ablation  2000   Patient Active Problem List   Diagnosis Date Noted   Hyperlipidemia 03/25/2018   Vitamin D deficiency 03/19/2017   Status post arthroscopy of left knee 03/07/2017   OSA (obstructive sleep apnea) 04/17/2016   Polyarthralgia 12/29/2015   Physical exam 11/02/2015   Primary cancer of upper outer quadrant of right breast ([place]) 10/22/2014   Palpitations 12/25/2012    REFERRING DIAG: M25.551 (ICD-10-CM) - Pain of right hip  THERAPY DIAG:  Pain in right hip  Stiffness of right hip, not elsewhere classified  Stiffness of left hip, not elsewhere classified  Muscle weakness (generalized)  Rationale for Evaluation and Treatment Rehabilitation  PERTINENT HISTORY: Right breast cancer lumpectomy 10/2014 last radiation 01/2015, upper right quadrant abdominal wall lipoma excision 02/2015, supraventricular tachycardia with ablation 2004, atrial tachycardia with PVCs and PACs noted 08/2021  PRECAUTIONS: None  SUBJECTIVE: She felt great after last session, and enjoyed the dry needling. She remains active with  swimming 4x/week with no pain but remembered that she started pain once began swimming. She played pickleball on Friday and felt good after. She has no issues with activities or housework. Her current complaint is standing from sitting, especially after longer car rides, and waiting and walking usually decreases anterior hip pain after 5 steps.  PAIN:  Are you having pain? 0/10 seated but up to 6/10 when first stands up esp from car   OBJECTIVE: (objective measures completed at initial evaluation unless otherwise dated) DIAGNOSTIC FINDINGS: 12/11/21: An AP pelvis and lateral both hips shows well-maintained joint space with no significant findings.  The arthritis is minimal.  The AP view standing of her hips does show the lower aspect of her lumbar spine which there is is some arthritic changes.   PATIENT SURVEYS:  FOTO 12/11/21  85%, risk adjusted score 60%, target 87%   COGNITION: Overall cognitive status: Within functional limits for tasks assessed                          SENSATION: Light touch: WFL   MUSCLE LENGTH: Eval: Thomas test: Right -14 deg; Left -10 deg 12/25/21: Marcello Moores test: WNL bil. to table contact   POSTURE: 12/13/2021: increased lumbar lordosis, anterior pelvic tilt, and right pelvis higher   PALPATION: Right upper quadrant Soft tissue seems to be adhered to lower ribs Palpated anterior pelvic tilt and slight Rt sided pelvis higher Tenderness at origin of piriformis and one location on IT band distal to ischial tuberosity - no tenderness at iliac crest, Greater Trochanteric bursa, other portions of IT band, glutes, sacrum   LOWER EXTREMITY ROM:   ROM A: AROM; P: PROM Right eval Left eval  Hip flexion WNL WNL  Hip extension      Hip abduction      Hip adduction      Hip internal rotation Prone P: 35* Prone  P: 25*  Hip external rotation Prone P: 35* painful at PSIS Prone P: 42*  Knee flexion      Knee extension      Ankle dorsiflexion      Ankle plantarflexion      Ankle inversion      Ankle eversion       (Blank rows = not tested)   LOWER EXTREMITY MMT:   MMT Right eval Left eval Right 01/01/22 Left 01/01/22  Hip flexion        Hip extension  Prone 4-/5 Prone 4/5    Hip abduction 4/5 5/5    Hip adduction        Hip internal rotation        Hip external rotation        Sartorius   4/5 3+/5  Knee flexion        Knee extension        Ankle dorsiflexion        Ankle plantarflexion        Ankle inversion        Ankle eversion         (Blank rows = not tested)   LOWER EXTREMITY SPECIAL TESTS:  Eval: Hip special tests: Ober's test: positive Rt side but painfree 12/25/21: Stork Test - negative   FUNCTIONAL TESTS:  Single leg stance: Rt 24.78s with instability noted, Lt ended measurement at 30s   GAIT: Distance walked: >139f Assistive device utilized: None Level of  assistance: Complete Independence Comments: anterior pelvic tilt with slightly flexed posture, will  need education to correct     TODAY'S TREATMENT: 01/01/22 TherEx: -Side-lying TFL/IT band stretch 2 x 30s -Supine bridges x 10 with cueing for form and breathing -PT educated on swimming warm up/cool down and 3 stroke breathing pattern to reduce unilateral tightness, pt verbalized understanding  Self-care -Standing self-STM to piriformis with tennis ball on wall -Seated self-STM to piriformis, pt didn't feel as much relief as in standing -PT educated on hip ER block during car rides to prevent piriformis tightening, pt verbalized understanding  Manual: -STM and skilled palpation performed during dry needling by Kearney Hard, PT, MPT  Trigger Point Dry-Needling  Treatment instructions: Expect mild to moderate muscle soreness. S/S of pneumothorax if dry needled over a lung field, and to seek immediate medical attention should they occur. Patient verbalized understanding of these instructions and education.  Dry needling performed by Kearney Hard, PT, MPT Patient Consent Given: Yes Education handout provided: Yes Muscles treated: TFL Treatment response/outcome: multiple twitch responses elicited   01/25/50 TherEx: -PT education to warm up before pickleball and stretch following activity. Pt verbalized understanding. PT updated HEP to include quad & TFL stretches below with pt performing 1 rep ea for comprehension.  -Supine RLE quad stretch with strap x 30s -Standing RLE quad stretch x 30s -Side-lying RLE TFL/IT band stretch x 30s  Manual: -STM and skilled palpation performed during dry needling by Kearney Hard, PT, MPT  Trigger Point Dry-Needling  Treatment instructions: Expect mild to moderate muscle soreness. S/S of pneumothorax if dry needled over a lung field, and to seek immediate medical attention should they occur. Patient verbalized understanding of these instructions  and education.  Dry needling performed by Kearney Hard, PT, MPT Patient Consent Given: Yes Education handout provided: Yes Muscles treated: TFL, iliopsoas  Treatment response/outcome: multiple twitch responses elicited    7/61/60 TherEx: -PT explained and demonstrated HEP items, pt verbalized understanding and returned demo supine pelvic tilt x 8 reps and each stretch 1 x 30s hold   PATIENT EDUCATION:  Education details: [id number] Person educated: Patient Education method: Explanation, Demonstration, Tactile cues, Verbal cues, and Handouts Education comprehension: verbalized understanding, returned demonstration, and verbal cues required     HOME EXERCISE PROGRAM: Access Code: [email or web address] URL: [email or web address] Date: 12/25/2021 Prepared by: [name]  Exercises - Supine Posterior Pelvic Tilt  - 1-2 x daily - 7 x weekly - 2-3 sets - 10 reps - 5 seconds hold - Supine Piriformis Stretch with Foot on Ground  - 1 x daily - 7 x weekly - 1 sets - 2-3 reps - 20-30 seconds hold - Supine Figure 4 Piriformis Stretch  - 1 x daily - 7 x weekly - 1 sets - 2-3 reps - 20-30 seconds hold - Supine Piriformis Stretch with Leg Straight  - 1 x daily - 7 x weekly - 1 sets - 2-3 reps - 20-30 seconds hold - Thomas Stretch on Table  - 1 x daily - 7 x weekly - 1 sets - 2-3 reps - 20-30 seconds hold - Seated Piriformis Stretch with Trunk Bend  - 1 x daily - 7 x weekly - 1 sets - 2-3 reps - 20-30 seconds hold - Seated Piriformis Stretch  - 1 x daily - 7 x weekly - 1 sets - 2-3 reps - 20-30 seconds hold - Supine Quadriceps Stretch with Strap on Table  - 2-3 x daily - 7 x weekly - 1 sets - 3 reps - 30 seconds hold - Standing Quadriceps Stretch  -  2-3 x daily - 7 x weekly - 1 sets - 3 reps - 30 seconds hold - Sidelying TFL Stretch  - 2-3 x daily - 7 x weekly - 1 sets - 3 reps - 30 seconds hold   ASSESSMENT:   CLINICAL IMPRESSION: She presents with almost no limitation to functional or  recreational activity, with the current primary complaint of intense anterior hip pain upon standing from sitting. She had multiple trigger points at Rt TFL and piriformis, with dry needling performed on TFL and pt reported relief following intervention. She continues to benefit from skilled PT.   OBJECTIVE IMPAIRMENTS Abnormal gait, decreased activity tolerance, decreased mobility, decreased ROM, decreased strength, impaired flexibility, postural dysfunction, and pain.    ACTIVITY LIMITATIONS standing, locomotion level, and pickleball   PARTICIPATION LIMITATIONS: community activity and exercise / recreational activities   PERSONAL FACTORS 3+ comorbidities: see PMH  are also affecting patient's functional outcome.    REHAB POTENTIAL: Good   CLINICAL DECISION MAKING: Stable/uncomplicated   EVALUATION COMPLEXITY: Low     GOALS: Goals reviewed with patient? Yes   SHORT TERM GOALS: Target date: 01/04/2022 Patient verbalized and demonstrated independence with suggested HEP to maintain progress over vacation. Baseline: see objective data Goal status: met 01/01/22     LONG TERM GOALS: Target date: 03/13/2022   Patient will achieve target FOTO score >/= 87% Baseline: see objective data Goal status: INITIAL   2.  Patient reports </=1/10 pain with prolonged standing and exercise activities to allow for maintenance of active lifestyle in community Baseline: see objective data Goal status: INITIAL   3.  Bilateral hip ROM values WNL  Baseline: see objective data Goal status: INITIAL   PLAN: PT FREQUENCY: 5 visits   PT DURATION:  5 sessions over 90 days   PLANNED INTERVENTIONS: Therapeutic exercises, Therapeutic activity, Neuromuscular re-education, Balance training, Gait training, Patient/Family education, Self Care, Joint mobilization, Dry Needling, Electrical stimulation, Cryotherapy, Moist heat, Taping, Manual therapy, Re-evaluation, and physical performance tests   PLAN FOR NEXT  SESSION: dry needling to TFL or piriformis/glute med if indicated for sx and tightness, continue glute and abdominal stabilization exercise in closed kinetic chain, advise on recommendations during long flight to [place] to decrease hip tightening.   Jana Hakim, Student-PT 01/01/2022, 3:57 PM  Kearney Hard, PT, MPT 01/01/22 4:11 PM    This entire session of physical therapy was performed under the direct supervision of PT signing evaluation /treatment. PT reviewed note and agrees.  [name], PT, DPT 01/01/22, 4:17 PM

## 2022-01-02 ENCOUNTER — Encounter: Payer: Self-pay | Admitting: Hematology and Oncology

## 2022-01-02 DIAGNOSIS — Z780 Asymptomatic menopausal state: Secondary | ICD-10-CM | POA: Diagnosis not present

## 2022-01-02 DIAGNOSIS — M8588 Other specified disorders of bone density and structure, other site: Secondary | ICD-10-CM | POA: Diagnosis not present

## 2022-01-03 ENCOUNTER — Telehealth: Payer: Self-pay | Admitting: *Deleted

## 2022-01-03 NOTE — Telephone Encounter (Signed)
RN attempt x1 to contact pt regarding recent bone density report from [facility] showing T-score -1.2.  No answer, LVM for pt to return call to the office.

## 2022-01-08 ENCOUNTER — Encounter: Payer: Self-pay | Admitting: Physical Therapy

## 2022-01-08 ENCOUNTER — Ambulatory Visit: Payer: Medicare Other | Admitting: Physical Therapy

## 2022-01-08 ENCOUNTER — Ambulatory Visit: Payer: Medicare Other | Admitting: Physician Assistant

## 2022-01-08 DIAGNOSIS — M25651 Stiffness of right hip, not elsewhere classified: Secondary | ICD-10-CM

## 2022-01-08 DIAGNOSIS — M25551 Pain in right hip: Secondary | ICD-10-CM

## 2022-01-08 DIAGNOSIS — M25652 Stiffness of left hip, not elsewhere classified: Secondary | ICD-10-CM | POA: Diagnosis not present

## 2022-01-08 DIAGNOSIS — M6281 Muscle weakness (generalized): Secondary | ICD-10-CM

## 2022-01-08 NOTE — Therapy (Addendum)
OUTPATIENT PHYSICAL THERAPY TREATMENT NOTE & DISCHARGE SUMMARY (04/03/2022)   Patient Name: [name] [name] MRN: [id number] DOB:04/18/1956, 66 y.o., female Today's Date: 01/08/2022  PCP: Midge Minium, MD REFERRING PROVIDER: Mcarthur Rossetti, MD    PHYSICAL THERAPY DISCHARGE SUMMARY (ADDENDUM 04/03/22 FOR DC)  Visits from Start of Care: 4  Current functional level related to goals / functional outcomes: Unable to determine after 01/08/2022 as did not return to PT.    Remaining deficits: Unable to determine after 01/08/2022 as did not return to PT.    Education / Equipment: HEP   Patient agrees to discharge. Patient goals were unable to determine if met. Patient is being discharged due to not returning since the last visit.   [name], PT 04/03/2022, 12:31 PM     END OF SESSION:   PT End of Session - 01/08/22 1149     Visit Number 4    Number of Visits 5    Date for PT Re-Evaluation 03/13/22    Authorization Type [employer] MEDICARE    Authorization Time Period [employer] MEDICARE    Progress Note Due on Visit 10    PT Start Time 1146    PT Stop Time 1220    PT Time Calculation (min) 34 min    Activity Tolerance Patient tolerated treatment well    Behavior During Therapy WFL for tasks assessed/performed               Past Medical History:  Diagnosis Date   Cancer ([place]) 2016   right breast cancer   Cancer of right breast ([place])    last radiation was 01/2015   Dysrhythmia    no problems since ablation 2004   Lipoma of abdominal wall    OSA (obstructive sleep apnea) 04/17/2016   Paroxysmal SVT (supraventricular tachycardia) ([place]) 2004   Status post RFA ABLATION   S/P radiation therapy 12/28/2014 through 01/27/2015                                                     Right breast 4250 cGy 17 sessions, right breast boost 1200 cGy in 6 sessions                         Past Surgical History:  Procedure Laterality Date   ABDOMINAL  HYSTERECTOMY  1999   WITH BLADDER REPAIR FOR PROPLAPSE-HAS OVARIES (NO CA)   BREAST LUMPECTOMY WITH RADIOACTIVE SEED AND SENTINEL LYMPH NODE BIOPSY Right 11/02/2014   Procedure: RIGHT BREAST LUMPECTOMY WITH RADIOACTIVE SEED AND RIGHT AXILLARY SENTINEL LYMPH NODE BIOPSY;  Surgeon: Excell Seltzer, MD;  Location: [facility];  Service: General;  Laterality: Right;   COLONOSCOPY     KNEE CARTILAGE SURGERY     LIPOMA EXCISION N/A 03/23/2015   Procedure: [place] WALLL;  Surgeon: Excell Seltzer, MD;  Location: [facility];  Service: General;  Laterality: N/A;   STAPEDECTOMY Right    SVT ablation  2000   Patient Active Problem List   Diagnosis Date Noted   Hyperlipidemia 03/25/2018   Vitamin D deficiency 03/19/2017   Status post arthroscopy of left knee 03/07/2017   OSA (obstructive sleep apnea) 04/17/2016   Polyarthralgia 12/29/2015   Physical exam 11/02/2015   Primary cancer of upper outer quadrant of right breast ([name] [name])  10/22/2014   Palpitations 12/25/2012    REFERRING DIAG: M25.551 (ICD-10-CM) - Pain of right hip  THERAPY DIAG:  Pain in right hip  Stiffness of right hip, not elsewhere classified  Stiffness of left hip, not elsewhere classified  Muscle weakness (generalized)  Rationale for Evaluation and Treatment Rehabilitation  PERTINENT HISTORY: Right breast cancer lumpectomy 10/2014 last radiation 01/2015, upper right quadrant abdominal wall lipoma excision 02/2015, supraventricular tachycardia with ablation 2004, atrial tachycardia with PVCs and PACs noted 08/2021  PRECAUTIONS: None  SUBJECTIVE: the ball in car & on wall seemed to have helped. She videotaped swimming including exercises.  She leaves in 2 days for [place] with flight total 28-30 hours with longest leg 15 hours.   PAIN:  Are you having pain?  0/10 seated but up to 2/10 when first stands up esp from car   OBJECTIVE: (objective measures completed at initial  evaluation unless otherwise dated) DIAGNOSTIC FINDINGS: 12/11/21: An AP pelvis and lateral both hips shows well-maintained joint space with no significant findings.  The arthritis is minimal.  The AP view standing of her hips does show the lower aspect of her lumbar spine which there is is some arthritic changes.   PATIENT SURVEYS:  FOTO 12/11/21 85%, risk adjusted score 60%, target 87%   COGNITION: Overall cognitive status: Within functional limits for tasks assessed                          SENSATION: Light touch: WFL   MUSCLE LENGTH: 01/08/2022:  Marcello Moores Test Right 0* with left knee to chest Eval: Marcello Moores test: Right -14 deg; Left -10 deg 12/25/21: Marcello Moores test: WNL bil. to table contact   POSTURE: 12/13/2021: increased lumbar lordosis, anterior pelvic tilt, and right pelvis higher   PALPATION: Right upper quadrant Soft tissue seems to be adhered to lower ribs Palpated anterior pelvic tilt and slight Rt sided pelvis higher Tenderness at origin of piriformis and one location on IT band distal to ischial tuberosity - no tenderness at iliac crest, Greater Trochanteric bursa, other portions of IT band, glutes, sacrum   LOWER EXTREMITY ROM:   ROM A: AROM; P: PROM Right eval Left eval  Hip flexion WNL WNL  Hip extension      Hip abduction      Hip adduction      Hip internal rotation Prone P: 35* Prone  P: 25*  Hip external rotation Prone P: 35* painful at PSIS Prone P: 42*  Knee flexion      Knee extension      Ankle dorsiflexion      Ankle plantarflexion      Ankle inversion      Ankle eversion       (Blank rows = not tested)   LOWER EXTREMITY MMT:   MMT Right eval Left eval Right 01/01/22 Left 01/01/22  Hip flexion        Hip extension  Prone 4-/5 Prone 4/5    Hip abduction 4/5 5/5    Hip adduction        Hip internal rotation        Hip external rotation        Sartorius   4/5 3+/5  Knee flexion        Knee extension        Ankle dorsiflexion        Ankle  plantarflexion        Ankle inversion  Ankle eversion         (Blank rows = not tested)   LOWER EXTREMITY SPECIAL TESTS:  Eval: Hip special tests: Ober's test: positive Rt side but painfree 12/25/21: Stork Test - negative   FUNCTIONAL TESTS:  Single leg stance: Rt 24.78s with instability noted, Lt ended measurement at 30s   GAIT: Distance walked: >167f Assistive device utilized: None Level of assistance: Complete Independence Comments: anterior pelvic tilt with slightly flexed posture, will need education to correct     TODAY'S TREATMENT: 01/08/2022 Therapeutic Exercise: PT reviewed videos of pool exercise of supine trunk rotation which appeared correct form. PT did recommend standing with feet on bottom of pool with trunk ext to come out of flexed posture. Pt verbalized understanding. PT watched video of swimming freestyle with breathing over right side only.  She reports issues coordinating breathing left side. PT demo & verbal cues on working on breathing left side standing in hip depth water with trunk flexed working on left side breathing with arm motions only. Pt verbalized understanding.  Standing hip flexor stretch in straddle position 30 sec hold 2 reps. PT demo & verbal cues prior and pt verbalized / return demo understanding. Seated hip ext at edge of chair with glut set 5 sec 10 reps.  PT demo & instruction in exercise for plane to limit prolonged flexion.  Supine bridge 5 sec hold 10 reps 1 set; progressed to bridge with single leg stabilization bridge with sliding LE out with hip ext alt which LE slides for RLE hip stretch then stabilization 5 reps ea LE;   bridge with hip abd/add 10 reps.   Pt verbalized understanding of above exercises.   Manual: -STM and skilled palpation performed during dry needling by MScot Jun PT, DPT, OCS, ATC  Trigger Point Dry-Needling  Treatment instructions: Expect mild to moderate muscle soreness. S/S of pneumothorax if dry  needled over a lung field, and to seek immediate medical attention should they occur. Patient verbalized understanding of these instructions and education.  Dry needling performed by MScot Jun PT, DPT, OCS, ATC Patient Consent Given: Yes Education handout provided: Yes Muscles treated: Rt TFL, Rt anterior aspects of glute med Treatment response/outcome: concordant localized symptoms c twitch response  01/01/22 TherEx: -Side-lying TFL/IT band stretch 2 x 30s -Supine bridges x 10 with cueing for form and breathing -PT educated on swimming warm up/cool down and 3 stroke breathing pattern to reduce unilateral tightness, pt verbalized understanding  Self-care -Standing self-STM to piriformis with tennis ball on wall -Seated self-STM to piriformis, pt didn't feel as much relief as in standing -PT educated on hip ER block during car rides to prevent piriformis tightening, pt verbalized understanding  Manual: -STM and skilled palpation performed during dry needling by JKearney Hard PT, MPT  Trigger Point Dry-Needling  Treatment instructions: Expect mild to moderate muscle soreness. S/S of pneumothorax if dry needled over a lung field, and to seek immediate medical attention should they occur. Patient verbalized understanding of these instructions and education.  Dry needling performed by JKearney Hard PT, MPT Patient Consent Given: Yes Education handout provided: Yes Muscles treated: TFL Treatment response/outcome: multiple twitch responses elicited   72/59/56TherEx: -PT education to warm up before pickleball and stretch following activity. Pt verbalized understanding. PT updated HEP to include quad & TFL stretches below with pt performing 1 rep ea for comprehension.  -Supine RLE quad stretch with strap x 30s -Standing RLE quad stretch x 30s -Side-lying RLE TFL/IT band stretch  x 30s  Manual: -STM and skilled palpation performed during dry needling by Kearney Hard, PT,  MPT  Trigger Point Dry-Needling  Treatment instructions: Expect mild to moderate muscle soreness. S/S of pneumothorax if dry needled over a lung field, and to seek immediate medical attention should they occur. Patient verbalized understanding of these instructions and education.  Dry needling performed by Kearney Hard, PT, MPT Patient Consent Given: Yes Education handout provided: Yes Muscles treated: TFL, iliopsoas  Treatment response/outcome: multiple twitch responses elicited      PATIENT EDUCATION:  Education details: [id number] Person educated: Patient Education method: Explanation, Demonstration, Tactile cues, Verbal cues, and Handouts Education comprehension: verbalized understanding, returned demonstration, and verbal cues required     HOME EXERCISE PROGRAM: Access Code: [email or web address] URL: [email or web address] Date: 12/25/2021 Prepared by: [name]  Exercises - Supine Posterior Pelvic Tilt  - 1-2 x daily - 7 x weekly - 2-3 sets - 10 reps - 5 seconds hold - Supine Piriformis Stretch with Foot on Ground  - 1 x daily - 7 x weekly - 1 sets - 2-3 reps - 20-30 seconds hold - Supine Figure 4 Piriformis Stretch  - 1 x daily - 7 x weekly - 1 sets - 2-3 reps - 20-30 seconds hold - Supine Piriformis Stretch with Leg Straight  - 1 x daily - 7 x weekly - 1 sets - 2-3 reps - 20-30 seconds hold - Thomas Stretch on Table  - 1 x daily - 7 x weekly - 1 sets - 2-3 reps - 20-30 seconds hold - Seated Piriformis Stretch with Trunk Bend  - 1 x daily - 7 x weekly - 1 sets - 2-3 reps - 20-30 seconds hold - Seated Piriformis Stretch  - 1 x daily - 7 x weekly - 1 sets - 2-3 reps - 20-30 seconds hold - Supine Quadriceps Stretch with Strap on Table  - 2-3 x daily - 7 x weekly - 1 sets - 3 reps - 30 seconds hold - Standing Quadriceps Stretch  - 2-3 x daily - 7 x weekly - 1 sets - 3 reps - 30 seconds hold - Sidelying TFL Stretch  - 2-3 x daily - 7 x weekly - 1 sets - 3 reps - 30 seconds  hold   ASSESSMENT:/   CLINICAL IMPRESSION: Patient's hip pain appears to be resolving.  She understands HEP including updated exercises today.  She is scheduled to go to [place] in 2 days for 6.5 weeks.  PT will reassess upon return prior to Dr. Ninfa [place] appt on 03/05/22.   OBJECTIVE IMPAIRMENTS Abnormal gait, decreased activity tolerance, decreased mobility, decreased ROM, decreased strength, impaired flexibility, postural dysfunction, and pain.    ACTIVITY LIMITATIONS standing, locomotion level, and pickleball   PARTICIPATION LIMITATIONS: community activity and exercise / recreational activities   PERSONAL FACTORS 3+ comorbidities: see PMH  are also affecting patient's functional outcome.    REHAB POTENTIAL: Good   CLINICAL DECISION MAKING: Stable/uncomplicated   EVALUATION COMPLEXITY: Low     GOALS: Goals reviewed with patient? Yes   SHORT TERM GOALS: Target date: 01/04/2022 Patient verbalized and demonstrated independence with suggested HEP to maintain progress over vacation. Baseline: see objective data Goal status: met 01/01/22     LONG TERM GOALS: Target date: 03/13/2022   Patient will achieve target FOTO score >/= 87% Baseline: see objective data Goal status: INITIAL   2.  Patient reports </=1/10 pain with prolonged standing and exercise activities to allow for maintenance of active lifestyle in  community Baseline: see objective data Goal status: INITIAL   3.  Bilateral hip ROM values WNL  Baseline: see objective data Goal status: INITIAL   PLAN: PT FREQUENCY: 5 visits   PT DURATION:  5 sessions over 90 days   PLANNED INTERVENTIONS: Therapeutic exercises, Therapeutic activity, Neuromuscular re-education, Balance training, Gait training, Patient/Family education, Self Care, Joint mobilization, Dry Needling, Electrical stimulation, Cryotherapy, Moist heat, Taping, Manual therapy, Re-evaluation, and physical performance tests   PLAN FOR NEXT SESSION: assess hip  issues over last 7 weeks with her trip to [place]. If resolved then discharge. If not resolved, then recertify.    [name], PT, DPT 01/08/2022, 12:33 PM

## 2022-02-20 ENCOUNTER — Ambulatory Visit: Payer: Medicare Other | Admitting: Cardiology

## 2022-02-28 ENCOUNTER — Encounter: Payer: Self-pay | Admitting: Cardiology

## 2022-02-28 ENCOUNTER — Ambulatory Visit: Payer: Medicare Other | Attending: Cardiology | Admitting: Cardiology

## 2022-02-28 VITALS — BP 132/76 | HR 70 | Ht 69.0 in | Wt 183.6 lb

## 2022-02-28 DIAGNOSIS — I471 Supraventricular tachycardia, unspecified: Secondary | ICD-10-CM | POA: Diagnosis not present

## 2022-02-28 MED ORDER — METOPROLOL SUCCINATE ER 25 MG PO TB24: 12.5000 mg | ORAL_TABLET | Freq: Every day | ORAL | 3 refills | Status: DC

## 2022-02-28 MED ORDER — METOPROLOL SUCCINATE ER 25 MG PO TB24: 25.0000 mg | ORAL_TABLET | Freq: Every day | ORAL | 3 refills | Status: DC

## 2022-02-28 NOTE — Progress Notes (Signed)
Electrophysiology Office Note   Date:  02/28/2022   ID:  [name] [name], DOB 05/28/1956, MRN [id number]  PCP:  Midge Minium, MD  Cardiologist: Oval Linsey Primary Electrophysiologist:  Kasidee Voisin Meredith Leeds, MD    Chief Complaint: SVT   History of Present Illness: [name] [name] is a 66 y.o. female who is being seen today for the evaluation of SVT at the request of Birdie Riddle Aundra Millet, MD. Presenting today for electrophysiology evaluation.  She has a history significant for SVT and breast she had SVT ablation in 2004.  She was having intermittent palpitations last summer, but a monitor showed no arrhythmia and only 4 beat episodes of SVT.  Today, she denies symptoms of chest pain, shortness of breath, orthopnea, PND, lower extremity edema, claudication, dizziness, presyncope, syncope, bleeding, or neurologic sequela. The patient is tolerating medications without difficulties.  She is continue to have episodic palpitations.  Palpitations occur multiple times a week.  They are generally short-lived.  She says that she feels weak and fatigued when she has them.  She states that most commonly they occur when she is about to take her dose of metoprolol in the morning.  She is also concerned about her cardiac risk.  She would like a coronary calcium score.   Past Medical History:  Diagnosis Date   Cancer [facility]) 2016   right breast cancer   Cancer of right breast ([place])    last radiation was 01/2015   Dysrhythmia    no problems since ablation 2004   Lipoma of abdominal wall    OSA (obstructive sleep apnea) 04/17/2016   Paroxysmal SVT (supraventricular tachycardia) 2004   Status post RFA ABLATION   S/P radiation therapy 12/28/2014 through 01/27/2015                                                     Right breast 4250 cGy 17 sessions, right breast boost 1200 cGy in 6 sessions                         Past Surgical History:  Procedure Laterality Date   ABDOMINAL HYSTERECTOMY  1999    WITH BLADDER REPAIR FOR PROPLAPSE-HAS OVARIES (NO CA)   BREAST LUMPECTOMY WITH RADIOACTIVE SEED AND SENTINEL LYMPH NODE BIOPSY Right 11/02/2014   Procedure: RIGHT BREAST LUMPECTOMY WITH RADIOACTIVE SEED AND RIGHT AXILLARY SENTINEL LYMPH NODE BIOPSY;  Surgeon: Excell Seltzer, MD;  Location: [facility];  Service: General;  Laterality: Right;   COLONOSCOPY     KNEE CARTILAGE SURGERY     LIPOMA EXCISION N/A 03/23/2015   Procedure: [place] WALLL;  Surgeon: Excell Seltzer, MD;  Location: [facility];  Service: General;  Laterality: N/A;   STAPEDECTOMY Right    SVT ablation  2000     Current Outpatient Medications  Medication Sig Dispense Refill   Cholecalciferol (VITAMIN D3) 2000 units TABS Take 2,000 Units by mouth daily.     vitamin E 1000 UNIT capsule Take 1,000 Units by mouth daily.     metoprolol succinate (TOPROL XL) 25 MG 24 hr tablet Take 0.5 tablets (12.5 mg total) by mouth daily. 45 tablet 3   No current facility-administered medications for this visit.    Allergies:   Patient has no known allergies.   Social History:  The patient  reports that she quit smoking about 44 years ago. Her smoking use included cigarettes. She has a 12.00 pack-year smoking history. She has never used smokeless tobacco. She reports current alcohol use of about 4.0 - 5.0 standard drinks of alcohol per week. She reports that she does not use drugs.   Family History:  The patient's family history includes Acute lymphoblastic leukemia in her paternal grandfather; Bladder Cancer in her father; CVA in her maternal grandmother and mother; Heart attack in her maternal grandfather; Leukemia in her paternal grandfather; Lung cancer in her paternal aunt, paternal uncle, and paternal uncle; Ovarian cancer in her paternal aunt; Supraventricular tachycardia in her brother, grandchild, sister, and son.    ROS:  Please see the history of present illness.   Otherwise, review  of systems is positive for none.   All other systems are reviewed and negative.    PHYSICAL EXAM: VS:  BP 132/76   Pulse 70   Ht '5\' 9"'$  (1.753 m)   Wt 183 lb 9.6 oz (83.3 kg)   SpO2 95%   BMI 27.11 kg/m  , BMI Body mass index is 27.11 kg/m. GEN: Well nourished, well developed, in no acute distress  HEENT: normal  Neck: no JVD, carotid bruits, or masses Cardiac: RRR; no murmurs, rubs, or gallops,no edema  Respiratory:  clear to auscultation bilaterally, normal work of breathing GI: soft, nontender, nondistended, + BS MS: no deformity or atrophy  Skin: warm and dry Neuro:  Strength and sensation are intact Psych: euthymic mood, full affect  EKG:  EKG is ordered today. Personal review of the ekg ordered shows sinus rhythm, rate 70  Recent Labs: 05/16/2021: ALT 12; BUN 15; Creatinine, Ser 0.69; Hemoglobin 13.2; Platelets 194.0; Potassium 4.0; Sodium 137; TSH 2.76    Lipid Panel     Component Value Date/Time   CHOL 237 (H) 05/16/2021 0812   TRIG 142.0 05/16/2021 0812   HDL 70.40 05/16/2021 0812   CHOLHDL 3 05/16/2021 0812   VLDL 28.4 05/16/2021 0812   LDLCALC 139 (H) 05/16/2021 0812     Wt Readings from Last 3 Encounters:  02/28/22 183 lb 9.6 oz (83.3 kg)  08/29/21 179 lb (81.2 kg)  08/10/21 183 lb 1.6 oz (83.1 kg)      Other studies Reviewed: Additional studies/ records that were reviewed today include: Cardiac monitor 01/10/2021 personally reviewed Review of the above records today demonstrates:  Quality: Fair.  Baseline artifact. Predominant rhythm: sinus rhythm Average heart rate: 71 bpm Max heart rate: 139 bpm Min heart rate: 47 bpm   7 runs of SVT up to 4 beat.  Maximum heart rate in SVT 200 bpm. Rare PVCs and PACs.   ASSESSMENT AND PLAN:  1.  SVT: Likely due to an atrial tachycardia.  Currently on Toprol-XL 25 mg daily.  Continue to have palpitations that occur mainly when the metoprolol is washing out.  She Branden Shallenberger cut the pill in half and take it twice a  day which may help.  I have also told her that she can take 1 whole tab twice a day to see if this gives her any benefit.  Of note, she has a strong family history of heart disease.  We Manus Weedman plan for coronary calcium scoring.    Current medicines are reviewed at length with the patient today.   The patient does not have concerns regarding her medicines.  The following changes were made today:  none  Labs/ tests ordered today include:  Orders Placed This Encounter  Procedures   CT CARDIAC SCORING (SELF PAY ONLY)   EKG 12-Lead     Disposition:   FU 6 months  Signed, Coreon Simkins Meredith Leeds, MD  02/28/2022 2:51 PM     [facility] [address] [place] [address] [phone] (office) [phone] (fax)

## 2022-02-28 NOTE — Patient Instructions (Addendum)
Medication Instructions:  Your physician has recommended you make the following change in your medication:  1) DECREASE metoprolol to 12.5 mg (1/2 tablet) twice daily  *If you need a refill on your cardiac medications before your next appointment, please call your pharmacy*  Testing/Procedures: Your physician has requested that you have a calcium score CT scan. There is a $99 fee for the scan.   Follow-Up: At [facility], you and your health needs are our priority.  As part of our continuing mission to provide you with exceptional heart care, we have created designated Provider Care Teams.  These Care Teams include your primary Cardiologist (physician) and Advanced Practice Providers (APPs -  Physician Assistants and Nurse Practitioners) who all work together to provide you with the care you need, when you need it.  Your next appointment:   6 month(s)  The format for your next appointment:   In Person  Provider:   You will see one of the following Advanced Practice Providers on your designated Care Team:   Tommye Standard, [place] Legrand Como "Jonni Sanger" Chalmers Cater, [place]   Important Information About Sugar

## 2022-03-05 ENCOUNTER — Ambulatory Visit: Payer: Medicare Other | Admitting: Physician Assistant

## 2022-03-07 ENCOUNTER — Ambulatory Visit (HOSPITAL_BASED_OUTPATIENT_CLINIC_OR_DEPARTMENT_OTHER)
Admission: RE | Admit: 2022-03-07 | Discharge: 2022-03-07 | Disposition: A | Discharge status: Home / Self Care | Payer: Medicare Other | Source: Ambulatory Visit | Attending: Cardiology | Admitting: Cardiology

## 2022-03-07 DIAGNOSIS — I471 Supraventricular tachycardia, unspecified: Secondary | ICD-10-CM | POA: Insufficient documentation

## 2022-03-15 ENCOUNTER — Encounter: Payer: Self-pay | Admitting: Family Medicine

## 2022-04-09 ENCOUNTER — Ambulatory Visit (INDEPENDENT_AMBULATORY_CARE_PROVIDER_SITE_OTHER): Payer: Medicare Other | Admitting: Orthopaedic Surgery

## 2022-04-09 ENCOUNTER — Encounter: Payer: Self-pay | Admitting: Orthopaedic Surgery

## 2022-04-09 ENCOUNTER — Ambulatory Visit (INDEPENDENT_AMBULATORY_CARE_PROVIDER_SITE_OTHER): Payer: Medicare Other

## 2022-04-09 DIAGNOSIS — M25571 Pain in right ankle and joints of right foot: Secondary | ICD-10-CM

## 2022-04-09 NOTE — Progress Notes (Signed)
The patient comes in today with medial ankle pain and no known injury.  She is 66 years old and has been hurting for about 4 months now.  He really only hurts first thing in the morning.  She does play a lot of pickleball.  She is very active as well.  She does have tenderness along the course of the posterior tibial tendon.  There is no dysfunction of the tendon.  She can go up on her toes easily and perform a straight toe raise with just that foot isolated.  Her arch appears normal and her range of motion of her foot and ankle are normal.  X-rays of the ankle with the right ankle show no acute findings.  She is definitely having some tendinitis of the posterior tibial tendon.  I have recommended Voltaren gel and some stretching exercises to try.  My neck step would her be considering a brace and even outpatient physical therapy.  If is not getting better she will let us know.  All questions concerns were answered and addressed.

## 2022-04-30 ENCOUNTER — Ambulatory Visit (INDEPENDENT_AMBULATORY_CARE_PROVIDER_SITE_OTHER): Payer: Medicare Other | Admitting: Family Medicine

## 2022-04-30 ENCOUNTER — Encounter: Payer: Self-pay | Admitting: Family Medicine

## 2022-04-30 VITALS — BP 124/72 | HR 64 | Temp 97.8°F | Ht 69.0 in | Wt 187.2 lb

## 2022-04-30 DIAGNOSIS — H1013 Acute atopic conjunctivitis, bilateral: Secondary | ICD-10-CM | POA: Diagnosis not present

## 2022-04-30 MED ORDER — PATADAY 0.7 % OP SOLN: 1.0000 [drp] | Freq: Every day | OPHTHALMIC | 6 refills | Status: DC

## 2022-04-30 NOTE — Patient Instructions (Signed)
Follow up as needed or as scheduled Start the eye drops- 1 drop in each eye daily Try and avoid itching and rubbing Call with any questions or concerns Stay Safe!  Stay Healthy! Happy Holidays!!! Safe Travels!!!

## 2022-04-30 NOTE — Progress Notes (Signed)
   Subjective:    Patient ID: [name] [name], female    DOB: 1956/03/16, 66 y.o.   MRN: [id number]  HPI Eye redness- pt reports eyes will intermittently get red, itchy.  It will wax and wane and has been ongoing for the last few months.  No particular triggers- 'it's random'.  Eyes will tear but not get matted shut.     Review of Systems For ROS see HPI     Objective:   Physical Exam Vitals reviewed.  Constitutional:      General: She is not in acute distress.    Appearance: Normal appearance. She is not ill-appearing.  HENT:     Head: Normocephalic and atraumatic.     Right Ear: Tympanic membrane and ear canal normal.     Left Ear: Tympanic membrane and ear canal normal.     Nose: No congestion or rhinorrhea.  Eyes:     General:        Right eye: No discharge.        Left eye: No discharge.     Extraocular Movements: Extraocular movements intact.     Pupils: Pupils are equal, round, and reactive to light.     Comments: Small stye at center of R lower lid Mild conjunctival injection  Skin:    General: Skin is warm and dry.  Neurological:     General: No focal deficit present.     Mental Status: She is alert and oriented to person, place, and time.  Psychiatric:        Mood and Affect: Mood normal.        Behavior: Behavior normal.        Thought Content: Thought content normal.           Assessment & Plan:  Allergic conjunctivitis- new.  Pt has pictures of her eyes from other days.  Sxs are consistent w/ allergic inflammation.  Start Pataday 0.7 1 drop each eye daily.  Reviewed supportive care and red flags that should prompt return.  Pt expressed understanding and is in agreement w/ plan.

## 2022-05-07 DIAGNOSIS — G4733 Obstructive sleep apnea (adult) (pediatric): Secondary | ICD-10-CM | POA: Diagnosis not present

## 2022-05-09 DIAGNOSIS — L821 Other seborrheic keratosis: Secondary | ICD-10-CM | POA: Diagnosis not present

## 2022-05-09 DIAGNOSIS — H0019 Chalazion unspecified eye, unspecified eyelid: Secondary | ICD-10-CM | POA: Diagnosis not present

## 2022-05-09 DIAGNOSIS — L814 Other melanin hyperpigmentation: Secondary | ICD-10-CM | POA: Diagnosis not present

## 2022-05-09 DIAGNOSIS — L570 Actinic keratosis: Secondary | ICD-10-CM | POA: Diagnosis not present

## 2022-05-09 DIAGNOSIS — D2372 Other benign neoplasm of skin of left lower limb, including hip: Secondary | ICD-10-CM | POA: Diagnosis not present

## 2022-05-16 ENCOUNTER — Ambulatory Visit (INDEPENDENT_AMBULATORY_CARE_PROVIDER_SITE_OTHER): Payer: Medicare Other | Admitting: Family Medicine

## 2022-05-16 ENCOUNTER — Encounter: Payer: Self-pay | Admitting: Family Medicine

## 2022-05-16 VITALS — BP 110/80 | HR 66 | Temp 98.4°F | Resp 17 | Ht 69.0 in | Wt 186.4 lb

## 2022-05-16 DIAGNOSIS — E7800 Pure hypercholesterolemia, unspecified: Secondary | ICD-10-CM | POA: Diagnosis not present

## 2022-05-16 DIAGNOSIS — Z0000 Encounter for general adult medical examination without abnormal findings: Secondary | ICD-10-CM | POA: Diagnosis not present

## 2022-05-16 DIAGNOSIS — E559 Vitamin D deficiency, unspecified: Secondary | ICD-10-CM | POA: Diagnosis not present

## 2022-05-16 LAB — BASIC METABOLIC PANEL
BUN: 20 mg/dL (ref 6–23)
CO2: 30 mEq/L (ref 19–32)
Calcium: 9.2 mg/dL (ref 8.4–10.5)
Chloride: 102 mEq/L (ref 96–112)
Creatinine, Ser: 0.81 mg/dL (ref 0.40–1.20)
GFR: 75.37 mL/min (ref >60.00)
Glucose, Bld: 100 mg/dL — ABNORMAL HIGH (ref 70–99)
Potassium: 4.2 mEq/L (ref 3.5–5.1)
Sodium: 139 mEq/L (ref 135–145)

## 2022-05-16 LAB — CBC WITH DIFFERENTIAL/PLATELET
Basophils Absolute: 0 10*3/uL (ref 0.0–0.1)
Basophils Relative: 0.7 % (ref 0.0–3.0)
Eosinophils Absolute: 0.1 10*3/uL (ref 0.0–0.7)
Eosinophils Relative: 2.2 % (ref 0.0–5.0)
HCT: 40.9 % (ref 36.0–46.0)
Hemoglobin: 13.8 g/dL (ref 12.0–15.0)
Lymphocytes Relative: 38 % (ref 12.0–46.0)
Lymphs Abs: 1.8 10*3/uL (ref 0.7–4.0)
MCHC: 33.7 g/dL (ref 30.0–36.0)
MCV: 94.2 fl (ref 78.0–100.0)
Monocytes Absolute: 0.5 10*3/uL (ref 0.1–1.0)
Monocytes Relative: 10.7 % (ref 3.0–12.0)
Neutro Abs: 2.3 10*3/uL (ref 1.4–7.7)
Neutrophils Relative %: 48.4 % (ref 43.0–77.0)
Platelets: 212 10*3/uL (ref 150.0–400.0)
RBC: 4.35 Mil/uL (ref 3.87–5.11)
RDW: 13.6 % (ref 11.5–15.5)
WBC: 4.7 10*3/uL (ref 4.0–10.5)

## 2022-05-16 LAB — VITAMIN D 25 HYDROXY (VIT D DEFICIENCY, FRACTURES): VITD: 21.96 ng/mL — ABNORMAL LOW (ref 30.00–100.00)

## 2022-05-16 LAB — LIPID PANEL
Cholesterol: 250 mg/dL — ABNORMAL HIGH (ref 0–200)
HDL: 69.8 mg/dL (ref >39.00)
LDL Cholesterol: 148 mg/dL — ABNORMAL HIGH (ref 0–99)
NonHDL: 180.56
Total CHOL/HDL Ratio: 4
Triglycerides: 161 mg/dL — ABNORMAL HIGH (ref 0.0–149.0)
VLDL: 32.2 mg/dL (ref 0.0–40.0)

## 2022-05-16 LAB — HEPATIC FUNCTION PANEL
ALT: 20 U/L (ref 0–35)
AST: 19 U/L (ref 0–37)
Albumin: 4.1 g/dL (ref 3.5–5.2)
Alkaline Phosphatase: 52 U/L (ref 39–117)
Bilirubin, Direct: 0.1 mg/dL (ref 0.0–0.3)
Total Bilirubin: 0.6 mg/dL (ref 0.2–1.2)
Total Protein: 6.8 g/dL (ref 6.0–8.3)

## 2022-05-16 LAB — TSH: TSH: 2.33 u[IU]/mL (ref 0.35–5.50)

## 2022-05-16 NOTE — Assessment & Plan Note (Signed)
Pt's PE WNL.  UTD on mammo, colonoscopy, DEXA.  Declines flu and PNA shots today.  Check labs.  Anticipatory guidance provided.

## 2022-05-16 NOTE — Assessment & Plan Note (Signed)
Check labs and replete prn. 

## 2022-05-16 NOTE — Assessment & Plan Note (Signed)
Attempting to control w/ diet and exercise.  Check labs and determine if meds needed.

## 2022-05-16 NOTE — Patient Instructions (Signed)
Follow up in 1 year or as needed We'll notify you of your lab results and make any changes if needed Continue to work on healthy diet and regular exercise- you look great! Call with any questions or concerns Stay Safe! Stay Healthy! Happy Holidays!!!  

## 2022-05-16 NOTE — Progress Notes (Signed)
   Subjective:    Patient ID: [name] [name], female    DOB: 11/20/1955, 66 y.o.   MRN: [id number]  HPI CPE- UTD on colonoscopy, mammo, DEXA.  Declines PNA and flu shots.  Pt reports feeling 'great'.  Patient Care Team    Relationship Specialty Notifications Start End  Midge Minium, MD PCP - General Family Medicine  11/02/15   Skeet Latch, MD PCP - Cardiology Cardiology  01/18/21   Constance Haw, MD PCP - Electrophysiology Cardiology  02/28/22   Nicholas Lose, MD Consulting Physician Hematology and Oncology  11/02/15     Health Maintenance  Topic Date Due   COVID-19 Vaccine (5 - 2023-24 season) 05/16/2022 (Originally 01/26/2022)   Medicare Annual Wellness ([place])  05/28/2022 (Originally 1956-03-12)   INFLUENZA VACCINE  08/27/2022 (Originally 12/26/2021)   Pneumonia Vaccine 52+ Years old (1 - PCV) 05/01/2023 (Originally 06/06/2020)   COLONOSCOPY (Pts 45-64yr Insurance coverage will need to be confirmed)  02/01/2023   MAMMOGRAM  11/22/2023   DTaP/Tdap/Td (3 - Td or Tdap) 11/01/2025   DEXA SCAN  Completed   Hepatitis C Screening  Completed   Zoster Vaccines- Shingrix  Completed   HPV VACCINES  Aged Out      Review of Systems Patient reports no vision/ hearing changes, adenopathy,fever, weight change,  persistant/recurrent hoarseness , swallowing issues, chest pain, palpitations, edema, persistant/recurrent cough, hemoptysis, dyspnea (rest/exertional/paroxysmal nocturnal), gastrointestinal bleeding (melena, rectal bleeding), abdominal pain, significant heartburn, bowel changes, GU symptoms (dysuria, hematuria, incontinence), Gyn symptoms (abnormal  bleeding, pain),  syncope, focal weakness, memory loss, numbness & tingling, skin/hair/nail changes, abnormal bruising or bleeding, anxiety, or depression.     Objective:   Physical Exam General Appearance:    Alert, cooperative, no distress, appears stated age  Head:    Normocephalic, without obvious abnormality, atraumatic  Eyes:     PERRL, conjunctiva/corneas clear, EOM's intact both eyes  Ears:    Normal TM's and external ear canals, both ears  Nose:   Nares normal, septum midline, mucosa normal, no drainage    or sinus tenderness  Throat:   Lips, mucosa, and tongue normal; teeth and gums normal  Neck:   Supple, symmetrical, trachea midline, no adenopathy;    Thyroid: no enlargement/tenderness/nodules  Back:     Symmetric, no curvature, ROM normal, no CVA tenderness  Lungs:     Clear to auscultation bilaterally, respirations unlabored  Chest Wall:    No tenderness or deformity   Heart:    Regular rate and rhythm, S1 and S2 normal, no murmur, rub   or gallop  Breast Exam:    Deferred to mammo  Abdomen:     Soft, non-tender, bowel sounds active all four quadrants,    no masses, no organomegaly  Genitalia:    Deferred  Rectal:    Extremities:   Extremities normal, atraumatic, no cyanosis or edema  Pulses:   2+ and symmetric all extremities  Skin:   Skin color, texture, turgor normal, no rashes or lesions  Lymph nodes:   Cervical, supraclavicular, and axillary nodes normal  Neurologic:   CNII-XII intact, normal strength, sensation and reflexes    throughout          Assessment & Plan:

## 2022-05-17 ENCOUNTER — Other Ambulatory Visit: Payer: Self-pay

## 2022-05-17 ENCOUNTER — Telehealth: Payer: Self-pay

## 2022-05-17 DIAGNOSIS — E559 Vitamin D deficiency, unspecified: Secondary | ICD-10-CM

## 2022-05-17 MED ORDER — VITAMIN D (ERGOCALCIFEROL) 1.25 MG (50000 UNIT) PO CAPS: 50000.0000 [IU] | ORAL_CAPSULE | ORAL | 12 refills | Status: DC

## 2022-05-17 NOTE — Telephone Encounter (Signed)
Patient returned call, I informed her of all labs and that her rx went to the pharmacy.

## 2022-05-17 NOTE — Telephone Encounter (Signed)
-----   Message from Midge Minium, MD sent at 05/17/2022  7:42 AM EST ----- Vit D is low.  Based on this, we need to start 50,000 units weekly x12 weeks in addition to daily OTC supplement of at least 2000 units.   Also, your total cholesterol and LDL (bad cholesterol) have both increased since last check.  Thankfully the ratio of good to bad is still ok.  This will improve w/ healthy diet and regular exercise.  If no improvement at next check, will start medication to lower these numbers.  Remainder of labs look great!

## 2022-05-17 NOTE — Telephone Encounter (Signed)
Left pt a VM to call office in regards to lab results Vit D 50,000 units has been sent to pharmacy

## 2022-06-15 ENCOUNTER — Telehealth (HOSPITAL_COMMUNITY): Payer: Self-pay | Admitting: Family Medicine

## 2022-06-15 NOTE — Telephone Encounter (Signed)
Left message for patient to call back and schedule Medicare Annual Wellness Visit (AWV) in office.   If not able to come in office, please offer to do virtually or by telephone.  Left office number and my jabber [phone].  AWVI eligible as of 05/28/2021  Please schedule at anytime with Nurse Health Advisor.

## 2022-08-02 ENCOUNTER — Encounter: Payer: Self-pay | Admitting: Radiology

## 2022-08-06 ENCOUNTER — Encounter (HOSPITAL_BASED_OUTPATIENT_CLINIC_OR_DEPARTMENT_OTHER): Payer: Self-pay | Admitting: *Deleted

## 2022-08-10 ENCOUNTER — Ambulatory Visit (HOSPITAL_BASED_OUTPATIENT_CLINIC_OR_DEPARTMENT_OTHER): Payer: Medicare Other | Admitting: Family

## 2022-08-13 ENCOUNTER — Ambulatory Visit: Payer: Medicare Other | Admitting: Hematology and Oncology

## 2022-08-16 ENCOUNTER — Telehealth: Payer: Self-pay | Admitting: Family Medicine

## 2022-08-16 NOTE — Telephone Encounter (Signed)
Called patient to schedule Medicare Annual Wellness Visit (AWV). Left message for patient to call back and schedule Medicare Annual Wellness Visit (AWV).  Last date of AWV: AWVI eligible as of  05/28/2021   Please schedule an AWVI appointment at any time with [facility] ANNUAL WELLNESS VISIT.  If any questions, please contact me at [phone].    Thank you,  [place] Direct dial  [phone]

## 2022-08-28 ENCOUNTER — Other Ambulatory Visit (INDEPENDENT_AMBULATORY_CARE_PROVIDER_SITE_OTHER): Payer: Medicare Other

## 2022-08-28 ENCOUNTER — Ambulatory Visit (HOSPITAL_BASED_OUTPATIENT_CLINIC_OR_DEPARTMENT_OTHER): Payer: Medicare Other | Admitting: Family

## 2022-08-28 ENCOUNTER — Encounter (HOSPITAL_BASED_OUTPATIENT_CLINIC_OR_DEPARTMENT_OTHER): Payer: Self-pay | Admitting: Family

## 2022-08-28 VITALS — BP 118/76 | HR 65 | Ht 69.0 in | Wt 189.2 lb

## 2022-08-28 DIAGNOSIS — R002 Palpitations: Secondary | ICD-10-CM

## 2022-08-28 DIAGNOSIS — E559 Vitamin D deficiency, unspecified: Secondary | ICD-10-CM | POA: Diagnosis not present

## 2022-08-28 DIAGNOSIS — I4719 Other supraventricular tachycardia: Secondary | ICD-10-CM | POA: Diagnosis not present

## 2022-08-28 DIAGNOSIS — I471 Supraventricular tachycardia, unspecified: Secondary | ICD-10-CM | POA: Diagnosis not present

## 2022-08-28 MED ORDER — METOPROLOL TARTRATE 25 MG PO TABS: 12.5000 mg | ORAL_TABLET | ORAL | 3 refills | Status: DC | PRN

## 2022-08-28 MED ORDER — METOPROLOL SUCCINATE ER 25 MG PO TB24: 12.5000 mg | ORAL_TABLET | Freq: Two times a day (BID) | ORAL | 1 refills | Status: DC

## 2022-08-28 NOTE — Patient Instructions (Signed)
Medication Instructions:  Your physician has recommended you make the following change in your medication:   Start: Metoprolol Succinate 12.5mg  twice daily   Change: Metoprolol Tartrate to as needed for breakthrough palpitations  *If you need a refill on your cardiac medications before your next appointment, please call your pharmacy*   Lab Work: Your physician recommends that you return for lab work today- Bmp, CBC, Mag and Vit D   If you have labs (blood work) drawn today and your tests are completely normal, you will receive your results only by: [place] (if you have MyChart) OR A paper copy in the mail If you have any lab test that is abnormal or we need to change your treatment, we will call you to review the results.   Testing/Procedures: Your physician has recommended that you wear a Zio monitor.   This monitor is a medical device that records the heart's electrical activity. Doctors most often use these monitors to diagnose arrhythmias. Arrhythmias are problems with the speed or rhythm of the heartbeat. The monitor is a small device applied to your chest. You can wear one while you do your normal daily activities. While wearing this monitor if you have any symptoms to push the button and record what you felt. Once you have worn this monitor for the period of time provider prescribed (Usually 14 days), you will return the monitor device in the postage paid box. Once it is returned they will download the data collected and provide [place] with a report which the provider will then review and we will call you with those results. Important tips:  Avoid showering during the first 24 hours of wearing the monitor. Avoid excessive sweating to help maximize wear time. Do not submerge the device, no hot tubs, and no swimming pools. Keep any lotions or oils away from the patch. After 24 hours you may shower with the patch on. Take brief showers with your back facing the shower head.  Do  not remove patch once it has been placed because that will interrupt data and decrease adhesive wear time. Push the button when you have any symptoms and write down what you were feeling. Once you have completed wearing your monitor, remove and place into box which has postage paid and place in your outgoing mailbox.  If for some reason you have misplaced your box then call our office and we can provide another box and/or mail it off for you.   Follow-Up: At [facility], you and your health needs are our priority.  As part of our continuing mission to provide you with exceptional heart care, we have created designated Provider Care Teams.  These Care Teams include your primary Cardiologist (physician) and Advanced Practice Providers (APPs -  Physician Assistants and Nurse Practitioners) who all work together to provide you with the care you need, when you need it.  We recommend signing up for the patient portal called "MyChart".  Sign up information is provided on this After Visit Summary.  MyChart is used to connect with patients for Virtual Visits (Telemedicine).  Patients are able to view lab/test results, encounter notes, upcoming appointments, etc.  Non-urgent messages can be sent to your provider as well.   To learn more about what you can do with MyChart, go to [email or web address].    Your next appointment:   6 week(s)  Provider:   Dr. Curt Bears, Kandis Fantasia, or Laurann Montana, NP

## 2022-08-28 NOTE — Progress Notes (Unsigned)
Office Visit    Patient Name: [name] [name] Date of Encounter: 08/28/2022  PCP:  Midge Minium, MD   [place]  Cardiologist:  Skeet Latch, MD  Advanced Practice Provider:  No care team member to display Electrophysiologist:  Will Meredith Leeds, MD    Chief Complaint    [name] [name] is a 67 y.o. female with a hx of right breast cancer s/p radiation in September 2016, OSA, paroxysmal SVT s/p RFA ablation (2004) presents today for follow-up after ZIO monitor  Past Medical History    Past Medical History:  Diagnosis Date   Cancer 2016   right breast cancer   Cancer of right breast    last radiation was 01/2015   Dysrhythmia    no problems since ablation 2004   Lipoma of abdominal wall    OSA (obstructive sleep apnea) 04/17/2016   Paroxysmal SVT (supraventricular tachycardia) 2004   Status post RFA ABLATION   S/P radiation therapy 12/28/2014 through 01/27/2015                                                     Right breast 4250 cGy 17 sessions, right breast boost 1200 cGy in 6 sessions                         Past Surgical History:  Procedure Laterality Date   [place] (NO CA)   BREAST LUMPECTOMY WITH RADIOACTIVE SEED AND SENTINEL LYMPH NODE BIOPSY Right 11/02/2014   Procedure: RIGHT BREAST LUMPECTOMY WITH RADIOACTIVE SEED AND RIGHT AXILLARY SENTINEL LYMPH NODE BIOPSY;  Surgeon: Excell Seltzer, MD;  Location: [facility];  Service: General;  Laterality: Right;   COLONOSCOPY     KNEE CARTILAGE SURGERY     LIPOMA EXCISION N/A 03/23/2015   Procedure: [place] WALLL;  Surgeon: Excell Seltzer, MD;  Location: [facility];  Service: General;  Laterality: N/A;   STAPEDECTOMY Right    SVT ablation  2000    Allergies  No Known Allergies  History of Present Illness    [name] [name] is a 67 y.o. female with a hx of right  breast cancer s/p radiation in September 2016, OSA, paroxysmal SVT s/p RFA ablation (2004) last seen 12/08/20 by Dr. Oval Linsey.  She was seen in consult by Dr. Oval Linsey 12/08/2020 to establish care for SVT.  She was feeling overall well until a few months ago when she began noticing intermittent heart flutters with associated lightheadedness.  She described them as a "sloppy "heartbeat and they were consistent throughout the day.  She was were exercising regularly by swimming and following a heart healthy diet.  She wore a 3-day ZIO monitor that showed predominantly normal sinus rhythm with average heart rate of 71 bpm with several runs of SVT up to 4 beats with maximum heart rate SVT and 200 bpm.  Rare PACs and PVCs.  She presents today for follow-up after ZIO monitor.  We reviewed in clinic and also Dr. Daisey Must recommendation for EP evaluation.  Tells me her previous SVT I will ablation was performed in [place].  She moved to the [place] as her husband is from here.  Tells me she does have a young granddaughter who  is 75 years old in [place] who she enjoys visiting. Tells me this feels different than her previous episodes of SVT and not as severe. Notes her heart is still "flipping and flopping" once per day. Feels like a heart beat or a short pause and heart - it does not last very long and self resolves. Notes she has eliminate caffeine with no improvement in symptoms. She does note red wine triggers palpitations and has reduced intake. Marland Kitchen   Notes onset of   Notes family history of tachycardia  Tells me her brother just had an ablation for atrial fibrillation. We discussed difference between atrial fibrillation and SVT.   Notes she is having episodes of tachycardia when she is starting to play pickleball which lasts about 3-4 minutes and is associated lightheadedness.   Takes   Notes not sleeping well.  Often feeling fatigued by the afternoon.  3-4 grandson who is 6 months   The  10-year ASCVD risk score (Arnett DK, et al., 2019) is: 6%    EKGs/Labs/Other Studies Reviewed:   The following studies were reviewed today:  ZIO 11/2020 3 Day Zio Monitor   Quality: Fair.  Baseline artifact. Predominant rhythm: sinus rhythm Average heart rate: 71 bpm Max heart rate: 139 bpm Min heart rate: 47 bpm   7 runs of SVT up to 4 beat.  Maximum heart rate in SVT 200 bpm. Rare PVCs and PACs.    EKG:  EKG ordered today.  EKG performed today demonstrates normal sinus rhythm 65 bpm with T wave inversion V1 V2.  Recent Labs: 05/16/2022: ALT 20; BUN 20; Creatinine, Ser 0.81; Hemoglobin 13.8; Platelets 212.0; Potassium 4.2; Sodium 139; TSH 2.33  Recent Lipid Panel    Component Value Date/Time   CHOL 250 (H) 05/16/2022 0929   TRIG 161.0 (H) 05/16/2022 0929   HDL 69.80 05/16/2022 0929   CHOLHDL 4 05/16/2022 0929   VLDL 32.2 05/16/2022 0929   LDLCALC 148 (H) 05/16/2022 0929   Home Medications   Current Meds  Medication Sig   Cholecalciferol (VITAMIN D3) 2000 units TABS Take 2,000 Units by mouth daily.   metoprolol tartrate (LOPRESSOR) 25 MG tablet Take 12.5 mg by mouth 2 (two) times daily.   vitamin E 1000 UNIT capsule Take 1,000 Units by mouth daily.   [DISCONTINUED] metoprolol succinate (TOPROL XL) 25 MG 24 hr tablet Take 0.5 tablets (12.5 mg total) by mouth daily. (Patient taking differently: Take 12.5 mg by mouth 2 (two) times daily.)     Review of Systems      All other systems reviewed and are otherwise negative except as noted above.  Physical Exam    VS:  BP 118/76 (BP Location: Left Arm, Patient Position: Sitting, Cuff Size: Normal)   Pulse 65   Ht 5\' 9"  (1.753 m)   Wt 189 lb 3.2 oz (85.8 kg)   BMI 27.94 kg/m  , BMI Body mass index is 27.94 kg/m.  Wt Readings from Last 3 Encounters:  08/28/22 189 lb 3.2 oz (85.8 kg)  05/16/22 186 lb 6 oz (84.5 kg)  04/30/22 187 lb 3.2 oz (84.9 kg)    GEN: Well nourished, well developed, in no acute distress. HEENT:  normal. Neck: Supple, no JVD, carotid bruits, or masses. Cardiac: RRR, no murmurs, rubs, or gallops. No clubbing, cyanosis, edema.  Radials/PT 2+ and equal bilaterally.  Respiratory:  Respirations regular and unlabored, clear to auscultation bilaterally. GI: Soft, nontender, nondistended. MS: No deformity or atrophy. Skin: Warm and dry, no rash.  Neuro:  Strength and sensation are intact. Psych: Normal affect.  Assessment & Plan    SVT / Palpitations - Previous RAF ablation 2004 in [place]. Strong family history of SVT. Recent 3-day ZIO with 7 runs of SVT up to 200bpm. Reports daily "heart fluttering" that are overall less severe than previous SVT.  Medication management complicated by low blood pressure.  Will Rx metoprolol tartrate 12.5mg -25mg  PRN for persistent palpitations and refer to EP.  Disposition: Referral placed to EP. Follow up  as scheduled in November  with Dr. Oval Linsey or APP.  Signed, Loel Dubonnet, NP 08/28/2022, 2:33 PM [place]

## 2022-08-29 ENCOUNTER — Telehealth: Payer: Self-pay | Admitting: Family Medicine

## 2022-08-29 DIAGNOSIS — L710 Perioral dermatitis: Secondary | ICD-10-CM | POA: Diagnosis not present

## 2022-08-29 NOTE — Telephone Encounter (Signed)
Called patient to schedule Medicare Annual Wellness Visit (AWV). Left message for patient to call back and schedule Medicare Annual Wellness Visit (AWV).  Last date of AWV: AWVI eligible as of 05/28/2021  Please schedule an AWVI appointment at any time with [place] VISIT.  If any questions, please contact me at [phone].    Thank you,  [place] Direct dial  [phone]

## 2022-08-30 ENCOUNTER — Encounter (HOSPITAL_BASED_OUTPATIENT_CLINIC_OR_DEPARTMENT_OTHER): Payer: Self-pay | Admitting: Family

## 2022-09-03 NOTE — Progress Notes (Signed)
Patient Care Team: Sheliah Hatch, MD as PCP - General (Family Medicine) Chilton Si, MD as PCP - Cardiology (Cardiology) Regan Lemming, MD as PCP - Electrophysiology (Cardiology) Serena Croissant, MD as Consulting Physician (Hematology and Oncology)  DIAGNOSIS: No diagnosis found.  SUMMARY OF ONCOLOGIC HISTORY: Oncology History  Primary cancer of upper outer quadrant of right breast  09/30/2014 Initial Diagnosis   Right breast invasive ductal carcinoma grade 1, Ki-67 5%, ER positive, PR positive, HER-2 negative, 1.4 cm mass at 11:30 position no lymph nodes by ultrasound T1 cN0 M0 stage IA clinical stage   11/02/2014 Surgery   Right lumpectomy: Invasive ductal carcinoma with extracellular mucin, grade 2/3, 1.8 cm, 0/2 lymph nodes negative, ER 98%, PR 63%, HER-2 negative ratio 1.58, Ki-67 7%, Oncotype DX score 12, 8% risk of recurrence, T1 cN0 stage IA   12/28/2014 - 01/27/2015 Radiation Therapy   Adjuvant radiation therapy with boost   03/01/2015 -  Anti-estrogen oral therapy   Anastrozole 1 mg daily     CHIEF COMPLIANT:   INTERVAL HISTORY: [name] [name] is a   ALLERGIES:  has No Known Allergies.  MEDICATIONS:  Current Outpatient Medications  Medication Sig Dispense Refill   Cholecalciferol (VITAMIN D3) 2000 units TABS Take 2,000 Units by mouth daily.     metoprolol succinate (TOPROL XL) 25 MG 24 hr tablet Take 0.5 tablets (12.5 mg total) by mouth in the morning and at bedtime. 90 tablet 1   metoprolol tartrate (LOPRESSOR) 25 MG tablet Take 0.5 tablets (12.5 mg total) by mouth as needed. Take as needed for breakthrough palpitations! 90 tablet 3   vitamin E 1000 UNIT capsule Take 1,000 Units by mouth daily.     No current facility-administered medications for this visit.    PHYSICAL EXAMINATION: ECOG PERFORMANCE STATUS: {CHL ONC ECOG [phone]}  There were no vitals filed for this visit. There were no vitals filed for this visit.  BREAST:*** No palpable  masses or nodules in either right or left breasts. No palpable axillary supraclavicular or infraclavicular adenopathy no breast tenderness or nipple discharge. (exam performed in the presence of a chaperone)  LABORATORY DATA:  I have reviewed the data as listed    Latest Ref Rng & Units 05/16/2022    9:29 AM 05/16/2021    8:12 AM 12/12/2020    7:52 AM  CMP  Glucose 70 - 99 mg/dL 323  90  557   BUN 6 - 23 mg/dL 20  15  18    Creatinine 0.40 - 1.20 mg/dL 3.22  0.25  4.27   Sodium 135 - 145 mEq/L 139  137  141   Potassium 3.5 - 5.1 mEq/L 4.2  4.0  5.0   Chloride 96 - 112 mEq/L 102  102  103   CO2 19 - 32 mEq/L 30  29  25    Calcium 8.4 - 10.5 mg/dL 9.2  9.3  9.7   Total Protein 6.0 - 8.3 g/dL 6.8  6.7  6.8   Total Bilirubin 0.2 - 1.2 mg/dL 0.6  0.7  0.6   Alkaline Phos 39 - 117 U/L 52  59  64   AST 0 - 37 U/L 19  14  13    ALT 0 - 35 U/L 20  12  16      Lab Results  Component Value Date   WBC 4.7 05/16/2022   HGB 13.8 05/16/2022   HCT 40.9 05/16/2022   MCV 94.2 05/16/2022   PLT 212.0 05/16/2022   NEUTROABS  2.3 05/16/2022    ASSESSMENT & PLAN:  No problem-specific Assessment & Plan notes found for this encounter.    No orders of the defined types were placed in this encounter.  The patient has a good understanding of the overall plan. she agrees with it. she will call with any problems that may develop before the next visit here. Total time spent: 30 mins including face to face time and time spent for planning, charting and co-ordination of care   Sherlyn Lick, CMA 09/03/22    I Janan Ridge am acting as a Neurosurgeon for [employer]  ***

## 2022-09-04 ENCOUNTER — Other Ambulatory Visit: Payer: Self-pay

## 2022-09-04 ENCOUNTER — Inpatient Hospital Stay: Payer: Medicare Other | Attending: Hematology and Oncology | Admitting: Hematology and Oncology

## 2022-09-04 VITALS — BP 127/72 | HR 70 | Temp 97.7°F | Resp 18 | Ht 69.0 in | Wt 187.5 lb

## 2022-09-04 DIAGNOSIS — Z853 Personal history of malignant neoplasm of breast: Secondary | ICD-10-CM | POA: Diagnosis not present

## 2022-09-04 DIAGNOSIS — C50411 Malignant neoplasm of upper-outer quadrant of right female breast: Secondary | ICD-10-CM | POA: Diagnosis not present

## 2022-09-04 DIAGNOSIS — Z923 Personal history of irradiation: Secondary | ICD-10-CM | POA: Insufficient documentation

## 2022-09-04 LAB — CBC
Hematocrit: 40 % (ref 34.0–46.6)
Hemoglobin: 13.3 g/dL (ref 11.1–15.9)
MCH: 31.7 pg (ref 26.6–33.0)
MCHC: 33.3 g/dL (ref 31.5–35.7)
MCV: 96 fL (ref 79–97)
Platelets: 214 10*3/uL (ref 150–450)
RBC: 4.19 x10E6/uL (ref 3.77–5.28)
RDW: 13 % (ref 11.7–15.4)
WBC: 7.3 10*3/uL (ref 3.4–10.8)

## 2022-09-04 LAB — VITAMIN D 1,25 DIHYDROXY
Vitamin D 1, 25 (OH)2 Total: 60 pg/mL
Vitamin D2 1, 25 (OH)2: 14 pg/mL
Vitamin D3 1, 25 (OH)2: 46 pg/mL

## 2022-09-04 LAB — BASIC METABOLIC PANEL
BUN/Creatinine Ratio: 28 (ref 12–28)
BUN: 25 mg/dL (ref 8–27)
CO2: 22 mmol/L (ref 20–29)
Calcium: 9.8 mg/dL (ref 8.7–10.3)
Chloride: 105 mmol/L (ref 96–106)
Creatinine, Ser: 0.88 mg/dL (ref 0.57–1.00)
Glucose: 109 mg/dL — ABNORMAL HIGH (ref 70–99)
Potassium: 4.6 mmol/L (ref 3.5–5.2)
Sodium: 141 mmol/L (ref 134–144)
eGFR: 72 mL/min/{1.73_m2} (ref >59)

## 2022-09-04 LAB — MAGNESIUM: Magnesium: 2.2 mg/dL (ref 1.6–2.3)

## 2022-09-04 NOTE — Assessment & Plan Note (Addendum)
Right lumpectomy 11/02/2014: Invasive ductal carcinoma with extracellular mucin, grade 2/3, 1.8 cm, 0/2 lymph nodes negative, ER 98%, PR 63%, HER-2 negative ratio 1.58, Ki-67 7%, Oncotype DX score 12, 8% risk of recurrence, T1 cN0 stage IA status post radiation completed 01/27/2015   Current treatment: Anastrozole adjuvant therapy 1 mg daily 7 years started October 2016 completed March 2023 Anastrozole toxicities:   1. Hot flashes Have resolved since she started taking half a tablet twice a day Since she completed 7 years of therapy we decided to stop it at this time.   Patient frequently travels to [place] to see her granddaughter who is now 45 years old and and new grandson 80 months old.  This year she is planning to travel to [facility] areas.   Breast Cancer Surveillance: 1. Breast exam 09/04/2022 tenderness in the right breast: Benign 2. Mammogram 11/21/2021 at [facility] benign. Postsurgical changes.  Breast density category C    Return to clinic in 1 year for follow-up

## 2022-09-17 ENCOUNTER — Encounter: Payer: Self-pay | Admitting: Hematology and Oncology

## 2022-09-19 ENCOUNTER — Telehealth: Payer: Self-pay | Admitting: Family Medicine

## 2022-09-19 DIAGNOSIS — R002 Palpitations: Secondary | ICD-10-CM | POA: Diagnosis not present

## 2022-09-19 DIAGNOSIS — I471 Supraventricular tachycardia, unspecified: Secondary | ICD-10-CM | POA: Diagnosis not present

## 2022-09-19 NOTE — Telephone Encounter (Signed)
Contacted [name] [name] to schedule their annual wellness visit. Appointment made for 09/27/2022.  Thank you,  [facility] Support [facility] Direct dial  [phone]

## 2022-09-27 ENCOUNTER — Ambulatory Visit: Payer: Medicare Other

## 2022-09-30 NOTE — Progress Notes (Unsigned)
Cardiology Office Note Date:  09/30/2022  Patient ID:  [name] [name], DOB 04-Oct-1955, MRN [id number] PCP:  Sheliah Hatch, MD  Cardiologist:  Dr. Duke Salvia Electrophysiologist: Dr. Johney Frame    Chief Complaint:  *** 6 mo  History of Present Illness: [name] [name] is a 67 y.o. female with history of SVT (ablated 2004), Breast Ca (R side, lumpectomy, XRT, anastrozole),   She saw Dr. Johney Frame, last seen by him Oct 2022, she had been referred for intermittent palpitations, event monitor placed 11/2020 which revealed nonsustained atrial tachycardia up to 4 beats in duration as well as PVCs and PACs.  No sustained arrhythmias were observed. She had not yet tried the metoprolol prescribed for her.  Pt noticed perhaps red wine was a trigger Without sustained arrhythmias, recommended lifestyle management, weight loss, exercise, avoiding triggers. Suspected perhaps AT 2/2 to hx of chest radiation (breast CA) and/or aging. Advised the PRN use of the metoprolol. Could consider flecainide if needed.   I saw her 08/29/21 She is doing OK She is taking the lopressor, ended up with 1/2 tab BID.  It has helped reduce the palpitations though not eliminated them, she can tell when the medicine is running out, due for the 2nd dose. No sustained palpitations, but intermittently can have a day here/there that they are frequent. She swims and play pickle ball for exercise Sometimes the palpitations will happen and make her feel funny she has to stop.No CP SOB, and otherwise feels quite well. No near syncope or syncope. Metoprolol > succ keeping a RPN lopressor Discussed AADs if more Planned to transitionto Dr. Elberta Fortis  She saw Dr. Elberta Fortis 02/28/22, c/w some palpitations though short lived are multiple times a week, most commonly they occur when she is about to take her dose of metoprolol in the morning. Asked about getting coronary Ca++ score for risk eval. Changed her Toprol from 1 pill daily to 1/2 tab  BID with instructions to go to 1 pill BID if needed  Coronary Ca++ was zero   She saw C. Walker, NP 08/28/22, + palpitations associated with lightheadedness when she starts playing pickleball, lasting a few minutes, noting she was taking lopressor BID instead of the succinate. Planned to return to Toprol 12.5mg  BID and update a monitor to evaluate for any potential new arrhythmia.  Not yet formally read: Patient had a min HR of 47 bpm, max HR of 188 bpm, and avg HR of 67 bpm.  Predominant underlying rhythm was Sinus Rhythm.  32 Supraventricular Tachycardia runs occurred, the run with the fastest interval lasting 6 beats with a max rate of 188 bpm, the  longest lasting 17 beats with an avg rate of 119 bpm.  Some episodes of Supraventricular Tachycardia may be possible Atrial Tachycardia with variable block.  Ectopic Atrial Rhythm was present.  Supraventricular Tachycardia and Ectopic Atrial Rhythm were detected within +/- 45 seconds of symptomatic patient event(s).  Isolated SVEs were rare (<1.0%), SVE Couplets were rare (<1.0%), and SVE Triplets were rare (<1.0%). Isolated VEs were rare (<1.0%), VE Couplets were rare (<1.0%), and no VE Triplets were  present. Ventricular Trigeminy was present.  *** symptoms *** labs, lipids... *** meds, flecainide?  Past Medical History:  Diagnosis Date   Cancer [facility]) 2016   right breast cancer   Cancer of right breast (HCC)    last radiation was 01/2015   Dysrhythmia    no problems since ablation 2004   Lipoma of abdominal wall    OSA (obstructive sleep  apnea) 04/17/2016   Paroxysmal SVT (supraventricular tachycardia) 2004   Status post RFA ABLATION   S/P radiation therapy 12/28/2014 through 01/27/2015                                                     Right breast 4250 cGy 17 sessions, right breast boost 1200 cGy in 6 sessions                          Past Surgical History:  Procedure Laterality Date   ABDOMINAL HYSTERECTOMY  1999   WITH  BLADDER REPAIR FOR PROPLAPSE-HAS OVARIES (NO CA)   BREAST LUMPECTOMY WITH RADIOACTIVE SEED AND SENTINEL LYMPH NODE BIOPSY Right 11/02/2014   Procedure: RIGHT BREAST LUMPECTOMY WITH RADIOACTIVE SEED AND RIGHT AXILLARY SENTINEL LYMPH NODE BIOPSY;  Surgeon: Glenna Fellows, MD;  Location: [facility];  Service: General;  Laterality: Right;   COLONOSCOPY     KNEE CARTILAGE SURGERY     LIPOMA EXCISION N/A 03/23/2015   Procedure: EXCISION LIPOMA ABDOMINAL WALLL;  Surgeon: Glenna Fellows, MD;  Location: [facility];  Service: General;  Laterality: N/A;   STAPEDECTOMY Right    SVT ablation  2000    Current Outpatient Medications  Medication Sig Dispense Refill   Cholecalciferol (VITAMIN D3) 2000 units TABS Take 2,000 Units by mouth daily.     metoprolol succinate (TOPROL XL) 25 MG 24 hr tablet Take 0.5 tablets (12.5 mg total) by mouth in the morning and at bedtime. 90 tablet 1   metoprolol tartrate (LOPRESSOR) 25 MG tablet Take 0.5 tablets (12.5 mg total) by mouth as needed. Take as needed for breakthrough palpitations! 90 tablet 3   metroNIDAZOLE (METROCREAM) 0.75 % cream Apply topically 2 (two) times daily.     vitamin E 1000 UNIT capsule Take 1,000 Units by mouth daily.     No current facility-administered medications for this visit.    Allergies:   Patient has no known allergies.   Social History:  The patient  reports that she quit smoking about 44 years ago. Her smoking use included cigarettes. She has a 12.00 pack-year smoking history. She has never used smokeless tobacco. She reports current alcohol use of about 4.0 - 5.0 standard drinks of alcohol per week. She reports that she does not use drugs.   Family History:  The patient's family history includes Acute lymphoblastic leukemia in her paternal grandfather; Bladder Cancer in her father; CVA in her maternal grandmother and mother; Heart attack in her maternal grandfather; Leukemia in her paternal  grandfather; Lung cancer in her paternal aunt, paternal uncle, and paternal uncle; Ovarian cancer in her paternal aunt; Supraventricular tachycardia in her brother, grandchild, sister, and son.  ROS:  Please see the history of present illness.    All other systems are reviewed and otherwise negative.   PHYSICAL EXAM:  VS:  There were no vitals taken for this visit. BMI: There is no height or weight on file to calculate BMI. Well nourished, well developed, in no acute distress HEENT: normocephalic, atraumatic Neck: no JVD, carotid bruits or masses Cardiac: ***  RRR; no significant murmurs, no rubs, or gallops Lungs: *** CTA b/l, no wheezing, rhonchi or rales Abd: soft, nontender MS: no deformity or atrophy Ext: *** no edema Skin: warm and dry, no rash  Neuro:  No gross deficits appreciated Psych: euthymic mood, full affect   EKG:  not done today    03/07/22: Coronary Ca++ score IMPRESSION: Coronary calcium score of 0. This was 0 percentile for age-, race-, and sex-matched controls.   Mild aortic atherosclerosis.  July 2022: 3 Day Zio Monitor Quality: Fair.  Baseline artifact. Predominant rhythm: sinus rhythm Average heart rate: 71 bpm Max heart rate: 139 bpm Min heart rate: 47 bpm   7 runs of SVT up to 4 beat.  Maximum heart rate in SVT 200 bpm. Rare PVCs and PACs.     Recent Labs: 05/16/2022: ALT 20; TSH 2.33 08/28/2022: BUN 25; Creatinine, Ser 0.88; Hemoglobin 13.3; Magnesium 2.2; Platelets 214; Potassium 4.6; Sodium 141  05/16/2022: Cholesterol 250; HDL 69.80; LDL Cholesterol 148; Total CHOL/HDL Ratio 4; Triglycerides 161.0; VLDL 32.2   CrCl cannot be calculated (Patient's most recent lab result is older than the maximum 21 days allowed.).   Wt Readings from Last 3 Encounters:  09/04/22 187 lb 8 oz (85 kg)  08/28/22 189 lb 3.2 oz (85.8 kg)  05/16/22 186 lb 6 oz (84.5 kg)     Other studies reviewed: Additional studies/records reviewed today include: summarized  above  ASSESSMENT AND PLAN:  SVT, likely an AT ***    Disposition: ***    Current medicines are reviewed at length with the patient today.  The patient did not have any concerns regarding medicines.  Norma Fredrickson, PA-C 09/30/2022 1:44 PM     [facility] [address] [place] [address] [phone] (office)  [phone] (fax)

## 2022-10-02 ENCOUNTER — Ambulatory Visit: Payer: Medicare Other | Attending: Physician Assistant | Admitting: Physician Assistant

## 2022-10-02 ENCOUNTER — Encounter: Payer: Self-pay | Admitting: Physician Assistant

## 2022-10-02 VITALS — BP 116/74 | HR 71 | Ht 68.0 in | Wt 186.8 lb

## 2022-10-02 DIAGNOSIS — I4719 Other supraventricular tachycardia: Secondary | ICD-10-CM | POA: Diagnosis not present

## 2022-10-02 DIAGNOSIS — E559 Vitamin D deficiency, unspecified: Secondary | ICD-10-CM | POA: Diagnosis not present

## 2022-10-02 DIAGNOSIS — R011 Cardiac murmur, unspecified: Secondary | ICD-10-CM | POA: Diagnosis not present

## 2022-10-02 DIAGNOSIS — R002 Palpitations: Secondary | ICD-10-CM

## 2022-10-02 DIAGNOSIS — I471 Supraventricular tachycardia, unspecified: Secondary | ICD-10-CM | POA: Diagnosis not present

## 2022-10-02 MED ORDER — METOPROLOL SUCCINATE ER 25 MG PO TB24: 12.5000 mg | ORAL_TABLET | Freq: Every day | ORAL | 2 refills | Status: DC

## 2022-10-02 NOTE — Patient Instructions (Addendum)
Medication Instructions:   START TAKING : TOPROL 12.5 MG  IN  THE AM AND TOPROL  25 MG IN THE PM    *If you need a refill on your cardiac medications before your next appointment, please call your pharmacy*   Lab Work: NONE ORDERED  TODAY    If you have labs (blood work) drawn today and your tests are completely normal, you will receive your results only by: MyChart Message (if you have MyChart) OR A paper copy in the mail If you have any lab test that is abnormal or we need to change your treatment, we will call you to review the results.   Testing/Procedures: Your physician has requested that you have an echocardiogram. Echocardiography is a painless test that uses sound waves to create images of your heart. It provides your doctor with information about the size and shape of your heart and how well your heart's chambers and valves are working. This procedure takes approximately one hour. There are no restrictions for this procedure. Please do NOT wear cologne, perfume, aftershave, or lotions (deodorant is allowed). Please arrive 15 minutes prior to your appointment time.   Follow-Up: At [facility], you and your health needs are our priority.  As part of our continuing mission to provide you with exceptional heart care, we have created designated Provider Care Teams.  These Care Teams include your primary Cardiologist (physician) and Advanced Practice Providers (APPs -  Physician Assistants and Nurse Practitioners) who all work together to provide you with the care you need, when you need it.  We recommend signing up for the patient portal called "MyChart".  Sign up information is provided on this After Visit Summary.  MyChart is used to connect with patients for Virtual Visits (Telemedicine).  Patients are able to view lab/test results, encounter notes, upcoming appointments, etc.  Non-urgent messages can be sent to your provider as well.   To learn more about what you can do  with MyChart, go to [email or web address].    Your next appointment:    6 month(s)  Provider:   You may see Will Jorja Loa, MD or one of the following Advanced Practice Providers on your designated Care Team:   Francis Dowse, [place] Casimiro Needle "Mardelle Matte" Lanna Poche, [place]  Other Instructions

## 2022-10-09 ENCOUNTER — Telehealth: Payer: Self-pay | Admitting: Family Medicine

## 2022-10-09 NOTE — Telephone Encounter (Signed)
Called patient to schedule Medicare Annual Wellness Visit (AWV). Left message for patient to call back and schedule Medicare Annual Wellness Visit (AWV).  Last date of AWV: 05/28/2021   Please schedule an AWVS appointment at any time with [facility] SV ANNUAL WELLNESS VISIT.  If any questions, please contact me at [phone].    Thank you,  [facility] Support [facility] Direct dial  [phone]

## 2022-10-29 ENCOUNTER — Ambulatory Visit: Payer: Medicare Other | Admitting: Family Medicine

## 2022-10-29 DIAGNOSIS — R002 Palpitations: Secondary | ICD-10-CM | POA: Diagnosis not present

## 2022-10-29 DIAGNOSIS — I471 Supraventricular tachycardia, unspecified: Secondary | ICD-10-CM | POA: Diagnosis not present

## 2022-10-29 DIAGNOSIS — I4719 Other supraventricular tachycardia: Secondary | ICD-10-CM | POA: Diagnosis not present

## 2022-10-29 DIAGNOSIS — E559 Vitamin D deficiency, unspecified: Secondary | ICD-10-CM | POA: Diagnosis not present

## 2022-10-30 ENCOUNTER — Encounter: Payer: Self-pay | Admitting: Family Medicine

## 2022-10-30 ENCOUNTER — Ambulatory Visit (INDEPENDENT_AMBULATORY_CARE_PROVIDER_SITE_OTHER): Payer: Medicare Other | Admitting: Family Medicine

## 2022-10-30 VITALS — BP 106/60 | HR 58 | Temp 98.1°F | Resp 17 | Ht 68.0 in | Wt 182.1 lb

## 2022-10-30 DIAGNOSIS — Z6827 Body mass index (BMI) 27.0-27.9, adult: Secondary | ICD-10-CM | POA: Diagnosis not present

## 2022-10-30 DIAGNOSIS — E663 Overweight: Secondary | ICD-10-CM | POA: Diagnosis not present

## 2022-10-30 DIAGNOSIS — E7800 Pure hypercholesterolemia, unspecified: Secondary | ICD-10-CM | POA: Diagnosis not present

## 2022-10-30 LAB — LIPID PANEL
Cholesterol: 236 mg/dL — ABNORMAL HIGH (ref 0–200)
HDL: 62.4 mg/dL (ref >39.00)
LDL Cholesterol: 158 mg/dL — ABNORMAL HIGH (ref 0–99)
NonHDL: 173.2
Total CHOL/HDL Ratio: 4
Triglycerides: 75 mg/dL (ref 0.0–149.0)
VLDL: 15 mg/dL (ref 0.0–40.0)

## 2022-10-30 LAB — HEPATIC FUNCTION PANEL
ALT: 15 U/L (ref 0–35)
AST: 18 U/L (ref 0–37)
Albumin: 4.3 g/dL (ref 3.5–5.2)
Alkaline Phosphatase: 43 U/L (ref 39–117)
Bilirubin, Direct: 0.1 mg/dL (ref 0.0–0.3)
Total Bilirubin: 0.7 mg/dL (ref 0.2–1.2)
Total Protein: 7.2 g/dL (ref 6.0–8.3)

## 2022-10-30 LAB — BASIC METABOLIC PANEL
BUN: 17 mg/dL (ref 6–23)
CO2: 30 mEq/L (ref 19–32)
Calcium: 9.6 mg/dL (ref 8.4–10.5)
Chloride: 103 mEq/L (ref 96–112)
Creatinine, Ser: 0.77 mg/dL (ref 0.40–1.20)
GFR: 79.83 mL/min (ref >60.00)
Glucose, Bld: 100 mg/dL — ABNORMAL HIGH (ref 70–99)
Potassium: 4.5 mEq/L (ref 3.5–5.1)
Sodium: 140 mEq/L (ref 135–145)

## 2022-10-30 NOTE — Assessment & Plan Note (Signed)
Pt is down 5 lbs since last visit.  Exercising regularly, stopped drinking alcohol.  Applauded her efforts.  Will follow.

## 2022-10-30 NOTE — Progress Notes (Signed)
   Subjective:    Patient ID: [name] [name], female    DOB: 1955/08/08, 67 y.o.   MRN: [id number]  HPI Hyperlipidemia- ongoing issue for pt.  Last LDL 148.  Is exercising regularly, stopped drinking alcohol.  No CP, SOB, abd pain, N/V.  Overweight- pt has lost 5 lbs in the last month.  Pt reports 'feeling fabulous'.   Review of Systems For ROS see HPI     Objective:   Physical Exam Vitals reviewed.  Constitutional:      General: She is not in acute distress.    Appearance: Normal appearance. She is well-developed. She is not ill-appearing.  HENT:     Head: Normocephalic and atraumatic.  Eyes:     Conjunctiva/sclera: Conjunctivae normal.     Pupils: Pupils are equal, round, and reactive to light.  Neck:     Thyroid: No thyromegaly.  Cardiovascular:     Rate and Rhythm: Normal rate and regular rhythm.     Pulses: Normal pulses.     Heart sounds: Normal heart sounds. No murmur heard. Pulmonary:     Effort: Pulmonary effort is normal. No respiratory distress.     Breath sounds: Normal breath sounds.  Abdominal:     General: There is no distension.     Palpations: Abdomen is soft.     Tenderness: There is no abdominal tenderness.  Musculoskeletal:     Cervical back: Normal range of motion and neck supple.     Right lower leg: No edema.     Left lower leg: No edema.  Lymphadenopathy:     Cervical: No cervical adenopathy.  Skin:    General: Skin is warm and dry.  Neurological:     General: No focal deficit present.     Mental Status: She is alert and oriented to person, place, and time.  Psychiatric:        Mood and Affect: Mood normal.        Behavior: Behavior normal.           Assessment & Plan:

## 2022-10-30 NOTE — Patient Instructions (Signed)
Schedule your complete physical in 6 months We'll notify you of your lab results and make any changes if needed Keep up the good work on healthy diet and regular exercise- you're doing great!!! Call with any questions or concerns Stay Safe!  Stay Healthy! Have a great summer!!!

## 2022-10-30 NOTE — Assessment & Plan Note (Signed)
Ongoing issue for pt.  Attempting to manage w/ healthy diet and regular exercise.  Check labs and determine if medication is needed

## 2022-10-31 ENCOUNTER — Telehealth: Payer: Self-pay

## 2022-10-31 ENCOUNTER — Other Ambulatory Visit: Payer: Self-pay

## 2022-10-31 DIAGNOSIS — E785 Hyperlipidemia, unspecified: Secondary | ICD-10-CM

## 2022-10-31 MED ORDER — ROSUVASTATIN CALCIUM 10 MG PO TABS: 10.0000 mg | ORAL_TABLET | Freq: Every day | ORAL | 3 refills | Status: DC

## 2022-10-31 NOTE — Telephone Encounter (Signed)
-----   Message from Sheliah Hatch, MD sent at 10/31/2022  7:23 AM EDT ----- Total cholesterol and LDL (bad cholesterol) both remain above goal.  Thankfully HDL (good cholesterol) is excellent.  But with the LDL (bad) creeping up towards 160, I do think it would be beneficial to start a cholesterol medication.  We will send Crestor 10mg  nightly (#30, 3 refills) and have you return in 6-8 weeks for a lab only visit to recheck liver functions to make sure the medication is being metabolized appropriately (dx hyperlipidemia)  Remainder of labs look great!

## 2022-10-31 NOTE — Telephone Encounter (Signed)
I left pt a vm to call office in regards to lab results . I have placed the lab liver function test and pt needs a 6 week lab only visit .  Crestor 10 mg sent to pharmacy

## 2022-10-31 NOTE — Progress Notes (Signed)
Error

## 2022-11-23 ENCOUNTER — Ambulatory Visit (INDEPENDENT_AMBULATORY_CARE_PROVIDER_SITE_OTHER): Payer: Medicare Other

## 2022-11-23 DIAGNOSIS — Z1231 Encounter for screening mammogram for malignant neoplasm of breast: Secondary | ICD-10-CM | POA: Diagnosis not present

## 2022-11-23 DIAGNOSIS — R011 Cardiac murmur, unspecified: Secondary | ICD-10-CM | POA: Diagnosis not present

## 2022-11-23 LAB — ECHOCARDIOGRAM COMPLETE
AR max vel: 2.8 cm2
AV Area VTI: 2.57 cm2
AV Area mean vel: 2.72 cm2
AV Mean grad: 3 mmHg
AV Peak grad: 5.9 mmHg
Ao pk vel: 1.21 m/s
Area-P 1/2: 3.68 cm2
S' Lateral: 2.43 cm

## 2022-11-27 DIAGNOSIS — G4733 Obstructive sleep apnea (adult) (pediatric): Secondary | ICD-10-CM | POA: Diagnosis not present

## 2022-11-30 ENCOUNTER — Encounter: Payer: Self-pay | Admitting: Hematology and Oncology

## 2022-12-01 ENCOUNTER — Encounter: Payer: Self-pay | Admitting: Family Medicine

## 2022-12-06 ENCOUNTER — Encounter: Payer: Self-pay | Admitting: Gastroenterology

## 2022-12-20 ENCOUNTER — Encounter: Payer: Self-pay | Admitting: Gastroenterology

## 2023-01-07 ENCOUNTER — Encounter: Payer: Self-pay | Admitting: Family Medicine

## 2023-01-09 ENCOUNTER — Other Ambulatory Visit: Payer: Self-pay

## 2023-01-09 DIAGNOSIS — E785 Hyperlipidemia, unspecified: Secondary | ICD-10-CM

## 2023-01-09 MED ORDER — ROSUVASTATIN CALCIUM 10 MG PO TABS: 10.0000 mg | ORAL_TABLET | Freq: Every day | ORAL | 1 refills | Status: DC

## 2023-01-11 ENCOUNTER — Other Ambulatory Visit (INDEPENDENT_AMBULATORY_CARE_PROVIDER_SITE_OTHER): Payer: Medicare Other

## 2023-01-11 DIAGNOSIS — E785 Hyperlipidemia, unspecified: Secondary | ICD-10-CM | POA: Diagnosis not present

## 2023-01-11 LAB — HEPATIC FUNCTION PANEL
ALT: 16 U/L (ref 0–35)
AST: 17 U/L (ref 0–37)
Albumin: 4.4 g/dL (ref 3.5–5.2)
Alkaline Phosphatase: 53 U/L (ref 39–117)
Bilirubin, Direct: 0.1 mg/dL (ref 0.0–0.3)
Total Bilirubin: 0.6 mg/dL (ref 0.2–1.2)
Total Protein: 6.9 g/dL (ref 6.0–8.3)

## 2023-01-14 ENCOUNTER — Telehealth: Payer: Self-pay

## 2023-01-14 NOTE — Telephone Encounter (Signed)
-----   Message from Neena Rhymes sent at 01/14/2023  8:24 AM EDT ----- Labs look great!  No changes at this time

## 2023-01-14 NOTE — Telephone Encounter (Signed)
Left results on pt VM  

## 2023-01-23 ENCOUNTER — Encounter: Payer: Self-pay | Admitting: Gastroenterology

## 2023-01-23 ENCOUNTER — Ambulatory Visit (AMBULATORY_SURGERY_CENTER): Payer: Medicare Other

## 2023-01-23 VITALS — Ht 68.0 in | Wt 175.0 lb

## 2023-01-23 DIAGNOSIS — Z86010 Personal history of colonic polyps: Secondary | ICD-10-CM

## 2023-01-23 MED ORDER — NA SULFATE-K SULFATE-MG SULF 17.5-3.13-1.6 GM/177ML PO SOLN: 1.0000 | Freq: Once | ORAL | 0 refills | Status: AC

## 2023-01-23 NOTE — Progress Notes (Signed)
Pre visit completed via phone call; Patient verified name, DOB, and address;  No egg or soy allergy known to patient;  No issues known to pt with past sedation with any surgeries or procedures; Patient denies ever being told they had issues or difficulty with intubation;  No FH of Malignant Hyperthermia; Pt is not on diet pills; Pt is not on home 02;  Pt is not on blood thinners;  Pt denies issues with constipation;  No A fib or A flutter;  Have any cardiac testing pending--NO Insurance verified during PV appt--- [facility] Medicare  Pt can ambulate without assistance;  Pt denies use of chewing tobacco Discussed diabetic/weight loss medication holds; Discussed NSAID holds; Checked BMI to be less than 50; Pt instructed to use Singlecare.com or GoodRx for a price reduction on prep;  Patient's chart reviewed by Cathlyn Parsons CNRA prior to previsit and patient appropriate for the [facility]; Pre visit completed and red dot placed by patient's name on their procedure day (on provider's schedule).    Instructions sent to MyChart per patient's request;

## 2023-02-08 DIAGNOSIS — H90A32 Mixed conductive and sensorineural hearing loss, unilateral, left ear with restricted hearing on the contralateral side: Secondary | ICD-10-CM | POA: Diagnosis not present

## 2023-02-08 DIAGNOSIS — H906 Mixed conductive and sensorineural hearing loss, bilateral: Secondary | ICD-10-CM | POA: Diagnosis not present

## 2023-02-08 DIAGNOSIS — H8003 Otosclerosis involving oval window, nonobliterative, bilateral: Secondary | ICD-10-CM | POA: Diagnosis not present

## 2023-02-09 ENCOUNTER — Encounter: Payer: Self-pay | Admitting: Certified Registered Nurse Anesthetist

## 2023-02-13 ENCOUNTER — Ambulatory Visit (AMBULATORY_SURGERY_CENTER): Payer: Medicare Other | Admitting: Gastroenterology

## 2023-02-13 ENCOUNTER — Encounter: Payer: Self-pay | Admitting: Gastroenterology

## 2023-02-13 VITALS — BP 144/82 | HR 56 | Temp 98.4°F | Resp 10 | Ht 68.0 in | Wt 175.0 lb

## 2023-02-13 DIAGNOSIS — Z86010 Personal history of colonic polyps: Secondary | ICD-10-CM | POA: Diagnosis not present

## 2023-02-13 DIAGNOSIS — Z09 Encounter for follow-up examination after completed treatment for conditions other than malignant neoplasm: Secondary | ICD-10-CM

## 2023-02-13 MED ORDER — SODIUM CHLORIDE 0.9 % IV SOLN: 500.0000 mL | Freq: Once | INTRAVENOUS | Status: DC

## 2023-02-13 NOTE — Progress Notes (Signed)
Vitals-CW  Pt's states no medical or surgical changes since previsit or office visit. 

## 2023-02-13 NOTE — Patient Instructions (Signed)

## 2023-02-13 NOTE — Progress Notes (Signed)
History and Physical:  This patient presents for endoscopic testing for: Encounter Diagnosis  Name Primary?   Personal history of colonic polyps Yes    Diminutive cecal TA Sept 2017 - surveillance exam today Patient denies chronic abdominal pain, rectal bleeding, constipation or diarrhea.   Patient is otherwise without complaints or active issues today.   Past Medical History: Past Medical History:  Diagnosis Date   Cancer of right breast (HCC) 2016   last radiation was 01/2015   Dysrhythmia    Hyperlipidemia    on meds   Hypertension    on meds   Lipoma of abdominal wall    OSA (obstructive sleep apnea) 04/17/2016   CPAP used nightly   Paroxysmal SVT (supraventricular tachycardia) 2004   Status post RFA ABLATION   S/P radiation therapy 12/28/2014 through 01/27/2015                                                     Right breast 4250 cGy 17 sessions, right breast boost 1200 cGy in 6 sessions                           Past Surgical History: Past Surgical History:  Procedure Laterality Date   ABDOMINAL HYSTERECTOMY  1999   WITH BLADDER REPAIR FOR PROPLAPSE-HAS OVARIES (NO CA)   BREAST LUMPECTOMY WITH RADIOACTIVE SEED AND SENTINEL LYMPH NODE BIOPSY Right 11/02/2014   Procedure: RIGHT BREAST LUMPECTOMY WITH RADIOACTIVE SEED AND RIGHT AXILLARY SENTINEL LYMPH NODE BIOPSY;  Surgeon: Glenna Fellows, MD;  Location: [facility];  Service: General;  Laterality: Right;   COLONOSCOPY  2017   HD-MAC-suprep(good)-hems/TA   KNEE CARTILAGE SURGERY     LIPOMA EXCISION N/A 03/23/2015   Procedure: EXCISION LIPOMA ABDOMINAL WALLL;  Surgeon: Glenna Fellows, MD;  Location: [facility];  Service: General;  Laterality: N/A;   POLYPECTOMY  2017   TA   STAPEDECTOMY Right    SVT ablation  2000    Allergies: No Known Allergies  Outpatient Meds: Current Outpatient Medications  Medication Sig Dispense Refill   Cholecalciferol (VITAMIN D3) 2000 units TABS  Take 2,000 Units by mouth daily.     metoprolol succinate (TOPROL XL) 25 MG 24 hr tablet Take 0.5 tablets (12.5 mg total) by mouth daily. AND Take a whole tablet  25 mg in the pm 135 tablet 2   metoprolol tartrate (LOPRESSOR) 25 MG tablet Take 0.5 tablets (12.5 mg total) by mouth as needed. Take as needed for breakthrough palpitations! 90 tablet 3   rosuvastatin (CRESTOR) 10 MG tablet Take 1 tablet (10 mg total) by mouth daily. 90 tablet 1   vitamin E 1000 UNIT capsule Take 1,000 Units by mouth daily.     Current Facility-Administered Medications  Medication Dose Route Frequency Provider Last Rate Last Admin   0.9 %  sodium chloride infusion  500 mL Intravenous Once Danis, Andreas Blower, MD          ___________________________________________________________________ Objective   Exam:  BP (!) 149/82 (BP Location: Right Arm, Patient Position: Sitting, Cuff Size: Normal)   Pulse 64   Temp 98.4 F (36.9 C) (Temporal)   Ht 5\' 8"  (1.727 m)   Wt 175 lb (79.4 kg)   SpO2 98%   BMI 26.61 kg/m   CV:  regular , S1/S2 Resp: clear to auscultation bilaterally, normal RR and effort noted GI: soft, no tenderness, with active bowel sounds.   Assessment: Encounter Diagnosis  Name Primary?   Personal history of colonic polyps Yes     Plan: Colonoscopy   The benefits and risks of the planned procedure were described in detail with the patient or (when appropriate) their health care proxy.  Risks were outlined as including, but not limited to, bleeding, infection, perforation, adverse medication reaction leading to cardiac or pulmonary decompensation, pancreatitis (if ERCP).  The limitation of incomplete mucosal visualization was also discussed.  No guarantees or warranties were given.  The patient is appropriate for an endoscopic procedure in the ambulatory setting.   - [name] Jupiter, MD

## 2023-02-13 NOTE — Progress Notes (Signed)
Report given to PACU, vss 

## 2023-02-13 NOTE — Op Note (Signed)
[facility] Patient Name: [name] [name] Procedure Date: 02/13/2023 11:17 AM MRN: [id number] Endoscopist: Sherilyn Cooter L. Myrtie Neither , MD, [id number] Age: 67 Referring MD:  Date of Birth: 08-21-1955 Gender: Female Account #: [id number] Procedure:                Colonoscopy Indications:              Surveillance: Personal history of adenomatous                            polyps on last colonoscopy > 5 years ago                           Diminutive cecal tubular adenoma September 2017 Medicines:                Monitored Anesthesia Care Procedure:                Pre-Anesthesia Assessment:                           - Prior to the procedure, a History and Physical                            was performed, and patient medications and                            allergies were reviewed. The patient's tolerance of                            previous anesthesia was also reviewed. The risks                            and benefits of the procedure and the sedation                            options and risks were discussed with the patient.                            All questions were answered, and informed consent                            was obtained. Prior Anticoagulants: The patient has                            taken no anticoagulant or antiplatelet agents. ASA                            Grade Assessment: II - A patient with mild systemic                            disease. After reviewing the risks and benefits,                            the patient was deemed in satisfactory condition to  undergo the procedure.                           After obtaining informed consent, the colonoscope                            was passed under direct vision. Throughout the                            procedure, the patient's blood pressure, pulse, and                            oxygen saturations were monitored continuously. The                            Olympus CF-HQ190L  [id number]) Colonoscope was                            introduced through the anus and advanced to the the                            cecum, identified by appendiceal orifice and                            ileocecal valve. The colonoscopy was performed                            without difficulty. The patient tolerated the                            procedure well. The quality of the bowel                            preparation was excellent. The ileocecal valve,                            appendiceal orifice, and rectum were photographed. Scope In: 11:28:06 AM Scope Out: 11:38:48 AM Scope Withdrawal Time: 0 hours 6 minutes 37 seconds  Total Procedure Duration: 0 hours 10 minutes 42 seconds  Findings:                 The perianal and digital rectal examinations were                            normal.                           Repeat examination of right colon under NBI                            performed.                           Internal hemorrhoids were found.  The exam was otherwise without abnormality on                            direct and retroflexion views. Complications:            No immediate complications. Estimated Blood Loss:     Estimated blood loss: none. Impression:               - Internal hemorrhoids.                           - The examination was otherwise normal on direct                            and retroflexion views.                           - No specimens collected. Recommendation:           - Patient has a contact number available for                            emergencies. The signs and symptoms of potential                            delayed complications were discussed with the                            patient. Return to normal activities tomorrow.                            Written discharge instructions were provided to the                            patient.                           - Resume previous diet.                            - Continue present medications.                           - Repeat colonoscopy in 10 years for screening                            purposes. Icel Castles L. Myrtie Neither, MD 02/13/2023 11:42:37 AM This report has been signed electronically.

## 2023-02-14 ENCOUNTER — Telehealth: Payer: Self-pay

## 2023-02-14 NOTE — Telephone Encounter (Signed)
  Follow up Call-     02/13/2023   11:14 AM 02/13/2023   11:04 AM  Call back number  Post procedure Call Back phone  # [phone]   Permission to leave phone message  Yes   Follow up call, LVM

## 2023-02-28 DIAGNOSIS — H02836 Dermatochalasis of left eye, unspecified eyelid: Secondary | ICD-10-CM | POA: Diagnosis not present

## 2023-02-28 DIAGNOSIS — H2513 Age-related nuclear cataract, bilateral: Secondary | ICD-10-CM | POA: Diagnosis not present

## 2023-02-28 DIAGNOSIS — H02833 Dermatochalasis of right eye, unspecified eyelid: Secondary | ICD-10-CM | POA: Diagnosis not present

## 2023-02-28 DIAGNOSIS — H353131 Nonexudative age-related macular degeneration, bilateral, early dry stage: Secondary | ICD-10-CM | POA: Diagnosis not present

## 2023-02-28 DIAGNOSIS — H04123 Dry eye syndrome of bilateral lacrimal glands: Secondary | ICD-10-CM | POA: Diagnosis not present

## 2023-04-05 ENCOUNTER — Ambulatory Visit: Payer: Medicare Other | Admitting: Cardiology

## 2023-04-08 ENCOUNTER — Other Ambulatory Visit: Payer: Self-pay | Admitting: Family Medicine

## 2023-04-08 DIAGNOSIS — E785 Hyperlipidemia, unspecified: Secondary | ICD-10-CM

## 2023-05-07 ENCOUNTER — Ambulatory Visit: Payer: Medicare Other | Admitting: Family Medicine

## 2023-05-07 ENCOUNTER — Encounter: Payer: Self-pay | Admitting: Family Medicine

## 2023-05-07 VITALS — BP 112/78 | HR 61 | Temp 98.3°F | Ht 68.0 in | Wt 183.0 lb

## 2023-05-07 DIAGNOSIS — R21 Rash and other nonspecific skin eruption: Secondary | ICD-10-CM | POA: Diagnosis not present

## 2023-05-07 DIAGNOSIS — E559 Vitamin D deficiency, unspecified: Secondary | ICD-10-CM | POA: Diagnosis not present

## 2023-05-07 DIAGNOSIS — E7800 Pure hypercholesterolemia, unspecified: Secondary | ICD-10-CM

## 2023-05-07 NOTE — Patient Instructions (Signed)
Follow up as scheduled or as needed We'll notify you of your lab results and make any changes if needed STOP the Rosuvastatin and monitor the blotches Call with any questions or concerns Stay Safe!  Stay Healthy! Hang in there!!

## 2023-05-07 NOTE — Progress Notes (Unsigned)
   Subjective:    Patient ID: [name] [name], female    DOB: 07/29/1955, 67 y.o.   MRN: [id number]  HPI Rash- 'i've got blotches'.  Pt reports she has areas on stomach, thighs.  First appeared on the thighs, then under bra line.  Blotches are flush w/ skin- not able to feel them.  No itching or burning.  Pt feels sxs started around the same time she started the statin- about 4 months ago.   Review of Systems For ROS see HPI     Objective:   Physical Exam Vitals reviewed.  Constitutional:      General: She is not in acute distress.    Appearance: Normal appearance. She is not ill-appearing.  HENT:     Head: Normocephalic and atraumatic.  Skin:    General: Skin is warm and dry.     Findings: Rash (well demarcated ovoid patches over trunk and thighs) present.  Neurological:     General: No focal deficit present.     Mental Status: She is alert and oriented to person, place, and time.  Psychiatric:        Mood and Affect: Mood normal.        Behavior: Behavior normal.        Thought Content: Thought content normal.           Assessment & Plan:   Rash- new.  'Blotches' are flush with skin, do not hurt or itch, and coincide with starting Crestor.  This can be a side effect.  Will hold statin and see if rash improves.  Pt expressed understanding and is in agreement w/ plan.

## 2023-05-08 LAB — LIPID PANEL
Cholesterol: 182 mg/dL (ref 0–200)
HDL: 71.8 mg/dL (ref >39.00)
LDL Cholesterol: 86 mg/dL (ref 0–99)
NonHDL: 110.1
Total CHOL/HDL Ratio: 3
Triglycerides: 119 mg/dL (ref 0.0–149.0)
VLDL: 23.8 mg/dL (ref 0.0–40.0)

## 2023-05-08 LAB — HEPATIC FUNCTION PANEL
ALT: 60 U/L — ABNORMAL HIGH (ref 0–35)
AST: 32 U/L (ref 0–37)
Albumin: 4.5 g/dL (ref 3.5–5.2)
Alkaline Phosphatase: 55 U/L (ref 39–117)
Bilirubin, Direct: 0.2 mg/dL (ref 0.0–0.3)
Total Bilirubin: 0.8 mg/dL (ref 0.2–1.2)
Total Protein: 7.1 g/dL (ref 6.0–8.3)

## 2023-05-08 LAB — CBC WITH DIFFERENTIAL/PLATELET
Basophils Absolute: 0.1 10*3/uL (ref 0.0–0.1)
Basophils Relative: 0.9 % (ref 0.0–3.0)
Eosinophils Absolute: 0.1 10*3/uL (ref 0.0–0.7)
Eosinophils Relative: 2.3 % (ref 0.0–5.0)
HCT: 41 % (ref 36.0–46.0)
Hemoglobin: 13.5 g/dL (ref 12.0–15.0)
Lymphocytes Relative: 30.9 % (ref 12.0–46.0)
Lymphs Abs: 1.8 10*3/uL (ref 0.7–4.0)
MCHC: 32.9 g/dL (ref 30.0–36.0)
MCV: 96.2 fL (ref 78.0–100.0)
Monocytes Absolute: 0.8 10*3/uL (ref 0.1–1.0)
Monocytes Relative: 13.3 % — ABNORMAL HIGH (ref 3.0–12.0)
Neutro Abs: 3.1 10*3/uL (ref 1.4–7.7)
Neutrophils Relative %: 52.6 % (ref 43.0–77.0)
Platelets: 194 10*3/uL (ref 150.0–400.0)
RBC: 4.26 Mil/uL (ref 3.87–5.11)
RDW: 14 % (ref 11.5–15.5)
WBC: 5.9 10*3/uL (ref 4.0–10.5)

## 2023-05-08 LAB — BASIC METABOLIC PANEL
BUN: 15 mg/dL (ref 6–23)
CO2: 30 meq/L (ref 19–32)
Calcium: 9.6 mg/dL (ref 8.4–10.5)
Chloride: 102 meq/L (ref 96–112)
Creatinine, Ser: 0.76 mg/dL (ref 0.40–1.20)
GFR: 80.8 mL/min (ref >60.00)
Glucose, Bld: 94 mg/dL (ref 70–99)
Potassium: 4.7 meq/L (ref 3.5–5.1)
Sodium: 138 meq/L (ref 135–145)

## 2023-05-08 LAB — TSH: TSH: 1.09 u[IU]/mL (ref 0.35–5.50)

## 2023-05-08 LAB — VITAMIN D 25 HYDROXY (VIT D DEFICIENCY, FRACTURES): VITD: 31.51 ng/mL (ref 30.00–100.00)

## 2023-05-13 DIAGNOSIS — C44319 Basal cell carcinoma of skin of other parts of face: Secondary | ICD-10-CM | POA: Diagnosis not present

## 2023-05-13 DIAGNOSIS — L814 Other melanin hyperpigmentation: Secondary | ICD-10-CM | POA: Diagnosis not present

## 2023-05-13 DIAGNOSIS — L821 Other seborrheic keratosis: Secondary | ICD-10-CM | POA: Diagnosis not present

## 2023-05-13 DIAGNOSIS — D225 Melanocytic nevi of trunk: Secondary | ICD-10-CM | POA: Diagnosis not present

## 2023-05-13 DIAGNOSIS — D492 Neoplasm of unspecified behavior of bone, soft tissue, and skin: Secondary | ICD-10-CM | POA: Diagnosis not present

## 2023-05-17 ENCOUNTER — Encounter: Payer: Self-pay | Admitting: Family Medicine

## 2023-05-17 ENCOUNTER — Ambulatory Visit (INDEPENDENT_AMBULATORY_CARE_PROVIDER_SITE_OTHER): Payer: Medicare Other | Admitting: Family Medicine

## 2023-05-17 VITALS — BP 104/70 | HR 66 | Temp 98.5°F | Ht 68.0 in | Wt 180.4 lb

## 2023-05-17 DIAGNOSIS — R748 Abnormal levels of other serum enzymes: Secondary | ICD-10-CM

## 2023-05-17 DIAGNOSIS — Z0000 Encounter for general adult medical examination without abnormal findings: Secondary | ICD-10-CM | POA: Diagnosis not present

## 2023-05-17 NOTE — Patient Instructions (Signed)
Follow up in 6 months to recheck cholesterol We'll notify you of your lab results and make any changes if needed Keep up the good work on healthy diet and regular exercise- you can do it! Call with any questions or concerns Stay Safe!  Stay Healthy! Happy Holidays!! ENJOY YOUR TRIP!!!

## 2023-05-17 NOTE — Progress Notes (Signed)
   Subjective:    Patient ID: [name] [name], female    DOB: 05/01/1956, 66 y.o.   MRN: [id number]  HPI CPE- UTD on mammo, Colonoscopy, DEXA, Tdap.  Declines flu and PNA  Patient Care Team    Relationship Specialty Notifications Start End  Sheliah Hatch, MD PCP - General Family Medicine  11/02/15   Chilton Si, MD PCP - Cardiology Cardiology  01/18/21   Regan Lemming, MD PCP - Electrophysiology Cardiology  02/28/22   Serena Croissant, MD Consulting Physician Hematology and Oncology  11/02/15      Health Maintenance  Topic Date Due   Medicare Annual Wellness (AWV)  Never done   Pneumonia Vaccine 31+ Years old (1 of 1 - PCV) Never done   INFLUENZA VACCINE  12/27/2022   COVID-19 Vaccine (5 - 2024-25 season) 01/27/2023   MAMMOGRAM  11/22/2024   DTaP/Tdap/Td (3 - Td or Tdap) 11/01/2025   Colonoscopy  02/12/2033   DEXA SCAN  Completed   Hepatitis C Screening  Completed   Zoster Vaccines- Shingrix  Completed   HPV VACCINES  Aged Out     Review of Systems Patient reports no vision/ hearing changes, adenopathy,fever, weight change,  persistant/recurrent hoarseness , swallowing issues, chest pain, palpitations, edema, persistant/recurrent cough, hemoptysis, dyspnea (rest/exertional/paroxysmal nocturnal), gastrointestinal bleeding (melena, rectal bleeding), abdominal pain, significant heartburn, bowel changes, GU symptoms (dysuria, hematuria, incontinence), Gyn symptoms (abnormal  bleeding, pain),  syncope, focal weakness, memory loss, numbness & tingling, hair/nail changes, abnormal bruising or bleeding, anxiety, or depression.     Objective:   Physical Exam General Appearance:    Alert, cooperative, no distress, appears stated age  Head:    Normocephalic, without obvious abnormality, atraumatic  Eyes:    PERRL, conjunctiva/corneas clear, EOM's intact both eyes  Ears:    Normal TM's and external ear canals, both ears  Nose:   Nares normal, septum midline, mucosa normal, no  drainage    or sinus tenderness  Throat:   Lips, mucosa, and tongue normal; teeth and gums normal  Neck:   Supple, symmetrical, trachea midline, no adenopathy;    Thyroid: no enlargement/tenderness/nodules  Back:     Symmetric, no curvature, ROM normal, no CVA tenderness  Lungs:     Clear to auscultation bilaterally, respirations unlabored  Chest Wall:    No tenderness or deformity   Heart:    Regular rate and rhythm, S1 and S2 normal, no murmur, rub   or gallop  Breast Exam:    Deferred to mammo  Abdomen:     Soft, non-tender, bowel sounds active all four quadrants,    no masses, no organomegaly  Genitalia:    Deferred  Rectal:    Extremities:   Extremities normal, atraumatic, no cyanosis or edema  Pulses:   2+ and symmetric all extremities  Skin:   Skin color, texture, turgor normal  Lymph nodes:   Cervical, supraclavicular, and axillary nodes normal  Neurologic:   CNII-XII intact, normal strength, sensation and reflexes    throughout          Assessment & Plan:

## 2023-05-17 NOTE — Assessment & Plan Note (Signed)
Pt's PE WNL.  UTD on mammo, colonoscopy, DEXA, Tdap.  Declines flu and PNA.  Reviewed labs done at last visit.  Will repeat LFTs to ensure these are decreasing or back in normal range.  Anticipatory guidance provided.

## 2023-05-18 LAB — HEPATIC FUNCTION PANEL
AG Ratio: 1.8 (calc) (ref 1.0–2.5)
ALT: 31 U/L — ABNORMAL HIGH (ref 6–29)
AST: 24 U/L (ref 10–35)
Albumin: 4.4 g/dL (ref 3.6–5.1)
Alkaline phosphatase (APISO): 57 U/L (ref 37–153)
Bilirubin, Direct: 0.1 mg/dL (ref 0.0–0.2)
Globulin: 2.4 g/dL (ref 1.9–3.7)
Indirect Bilirubin: 0.4 mg/dL (ref 0.2–1.2)
Total Bilirubin: 0.5 mg/dL (ref 0.2–1.2)
Total Protein: 6.8 g/dL (ref 6.1–8.1)

## 2023-05-20 ENCOUNTER — Telehealth: Payer: Self-pay

## 2023-05-20 NOTE — Telephone Encounter (Signed)
Pt has been notified.

## 2023-05-20 NOTE — Telephone Encounter (Signed)
-----   Message from Neena Rhymes sent at 05/20/2023  7:54 AM EST ----- Liver functions are now normal- this is great news!

## 2023-05-24 ENCOUNTER — Ambulatory Visit: Payer: Self-pay | Admitting: Family Medicine

## 2023-05-24 NOTE — Telephone Encounter (Signed)
Noted  

## 2023-05-24 NOTE — Telephone Encounter (Signed)
  Chief Complaint: UTI symptoms Symptoms: urinary frequency, pain with urination, cloudy urine Frequency: started today Pertinent Negatives: Patient denies fever Disposition: [] ED /[x] Urgent Care (no appt availability in office) / [] Appointment(In office/virtual)/ []  [facility]/ [] Home Care/ [] Refused Recommended Disposition /[]  Mobile Bus/ []  Follow-up with PCP Additional Notes: patient calling with c/o possible UTI. Patient endorses urinary frequency, pain with urination and cloudy urine. States she has had UTIs before and the symptoms feel very similar. No appointment availability until December 30-patient states she is unable to wait so recommendation of urgent care is given. Patient verbalizes understanding of plan. All questions answered.   Copied from CRM [id number]. Topic: Clinical - Pink Word Triage >> May 24, 2023 11:31 AM Fredrich Romans wrote: Reason for Triage: frequent urination,pain while urination,possible uti -symptoms started this morning Reason for Disposition  Urinating more frequently than usual (i.e., frequency)  Answer Assessment - Initial Assessment Questions 1. SYMPTOM: "What's the main symptom you're concerned about?" (e.g., frequency, incontinence)     Urinary frequency 2. ONSET: "When did the  urinary frequency  start?"     This morning 3. PAIN: "Is there any pain?" If Yes, ask: "How bad is it?" (Scale: 1-10; mild, moderate, severe)     4 out 10 4. CAUSE: "What do you think is causing the symptoms?"     Urinary tract infection 5. OTHER SYMPTOMS: "Do you have any other symptoms?" (e.g., blood in urine, fever, flank pain, pain with urination)     Cloudy, pain with urination  Protocols used: Urinary Symptoms-A-AH

## 2023-05-27 DIAGNOSIS — G4733 Obstructive sleep apnea (adult) (pediatric): Secondary | ICD-10-CM | POA: Diagnosis not present

## 2023-07-23 ENCOUNTER — Ambulatory Visit: Payer: Medicare Other | Admitting: Cardiology

## 2023-07-24 ENCOUNTER — Ambulatory Visit: Payer: Self-pay | Admitting: Podiatry

## 2023-07-25 NOTE — Progress Notes (Deleted)
  Electrophysiology Office Note:   Date:  07/25/2023  ID:  [name] [name], DOB 1956/01/16, MRN [id number]  Primary Cardiologist: Chilton Si, MD Primary Heart Failure: None Electrophysiologist: Will Jorja Loa, MD  {Click to update primary MD,subspecialty MD or APP then REFRESH:1}    History of Present Illness:   [name] [name] is a 68 y.o. female with h/o SVT s/p ablation 2004, breast cancer (R side) seen today for routine electrophysiology followup.   Since last being seen in our clinic the patient reports doing ***.  she denies chest pain, palpitations, dyspnea, PND, orthopnea, nausea, vomiting, dizziness, syncope, edema, weight gain, or early satiety.   Review of systems complete and found to be negative unless listed in HPI.   EP Information / Studies Reviewed:    EKG is ordered today. Personal review as below.      Studies:  Zio 11/2020 > predominant rhythm SR, 7 runs of SVT up to 4 beats, maximum HR in SVT 200 bpm, rare PVC/PAC's Coronary Calcium Score 03/2022 > CCS 0, mild aortic atherosclerosis  ECHO 10/2022 > LVEF 65-70%, G1DD, trivial mitral valve regurgitation   Arrhythmia / AAD PAC/PVC's     Risk Assessment/Calculations:     No BP recorded.  {Refresh Note OR Click here to enter BP  :1}***        Physical Exam:   VS:  There were no vitals taken for this visit.   Wt Readings from Last 3 Encounters:  05/17/23 180 lb 6 oz (81.8 kg)  05/07/23 183 lb (83 kg)  02/13/23 175 lb (79.4 kg)     GEN: Well nourished, well developed in no acute distress NECK: No JVD; No carotid bruits CARDIAC: {EPRHYTHM:28826}, no murmurs, rubs, gallops RESPIRATORY:  Clear to auscultation without rales, wheezing or rhonchi  ABDOMEN: Soft, non-tender, non-distended EXTREMITIES:  No edema; No deformity   ASSESSMENT AND PLAN:    SVT / Likely AT  PAC's / PVC's  Palpitations  -continue Toprol 12.5 mg am, 25 mg PM  -symptom burden improved on increased dose ? ***  OSA  -CPAP ?  ***compliance encouraged    Follow up with Dr. Elberta Fortis {EPFOLLOW WU:98119}  Signed, Canary Brim, NP-C, AGACNP-BC [facility] - Electrophysiology  07/25/2023, 8:41 PM

## 2023-07-29 ENCOUNTER — Encounter: Payer: Self-pay | Admitting: Podiatry

## 2023-07-29 ENCOUNTER — Ambulatory Visit (INDEPENDENT_AMBULATORY_CARE_PROVIDER_SITE_OTHER): Payer: Medicare Other | Admitting: Podiatry

## 2023-07-29 DIAGNOSIS — L84 Corns and callosities: Secondary | ICD-10-CM

## 2023-07-29 NOTE — Progress Notes (Signed)
 Chief Complaint  Patient presents with   Debridement    Requesting toenail trim and filing, also wanting her heels filed down, she has been in [place] visiting family for 7 weeks   New Patient (Initial Visit)    HPI: 68 y.o. female presenting today as a new patient requesting a full pedicure.  Patient states that she would like her calluses smoothed around her heels and her nails trimmed and hoping to have a pedicure.  She is from [place] and apparently podiatrist in [place] are nonsurgical and perform routine medical grade pedicures.  She is requesting that today.  Past Medical History:  Diagnosis Date   Cancer of right breast (HCC) 2016   last radiation was 01/2015   Dysrhythmia    Hyperlipidemia    on meds   Hypertension    on meds   Lipoma of abdominal wall    OSA (obstructive sleep apnea) 04/17/2016   CPAP used nightly   Paroxysmal SVT (supraventricular tachycardia) (HCC) 2004   Status post RFA ABLATION   S/P radiation therapy 12/28/2014 through 01/27/2015                                                     Right breast 4250 cGy 17 sessions, right breast boost 1200 cGy in 6 sessions                          Past Surgical History:  Procedure Laterality Date   ABDOMINAL HYSTERECTOMY  1999   WITH BLADDER REPAIR FOR PROPLAPSE-HAS OVARIES (NO CA)   BREAST LUMPECTOMY WITH RADIOACTIVE SEED AND SENTINEL LYMPH NODE BIOPSY Right 11/02/2014   Procedure: RIGHT BREAST LUMPECTOMY WITH RADIOACTIVE SEED AND RIGHT AXILLARY SENTINEL LYMPH NODE BIOPSY;  Surgeon: Glenna Fellows, MD;  Location: [facility];  Service: General;  Laterality: Right;   COLONOSCOPY  2017   HD-MAC-suprep(good)-hems/TA   KNEE CARTILAGE SURGERY     LIPOMA EXCISION N/A 03/23/2015   Procedure: EXCISION LIPOMA ABDOMINAL WALLL;  Surgeon: Glenna Fellows, MD;  Location: [facility];  Service: General;  Laterality: N/A;   POLYPECTOMY  2017   TA   STAPEDECTOMY Right    SVT ablation   2000    No Known Allergies   Patient's feet are otherwise normal.  I explained to the patient that we do not really offer pedicures here in our practice.  We are surgical podiatrist and also perform preventative care for high risk patients.  Patient was very understanding.  She did have a very small corn to the left fifth digit which I lightly debrided today with a 312 scalpel without incident or bleeding.  Felecia Shelling, DPM [facility]  Dr. Felecia Shelling, DPM    [address] [place] [address]                Office [phone]  Fax [phone]         Felecia Shelling, DPM [facility]  Dr. Felecia Shelling, DPM    [address]  [place], [place] [address]                Office [phone]  Fax [phone]

## 2023-07-30 ENCOUNTER — Ambulatory Visit: Payer: Medicare Other | Admitting: Pulmonary Disease

## 2023-07-30 DIAGNOSIS — I4719 Other supraventricular tachycardia: Secondary | ICD-10-CM

## 2023-07-30 DIAGNOSIS — I471 Supraventricular tachycardia, unspecified: Secondary | ICD-10-CM

## 2023-07-30 DIAGNOSIS — R002 Palpitations: Secondary | ICD-10-CM

## 2023-07-30 DIAGNOSIS — C44319 Basal cell carcinoma of skin of other parts of face: Secondary | ICD-10-CM | POA: Diagnosis not present

## 2023-08-01 DIAGNOSIS — H906 Mixed conductive and sensorineural hearing loss, bilateral: Secondary | ICD-10-CM | POA: Diagnosis not present

## 2023-08-13 DIAGNOSIS — H8002 Otosclerosis involving oval window, nonobliterative, left ear: Secondary | ICD-10-CM | POA: Diagnosis not present

## 2023-08-13 DIAGNOSIS — Z79899 Other long term (current) drug therapy: Secondary | ICD-10-CM | POA: Diagnosis not present

## 2023-08-13 DIAGNOSIS — H90A32 Mixed conductive and sensorineural hearing loss, unilateral, left ear with restricted hearing on the contralateral side: Secondary | ICD-10-CM | POA: Diagnosis not present

## 2023-08-15 ENCOUNTER — Ambulatory Visit: Admitting: Pulmonary Disease

## 2023-08-28 DIAGNOSIS — L309 Dermatitis, unspecified: Secondary | ICD-10-CM | POA: Diagnosis not present

## 2023-08-28 DIAGNOSIS — L570 Actinic keratosis: Secondary | ICD-10-CM | POA: Diagnosis not present

## 2023-09-05 ENCOUNTER — Ambulatory Visit: Admitting: Family Medicine

## 2023-09-05 ENCOUNTER — Inpatient Hospital Stay: Payer: Medicare Other | Attending: Hematology and Oncology | Admitting: Hematology and Oncology

## 2023-09-05 DIAGNOSIS — Z923 Personal history of irradiation: Secondary | ICD-10-CM | POA: Diagnosis not present

## 2023-09-05 DIAGNOSIS — Z79899 Other long term (current) drug therapy: Secondary | ICD-10-CM | POA: Insufficient documentation

## 2023-09-05 DIAGNOSIS — R233 Spontaneous ecchymoses: Secondary | ICD-10-CM | POA: Insufficient documentation

## 2023-09-05 DIAGNOSIS — C50411 Malignant neoplasm of upper-outer quadrant of right female breast: Secondary | ICD-10-CM | POA: Diagnosis not present

## 2023-09-05 DIAGNOSIS — Z853 Personal history of malignant neoplasm of breast: Secondary | ICD-10-CM | POA: Insufficient documentation

## 2023-09-05 DIAGNOSIS — Z87891 Personal history of nicotine dependence: Secondary | ICD-10-CM | POA: Diagnosis not present

## 2023-09-05 DIAGNOSIS — R21 Rash and other nonspecific skin eruption: Secondary | ICD-10-CM | POA: Diagnosis not present

## 2023-09-05 NOTE — Assessment & Plan Note (Signed)
 Right lumpectomy 11/02/2014: Invasive ductal carcinoma with extracellular mucin, grade 2/3, 1.8 cm, 0/2 lymph nodes negative, ER 98%, PR 63%, HER-2 negative ratio 1.58, Ki-67 7%, Oncotype DX score 12, 8% risk of recurrence, T1 cN0 stage IA status post radiation completed 01/27/2015   Current treatment: Anastrozole adjuvant therapy 1 mg daily 7 years started October 2016 completed March 2023 Anastrozole toxicities:   1. Hot flashes Have resolved since she started taking half a tablet twice a day Since she completed 7 years of therapy we decided to stop it at this time.   Patient frequently travels to [place] to see her granddaughter who is now 21 years old and and new grandson 38 months old.  This year she is planning to travel to [facility] areas.   Breast Cancer Surveillance: 1. Breast exam 09/04/2023 tenderness in the right breast: Benign 2. Mammogram 11/23/2022 at [facility] benign. Postsurgical changes.  Breast density category C    Return to clinic in 1 year for follow-up

## 2023-09-05 NOTE — Progress Notes (Signed)
 Patient Care Team: Sheliah Hatch, MD as PCP - General (Family Medicine) Chilton Si, MD as PCP - Cardiology (Cardiology) Regan Lemming, MD as PCP - Electrophysiology (Cardiology) Serena Croissant, MD as Consulting Physician (Hematology and Oncology)  DIAGNOSIS:  Encounter Diagnosis  Name Primary?   Primary cancer of upper outer quadrant of right breast (HCC) Yes    SUMMARY OF ONCOLOGIC HISTORY: Oncology History  Primary cancer of upper outer quadrant of right breast (HCC)  09/30/2014 Initial Diagnosis   Right breast invasive ductal carcinoma grade 1, Ki-67 5%, ER positive, PR positive, HER-2 negative, 1.4 cm mass at 11:30 position no lymph nodes by ultrasound T1 cN0 M0 stage IA clinical stage   11/02/2014 Surgery   Right lumpectomy: Invasive ductal carcinoma with extracellular mucin, grade 2/3, 1.8 cm, 0/2 lymph nodes negative, ER 98%, PR 63%, HER-2 negative ratio 1.58, Ki-67 7%, Oncotype DX score 12, 8% risk of recurrence, T1 cN0 stage IA   12/28/2014 - 01/27/2015 Radiation Therapy   Adjuvant radiation therapy with boost   03/01/2015 - 08/29/2021 Anti-estrogen oral therapy   Anastrozole 1 mg daily     CHIEF COMPLIANT: Surveillance of breast cancer  HISTORY OF PRESENT ILLNESS: History of Present Illness The patient presents with a persistent, non-pruritic, non-palpable rash on the abdomen that has been present since October of the previous year. The rash, described as 'blotchy,' is localized to the anterior abdomen and does not extend past the umbilicus. The patient denies associated symptoms such as itching or lumps. The rash worsens with heat exposure, such as after a warm shower. The patient reports feeling otherwise well and has no other complaints. The patient has seen a dermatologist and stopped statin medication in December due to suspicion that it might be causing the rash, but there has been no improvement.     ALLERGIES:  has no known allergies.  MEDICATIONS:   Current Outpatient Medications  Medication Sig Dispense Refill   Cholecalciferol (VITAMIN D3) 2000 units TABS Take 2,000 Units by mouth daily.     metoprolol succinate (TOPROL XL) 25 MG 24 hr tablet Take 0.5 tablets (12.5 mg total) by mouth daily. AND Take a whole tablet  25 mg in the pm 135 tablet 2   vitamin E 1000 UNIT capsule Take 1,000 Units by mouth daily.     No current facility-administered medications for this visit.    PHYSICAL EXAMINATION: ECOG PERFORMANCE STATUS: 1 - Symptomatic but completely ambulatory  There were no vitals filed for this visit. There were no vitals filed for this visit.  Physical Exam No palpable lumps or nodules in bilateral breasts or axilla  (exam performed in the presence of a chaperone)  LABORATORY DATA:  I have reviewed the data as listed    Latest Ref Rng & Units 05/17/2023    3:04 PM 05/07/2023   11:48 AM 01/11/2023    8:23 AM  CMP  Glucose 70 - 99 mg/dL  94    BUN 6 - 23 mg/dL  15    Creatinine 8.46 - 1.20 mg/dL  9.62    Sodium 952 - 841 mEq/L  138    Potassium 3.5 - 5.1 mEq/L  4.7    Chloride 96 - 112 mEq/L  102    CO2 19 - 32 mEq/L  30    Calcium 8.4 - 10.5 mg/dL  9.6    Total Protein 6.1 - 8.1 g/dL 6.8  7.1  6.9   Total Bilirubin 0.2 - 1.2 mg/dL 0.5  0.8  0.6   Alkaline Phos 39 - 117 U/L  55  53   AST 10 - 35 U/L 24  32  17   ALT 6 - 29 U/L 31  60  16     Lab Results  Component Value Date   WBC 5.9 05/07/2023   HGB 13.5 05/07/2023   HCT 41.0 05/07/2023   MCV 96.2 05/07/2023   PLT 194.0 05/07/2023   NEUTROABS 3.1 05/07/2023    ASSESSMENT & PLAN:  Primary cancer of upper outer quadrant of right breast Right lumpectomy 11/02/2014: Invasive ductal carcinoma with extracellular mucin, grade 2/3, 1.8 cm, 0/2 lymph nodes negative, ER 98%, PR 63%, HER-2 negative ratio 1.58, Ki-67 7%, Oncotype DX score 12, 8% risk of recurrence, T1 cN0 stage IA status post radiation completed 01/27/2015   Current treatment: Anastrozole  adjuvant therapy 1 mg daily 7 years started October 2016 completed March 2023  Patient frequently travels to [place] to see her granddaughter who is now 58 years old and and new grandson 29 months old.   Breast Cancer Surveillance: 1. Breast exam 09/04/2023 tenderness in the right breast: Benign 2. Mammogram 11/23/2022 at [facility] benign. Postsurgical changes.  Breast density category C    Return to clinic in 1 year for follow-up Assessment & Plan Unexplained abdominal rash Persistent abdominal rash since October, non-pruritic, non-lumpy, worsens with heat. Stopping statin showed no improvement. Differential includes allergic reaction (unlikely due to lack of itching/spread) and panniculitis. Full blood work and allergist consultation planned. Skin biopsy considered if no cause identified, contingent on blood work and allergist evaluation results. - Complete full blood work. - Refer to allergist for further evaluation. - Consider skin biopsy if no cause is identified.      No orders of the defined types were placed in this encounter.  The patient has a good understanding of the overall plan. she agrees with it. she will call with any problems that may develop before the next visit here. Total time spent: 30 mins including face to face time and time spent for planning, charting and co-ordination of care   Tamsen Meek, MD 09/05/23

## 2023-09-06 ENCOUNTER — Ambulatory Visit (INDEPENDENT_AMBULATORY_CARE_PROVIDER_SITE_OTHER): Admitting: Family Medicine

## 2023-09-06 ENCOUNTER — Encounter: Payer: Self-pay | Admitting: Family Medicine

## 2023-09-06 VITALS — BP 104/68 | HR 68 | Temp 97.9°F | Ht 68.0 in | Wt 181.1 lb

## 2023-09-06 DIAGNOSIS — Z87891 Personal history of nicotine dependence: Secondary | ICD-10-CM | POA: Diagnosis not present

## 2023-09-06 DIAGNOSIS — R21 Rash and other nonspecific skin eruption: Secondary | ICD-10-CM | POA: Diagnosis not present

## 2023-09-06 DIAGNOSIS — Z79899 Other long term (current) drug therapy: Secondary | ICD-10-CM | POA: Diagnosis not present

## 2023-09-06 DIAGNOSIS — Z853 Personal history of malignant neoplasm of breast: Secondary | ICD-10-CM | POA: Diagnosis not present

## 2023-09-06 DIAGNOSIS — R233 Spontaneous ecchymoses: Secondary | ICD-10-CM | POA: Diagnosis not present

## 2023-09-06 DIAGNOSIS — Z923 Personal history of irradiation: Secondary | ICD-10-CM | POA: Diagnosis not present

## 2023-09-06 LAB — BASIC METABOLIC PANEL WITH GFR
BUN: 16 mg/dL (ref 6–23)
CO2: 29 meq/L (ref 19–32)
Calcium: 9.1 mg/dL (ref 8.4–10.5)
Chloride: 102 meq/L (ref 96–112)
Creatinine, Ser: 0.73 mg/dL (ref 0.40–1.20)
GFR: 84.6 mL/min (ref >60.00)
Glucose, Bld: 106 mg/dL — ABNORMAL HIGH (ref 70–99)
Potassium: 5.1 meq/L (ref 3.5–5.1)
Sodium: 136 meq/L (ref 135–145)

## 2023-09-06 LAB — CBC WITH DIFFERENTIAL/PLATELET
Basophils Absolute: 0 10*3/uL (ref 0.0–0.1)
Basophils Relative: 0.7 % (ref 0.0–3.0)
Eosinophils Absolute: 0.1 10*3/uL (ref 0.0–0.7)
Eosinophils Relative: 2.9 % (ref 0.0–5.0)
HCT: 40.1 % (ref 36.0–46.0)
Hemoglobin: 13.6 g/dL (ref 12.0–15.0)
Lymphocytes Relative: 32 % (ref 12.0–46.0)
Lymphs Abs: 1.4 10*3/uL (ref 0.7–4.0)
MCHC: 33.9 g/dL (ref 30.0–36.0)
MCV: 94.6 fl (ref 78.0–100.0)
Monocytes Absolute: 0.5 10*3/uL (ref 0.1–1.0)
Monocytes Relative: 11.5 % (ref 3.0–12.0)
Neutro Abs: 2.3 10*3/uL (ref 1.4–7.7)
Neutrophils Relative %: 52.9 % (ref 43.0–77.0)
Platelets: 208 10*3/uL (ref 150.0–400.0)
RBC: 4.24 Mil/uL (ref 3.87–5.11)
RDW: 13.3 % (ref 11.5–15.5)
WBC: 4.4 10*3/uL (ref 4.0–10.5)

## 2023-09-06 LAB — B12 AND FOLATE PANEL
Folate: 16 ng/mL (ref >5.9)
Vitamin B-12: 217 pg/mL (ref 211–911)

## 2023-09-06 LAB — HEPATIC FUNCTION PANEL
ALT: 12 U/L (ref 0–35)
AST: 14 U/L (ref 0–37)
Albumin: 4.3 g/dL (ref 3.5–5.2)
Alkaline Phosphatase: 44 U/L (ref 39–117)
Bilirubin, Direct: 0.1 mg/dL (ref 0.0–0.3)
Total Bilirubin: 0.8 mg/dL (ref 0.2–1.2)
Total Protein: 6.3 g/dL (ref 6.0–8.3)

## 2023-09-06 LAB — APTT: aPTT: 28 s (ref 25.4–36.8)

## 2023-09-06 NOTE — Patient Instructions (Signed)
 Follow up as needed or as scheduled We'll notify you of your lab results and make any changes if needed If the labs don't point to anything in particular, we can go back to derm for the biopsy Call with any questions or concerns Stay Safe!  Stay Healthy! Happy Spring!!

## 2023-09-06 NOTE — Progress Notes (Signed)
   Subjective:    Patient ID: [name] [name], female    DOB: [birth year], 68 y.o.   MRN: [id number]  HPI Rash- localized to abdomen.  No itching.  Has been present since Fall 2024.  Areas have faded in color but more areas have arisen.  No pain.  No relief w/ stopping statin.  Saw Derm last week and 'he was baffled'     Review of Systems For ROS see HPI     Objective:   Physical Exam Vitals reviewed.  Constitutional:      General: She is not in acute distress.    Appearance: Normal appearance. She is not ill-appearing.  HENT:     Head: Normocephalic and atraumatic.  Eyes:     Extraocular Movements: Extraocular movements intact.     Conjunctiva/sclera: Conjunctivae normal.  Lymphadenopathy:     Cervical: No cervical adenopathy.  Skin:    General: Skin is warm and dry.     Findings: Erythema (faintly erythematous- almost bruiselike in appearance- macules on abdomen) present.  Neurological:     General: No focal deficit present.     Mental Status: She is alert and oriented to person, place, and time.  Psychiatric:        Mood and Affect: Mood normal.        Behavior: Behavior normal.        Thought Content: Thought content normal.           Assessment & Plan:  Rash- ongoing.  She has seen Derm w/o identification.  Today lesions almost appear bruise-like.  Will get labs to assess for possible autoimmune conditions, vitamin deficiencies, and easy bruising.  If labs are unrevealing, encouraged her to pursue biopsy.  Pt expressed understanding and is in agreement w/ plan.

## 2023-09-07 ENCOUNTER — Encounter: Payer: Self-pay | Admitting: Family Medicine

## 2023-09-09 NOTE — Telephone Encounter (Signed)
-----   Message from Laymon Priest sent at 09/07/2023  8:18 AM EDT ----- Labs look good so far!  Still waiting on a few more.  B12 is at the low end of normal, so adding a daily Vit B12 supplement will improve this number.

## 2023-09-09 NOTE — Progress Notes (Unsigned)
 Electrophysiology Office Note:   Date:  09/10/2023  ID:  [name] [name], DOB 26-Mar-1956, MRN [id number]  Primary Cardiologist: Chilton Si, MD Primary Heart Failure: None Electrophysiologist: Will Jorja Loa, MD      History of Present Illness:   [name] [name] is a 68 y.o. female with h/o SVT, HLD, right breast CA s/p lumpectomy, XRT, anastrozole seen today for routine electrophysiology followup.   Since last being seen in our clinic the patient reports she has largely been doing well. She has been taking Toprol 12.5 mg BID.  She has infrequent episodes but when they occur, she is symptomatic from the event.  Most recently she had an episode while playing pickle ball that lasted 2-3 minutes and spontaneously resolved. She feels she has been having episodes around 0530 in the am at times. She wakes up and wonders if it was her heart rhythm that was causing the issue.    She speaks of her son who passed away at the age of 34 from a tachy-mediated cardiomyopathy. Her father and 4 of her 10 siblings have had issues with tachycardia.   She denies chest pain, palpitations, dyspnea, PND, orthopnea, nausea, vomiting, dizziness, syncope, edema, weight gain, or early satiety.   Review of systems complete and found to be negative unless listed in HPI.   EP Information / Studies Reviewed:    EKG is ordered today. Personal review as below.  EKG Interpretation Date/Time:  Tuesday September 10 2023 08:01:41 EDT Ventricular Rate:  60 PR Interval:  174 QRS Duration:  80 QT Interval:  428 QTC Calculation: 428 R Axis:   6  Text Interpretation: Normal sinus rhythm Confirmed by Canary Brim ([id number]) on 09/10/2023 8:07:26 AM   Studies:  Cardiac Monitor 12/2020 > predominant rhythm SR, ave 71 bpm, min 47 bpm - 139 bpm max, 7 runs of SVT up to 4 beats, maximum HR in SVT 200 bpm, rare PVC's / PAC's  Coronary Calcium Score 02/2022 > CCS of 0 ECHO 10/2022 > LVEF 65-70%, G1DD, LA mildly dilated, trivial  mitral valve regurgitation   Arrhythmia / AAD SVT > s/p ablation 2004            Physical Exam:   VS:  BP 118/72   Pulse 60   Ht 5\' 8"  (1.727 m)   Wt 179 lb (81.2 kg)   SpO2 97%   BMI 27.22 kg/m    Wt Readings from Last 3 Encounters:  09/10/23 179 lb (81.2 kg)  09/06/23 181 lb 2 oz (82.2 kg)  05/17/23 180 lb 6 oz (81.8 kg)     GEN: Well nourished, well developed in no acute distress NECK: No JVD; No carotid bruits CARDIAC: Regular rate and rhythm, no murmurs, rubs, gallops RESPIRATORY:  Clear to auscultation without rales, wheezing or rhonchi  ABDOMEN: Soft, non-tender, non-distended > fine, faintly erythematous rash on her abdomen / splotchy patches / not confluent, non-raised EXTREMITIES:  No edema; No deformity   ASSESSMENT AND PLAN:    SVT  Palpitations Suspected AT  -continue Toprol, consolidate to 25mg  at bedtime  -reviewed if she feels she does not have control, we can add an additional 12.5 mg to her dose  -she is pending work up with her PCP for torso rash > not raised / pruritic > reviewed Toprol and can cause rash but would be an unusual presentation as it is only on her abdomen  -could consider AAD / flecainide if needed -reviewed consideration for EPS and possible ablation  -EKG  with NSR   Abdominal Rash  -non-pruritic, non-confluent, on trunk only  -no eosinophilia on differential, negative ANA -ongoing work up per Dermatology    Follow up with Dr. Lawana Pray or EP APP  3 months   Signed, Creighton Doffing, NP-C, AGACNP-BC [facility] - Electrophysiology  09/10/2023, 8:37 AM

## 2023-09-09 NOTE — Telephone Encounter (Signed)
 Called to relay lab results, Left VM to return call or can view via mychart.

## 2023-09-10 ENCOUNTER — Encounter: Payer: Self-pay | Admitting: Pulmonary Disease

## 2023-09-10 ENCOUNTER — Ambulatory Visit: Attending: Pulmonary Disease | Admitting: Pulmonary Disease

## 2023-09-10 VITALS — BP 118/72 | HR 60 | Ht 68.0 in | Wt 179.0 lb

## 2023-09-10 DIAGNOSIS — I471 Supraventricular tachycardia, unspecified: Secondary | ICD-10-CM

## 2023-09-10 DIAGNOSIS — E559 Vitamin D deficiency, unspecified: Secondary | ICD-10-CM | POA: Diagnosis not present

## 2023-09-10 DIAGNOSIS — R002 Palpitations: Secondary | ICD-10-CM

## 2023-09-10 DIAGNOSIS — I4719 Other supraventricular tachycardia: Secondary | ICD-10-CM

## 2023-09-10 MED ORDER — METOPROLOL SUCCINATE ER 25 MG PO TB24: ORAL_TABLET | ORAL | Status: DC

## 2023-09-10 NOTE — Patient Instructions (Signed)
 Medication Instructions:  Take metoprolol succinate (Toprol XL) 25 mg daily at bedtime, you may take an extra 12.5 mg (1/2 tab) if needed for palpitations. *If you need a refill on your cardiac medications before your next appointment, please call your pharmacy*  Lab Work: None ordered If you have labs (blood work) drawn today and your tests are completely normal, you will receive your results only by: MyChart Message (if you have MyChart) OR A paper copy in the mail If you have any lab test that is abnormal or we need to change your treatment, we will call you to review the results.  Follow-Up: At [facility], you and your health needs are our priority.  As part of our continuing mission to provide you with exceptional heart care, our providers are all part of one team.  This team includes your primary Cardiologist (physician) and Advanced Practice Providers or APPs (Physician Assistants and Nurse Practitioners) who all work together to provide you with the care you need, when you need it.  Your next appointment:   3 month(s)  Provider:   Creighton Doffing, NP       1st Floor: - Lobby - Registration  - Pharmacy  - Lab - Cafe  2nd Floor: - PV Lab - Diagnostic Testing (echo, CT, nuclear med)  3rd Floor: - Vacant  4th Floor: - TCTS (cardiothoracic surgery) - AFib Clinic - Structural Heart Clinic - Vascular Surgery  - Vascular Ultrasound  5th Floor: - HeartCare Cardiology (general and EP) - Clinical Pharmacy for coumadin, hypertension, lipid, weight-loss medications, and med management appointments    Valet parking services will be available as well.

## 2023-09-10 NOTE — Telephone Encounter (Signed)
 Copied from CRM [id number]. Topic: Clinical - Lab/Test Results >> Sep 10, 2023  8:52 AM Albertha Alosa wrote: Reason for CRM: Patient called returning call regarding lab results, relayed results, patient understood and had no further questions

## 2023-09-11 LAB — EXTRA SPECIMEN

## 2023-09-11 LAB — ZINC: Zinc: 82 ug/dL (ref 60–130)

## 2023-09-11 LAB — VITAMIN C

## 2023-09-11 LAB — ANA: Anti Nuclear Antibody (ANA): NEGATIVE

## 2023-09-16 ENCOUNTER — Telehealth: Payer: Self-pay

## 2023-09-16 DIAGNOSIS — E7800 Pure hypercholesterolemia, unspecified: Secondary | ICD-10-CM

## 2023-09-16 DIAGNOSIS — E785 Hyperlipidemia, unspecified: Secondary | ICD-10-CM

## 2023-09-16 NOTE — Telephone Encounter (Signed)
 She needs a lipid panel (dx hyperlipidemia)

## 2023-09-16 NOTE — Telephone Encounter (Signed)
-----   Message from Laymon Priest sent at 09/16/2023  1:43 PM EDT ----- I'm sorry!  In trying to identify what may be causing your spots, I totally missed ordering the cholesterol.  My fault entirely!  If you want to return for a lab visit, we can do that OR we can just wait until your next appt.  I'm ok w/ either option.

## 2023-09-16 NOTE — Telephone Encounter (Signed)
 Patient would like to come in for a lab only visit, I will order the future labs and schedule the visit.   Will you put the lab you would like ordered and the dx please

## 2023-09-17 ENCOUNTER — Other Ambulatory Visit (INDEPENDENT_AMBULATORY_CARE_PROVIDER_SITE_OTHER)

## 2023-09-17 ENCOUNTER — Encounter: Payer: Self-pay | Admitting: Family Medicine

## 2023-09-17 DIAGNOSIS — E7800 Pure hypercholesterolemia, unspecified: Secondary | ICD-10-CM | POA: Diagnosis not present

## 2023-09-17 LAB — LIPID PANEL
Cholesterol: 225 mg/dL — ABNORMAL HIGH (ref 0–200)
HDL: 60.5 mg/dL (ref >39.00)
LDL Cholesterol: 137 mg/dL — ABNORMAL HIGH (ref 0–99)
NonHDL: 164.21
Total CHOL/HDL Ratio: 4
Triglycerides: 138 mg/dL (ref 0.0–149.0)
VLDL: 27.6 mg/dL (ref 0.0–40.0)

## 2023-09-17 MED ORDER — ROSUVASTATIN CALCIUM 10 MG PO TABS: 10.0000 mg | ORAL_TABLET | Freq: Every day | ORAL | 1 refills | Status: DC

## 2023-09-17 NOTE — Addendum Note (Signed)
 Addended by: Yonael Tulloch K on: 09/17/2023 04:49 PM   Modules accepted: Orders

## 2023-10-31 ENCOUNTER — Other Ambulatory Visit (HOSPITAL_BASED_OUTPATIENT_CLINIC_OR_DEPARTMENT_OTHER): Payer: Self-pay | Admitting: Family

## 2023-10-31 DIAGNOSIS — E559 Vitamin D deficiency, unspecified: Secondary | ICD-10-CM

## 2023-10-31 DIAGNOSIS — I471 Supraventricular tachycardia, unspecified: Secondary | ICD-10-CM

## 2023-10-31 DIAGNOSIS — I4719 Other supraventricular tachycardia: Secondary | ICD-10-CM

## 2023-10-31 DIAGNOSIS — R002 Palpitations: Secondary | ICD-10-CM

## 2023-11-11 DIAGNOSIS — H906 Mixed conductive and sensorineural hearing loss, bilateral: Secondary | ICD-10-CM | POA: Diagnosis not present

## 2023-12-02 DIAGNOSIS — Z1231 Encounter for screening mammogram for malignant neoplasm of breast: Secondary | ICD-10-CM | POA: Diagnosis not present

## 2023-12-02 NOTE — Progress Notes (Unsigned)
  Electrophysiology Office Note:   Date:  12/03/2023  ID:  [name] [name], DOB Mar 19, 1956, MRN [id number]  Primary Cardiologist: Annabella Scarce, MD Primary Heart Failure: None Electrophysiologist: Will Gladis Norton, MD      History of Present Illness:   [name] [name] is a 68 y.o. female with h/o SVT, HLD, right breast CA s/p lumpectomy, XRT, anastrozole  seen today for routine electrophysiology followup.   Seen 09/10/23 to follow up on palpitations.  She spoke of her son who passed away at the age of 66 from a tachy-mediated cardiomyopathy. Her father and 4 of her 10 siblings have had issues with tachycardia. At that time, her most recent episode of SVT was while playing pickle ball, lasted 2-3 minutes and resolved spontaneously.   Since last being seen in our clinic the patient reports doing very well. After the change to Toprol  her symptoms are significantly less frequent, no early am awakenings with SVT & much less during [facility].     She denies chest pain, palpitations, dyspnea, PND, orthopnea, nausea, vomiting, dizziness, syncope, edema, weight gain, or early satiety.   Review of systems complete and found to be negative unless listed in HPI.   EP Information / Studies Reviewed:    EKG is not ordered today. EKG from 09/10/23 reviewed which showed NSR 60 bpm      Studies:  Cardiac Monitor 12/2020 > predominant rhythm SR, ave 71 bpm, min 47 bpm - 139 bpm max, 7 runs of SVT up to 4 beats, maximum HR in SVT 200 bpm, rare PVC's / PAC's  Coronary Calcium  Score 02/2022 > CCS of 0 ECHO 10/2022 > LVEF 65-70%, G1DD, LA mildly dilated, trivial mitral valve regurgitation   Arrhythmia / AAD SVT > s/p ablation 2004   Risk Assessment/Calculations:              Physical Exam:   VS:  BP 112/70   Pulse 68   Ht 5' 8 (1.727 m)   Wt 181 lb 3.2 oz (82.2 kg)   SpO2 97%   BMI 27.55 kg/m    Wt Readings from Last 3 Encounters:  12/03/23 181 lb 3.2 oz (82.2 kg)  09/10/23 179 lb (81.2 kg)   09/06/23 181 lb 2 oz (82.2 kg)     GEN: Well nourished, well developed in no acute distress NECK: No JVD; No carotid bruits CARDIAC: Regular rate and rhythm, no murmurs, rubs, gallops RESPIRATORY:  Clear to auscultation without rales, wheezing or rhonchi  ABDOMEN: Soft, non-tender, non-distended EXTREMITIES:  No edema; No deformity   ASSESSMENT AND PLAN:    SVT  Palpitations Suspected AT, hx of son passing away at age 17 from tachy-mediated cardiomyopathy, father and 4 of her 10 siblings have issues with tachycardia -Toprol  25 mg at bedtime > better on consolidated therapy / less early am waking symptoms   -if frequent symptoms, could consider AAD such as flecainide  -reduced burden of palpitations    Abdominal Rash  -work up per Dermatology > no specific answer for what her rash was but it spontaneously cleared   Follow up with Dr. Norton or EP APP March 2026    Signed, Daphne Barrack, NP-C, AGACNP-BC [facility] - Electrophysiology  12/03/2023, 5:05 PM

## 2023-12-03 ENCOUNTER — Encounter: Payer: Self-pay | Admitting: Pulmonary Disease

## 2023-12-03 ENCOUNTER — Ambulatory Visit: Attending: Cardiology | Admitting: Pulmonary Disease

## 2023-12-03 VITALS — BP 112/70 | HR 68 | Ht 68.0 in | Wt 181.2 lb

## 2023-12-03 DIAGNOSIS — I4719 Other supraventricular tachycardia: Secondary | ICD-10-CM | POA: Diagnosis not present

## 2023-12-03 DIAGNOSIS — I471 Supraventricular tachycardia, unspecified: Secondary | ICD-10-CM | POA: Diagnosis not present

## 2023-12-03 DIAGNOSIS — E559 Vitamin D deficiency, unspecified: Secondary | ICD-10-CM | POA: Diagnosis not present

## 2023-12-03 DIAGNOSIS — R002 Palpitations: Secondary | ICD-10-CM

## 2023-12-03 MED ORDER — METOPROLOL SUCCINATE ER 25 MG PO TB24: 25.0000 mg | ORAL_TABLET | Freq: Every evening | ORAL | 3 refills | Status: AC

## 2023-12-03 NOTE — Patient Instructions (Signed)
 Medication Instructions:  Your physician recommends that you continue on your current medications as directed. Please refer to the Current Medication list given to you today.  *If you need a refill on your cardiac medications before your next appointment, please call your pharmacy*  Lab Work: None ordered If you have labs (blood work) drawn today and your tests are completely normal, you will receive your results only by: MyChart Message (if you have MyChart) OR A paper copy in the mail If you have any lab test that is abnormal or we need to change your treatment, we will call you to review the results.  Follow-Up: At [facility], you and your health needs are our priority.  As part of our continuing mission to provide you with exceptional heart care, our providers are all part of one team.  This team includes your primary Cardiologist (physician) and Advanced Practice Providers or APPs (Physician Assistants and Nurse Practitioners) who all work together to provide you with the care you need, when you need it.  Your next appointment:   8 month(s)  Provider:   You may see Will Gladis Norton, MD or one of the following Advanced Practice Providers on your designated Care Team:   Charlies Arthur, PA-C Michael Andy Tillery, PA-C Suzann Riddle, NP Daphne Barrack, NP

## 2023-12-06 ENCOUNTER — Encounter: Payer: Self-pay | Admitting: Hematology and Oncology

## 2023-12-16 DIAGNOSIS — H906 Mixed conductive and sensorineural hearing loss, bilateral: Secondary | ICD-10-CM | POA: Diagnosis not present

## 2023-12-30 DIAGNOSIS — H903 Sensorineural hearing loss, bilateral: Secondary | ICD-10-CM | POA: Diagnosis not present

## 2023-12-31 DIAGNOSIS — G4733 Obstructive sleep apnea (adult) (pediatric): Secondary | ICD-10-CM | POA: Diagnosis not present

## 2024-01-20 ENCOUNTER — Telehealth: Payer: Self-pay

## 2024-01-20 NOTE — Telephone Encounter (Signed)
 Copied from CRM #[id number]. Topic: Clinical - Request for Lab/Test Order >> Jan 20, 2024 11:26 AM Suzen RAMAN wrote: Reason for CRM: Patient would like to have labs order placed to have her cholesterol levels rechecked.  [phone]

## 2024-01-20 NOTE — Telephone Encounter (Signed)
 Called patient to schedule OV for medication check and labs. Scheduled for 01/30/24 to address lab work question

## 2024-01-30 ENCOUNTER — Ambulatory Visit: Payer: Self-pay | Admitting: Family Medicine

## 2024-01-30 ENCOUNTER — Ambulatory Visit (INDEPENDENT_AMBULATORY_CARE_PROVIDER_SITE_OTHER): Admitting: Family Medicine

## 2024-01-30 ENCOUNTER — Encounter: Payer: Self-pay | Admitting: Family Medicine

## 2024-01-30 VITALS — BP 100/78 | HR 63 | Temp 97.9°F | Resp 14 | Ht 68.0 in | Wt 182.4 lb

## 2024-01-30 DIAGNOSIS — E7800 Pure hypercholesterolemia, unspecified: Secondary | ICD-10-CM | POA: Diagnosis not present

## 2024-01-30 DIAGNOSIS — E663 Overweight: Secondary | ICD-10-CM

## 2024-01-30 LAB — CBC WITH DIFFERENTIAL/PLATELET
Basophils Absolute: 0 K/uL (ref 0.0–0.1)
Basophils Relative: 0.8 % (ref 0.0–3.0)
Eosinophils Absolute: 0.1 K/uL (ref 0.0–0.7)
Eosinophils Relative: 2.7 % (ref 0.0–5.0)
HCT: 42.4 % (ref 36.0–46.0)
Hemoglobin: 14.1 g/dL (ref 12.0–15.0)
Lymphocytes Relative: 38 % (ref 12.0–46.0)
Lymphs Abs: 1.6 K/uL (ref 0.7–4.0)
MCHC: 33.2 g/dL (ref 30.0–36.0)
MCV: 95.4 fl (ref 78.0–100.0)
Monocytes Absolute: 0.5 K/uL (ref 0.1–1.0)
Monocytes Relative: 11.8 % (ref 3.0–12.0)
Neutro Abs: 2 K/uL (ref 1.4–7.7)
Neutrophils Relative %: 46.7 % (ref 43.0–77.0)
Platelets: 200 K/uL (ref 150.0–400.0)
RBC: 4.45 Mil/uL (ref 3.87–5.11)
RDW: 13.6 % (ref 11.5–15.5)
WBC: 4.3 K/uL (ref 4.0–10.5)

## 2024-01-30 LAB — LIPID PANEL
Cholesterol: 182 mg/dL (ref 0–200)
HDL: 66.3 mg/dL (ref >39.00)
LDL Cholesterol: 91 mg/dL (ref 0–99)
NonHDL: 115.51
Total CHOL/HDL Ratio: 3
Triglycerides: 121 mg/dL (ref 0.0–149.0)
VLDL: 24.2 mg/dL (ref 0.0–40.0)

## 2024-01-30 LAB — HEPATIC FUNCTION PANEL
ALT: 16 U/L (ref 0–35)
AST: 16 U/L (ref 0–37)
Albumin: 4.3 g/dL (ref 3.5–5.2)
Alkaline Phosphatase: 46 U/L (ref 39–117)
Bilirubin, Direct: 0.1 mg/dL (ref 0.0–0.3)
Total Bilirubin: 0.7 mg/dL (ref 0.2–1.2)
Total Protein: 7 g/dL (ref 6.0–8.3)

## 2024-01-30 LAB — BASIC METABOLIC PANEL WITH GFR
BUN: 15 mg/dL (ref 6–23)
CO2: 30 meq/L (ref 19–32)
Calcium: 9.2 mg/dL (ref 8.4–10.5)
Chloride: 102 meq/L (ref 96–112)
Creatinine, Ser: 0.77 mg/dL (ref 0.40–1.20)
GFR: 79.13 mL/min (ref >60.00)
Glucose, Bld: 91 mg/dL (ref 70–99)
Potassium: 4.9 meq/L (ref 3.5–5.1)
Sodium: 138 meq/L (ref 135–145)

## 2024-01-30 LAB — TSH: TSH: 1.37 u[IU]/mL (ref 0.35–5.50)

## 2024-01-30 NOTE — Progress Notes (Signed)
 Lab results have been discussed.   Verbalized understanding? Yes  Are there any questions? No

## 2024-01-30 NOTE — Patient Instructions (Signed)
Schedule your complete physical in 6 months We'll notify you of your lab results and make any changes if needed Continue to work on healthy diet and regular exercise- you look great! Call with any questions or concerns Stay Safe!  Stay Healthy! Happy Fall!!!

## 2024-01-30 NOTE — Assessment & Plan Note (Signed)
 Chronic problem.  Weight and BMI are stable.  Pt is exercising regularly.  Applauded her efforts.  Will continue to follow.

## 2024-01-30 NOTE — Progress Notes (Signed)
   Subjective:    Patient ID: [name] [name], female    DOB: Apr 19, 1956, 68 y.o.   MRN: [id number]  HPI Hyperlipidemia- ongoing issue.  Currently on Crestor  5mg  daily.  No CP, SOB, abd pain, N/V.  Overweight- weight and BMI are stable at 182 lbs and 27.73 respectively.  Continues to exercise regularly- plays pickle ball.   Review of Systems For ROS see HPI     Objective:   Physical Exam Vitals reviewed.  Constitutional:      General: She is not in acute distress.    Appearance: Normal appearance. She is well-developed. She is not ill-appearing.  HENT:     Head: Normocephalic and atraumatic.  Eyes:     Conjunctiva/sclera: Conjunctivae normal.     Pupils: Pupils are equal, round, and reactive to light.  Neck:     Thyroid : No thyromegaly.  Cardiovascular:     Rate and Rhythm: Normal rate and regular rhythm.     Pulses: Normal pulses.     Heart sounds: Normal heart sounds. No murmur heard. Pulmonary:     Effort: Pulmonary effort is normal. No respiratory distress.     Breath sounds: Normal breath sounds.  Abdominal:     General: There is no distension.     Palpations: Abdomen is soft.     Tenderness: There is no abdominal tenderness.  Musculoskeletal:     Cervical back: Normal range of motion and neck supple.  Lymphadenopathy:     Cervical: No cervical adenopathy.  Skin:    General: Skin is warm and dry.  Neurological:     General: No focal deficit present.     Mental Status: She is alert and oriented to person, place, and time.  Psychiatric:        Behavior: Behavior normal.           Assessment & Plan:

## 2024-01-30 NOTE — Assessment & Plan Note (Signed)
Chronic problem.  Currently on Crestor 5mg  daily w/o difficulty.  Check labs.  Adjust meds prn

## 2024-02-06 DIAGNOSIS — L718 Other rosacea: Secondary | ICD-10-CM | POA: Diagnosis not present

## 2024-03-02 DIAGNOSIS — H02836 Dermatochalasis of left eye, unspecified eyelid: Secondary | ICD-10-CM | POA: Diagnosis not present

## 2024-03-02 DIAGNOSIS — H2513 Age-related nuclear cataract, bilateral: Secondary | ICD-10-CM | POA: Diagnosis not present

## 2024-03-02 DIAGNOSIS — H353131 Nonexudative age-related macular degeneration, bilateral, early dry stage: Secondary | ICD-10-CM | POA: Diagnosis not present

## 2024-03-02 DIAGNOSIS — H04123 Dry eye syndrome of bilateral lacrimal glands: Secondary | ICD-10-CM | POA: Diagnosis not present

## 2024-03-02 DIAGNOSIS — H02833 Dermatochalasis of right eye, unspecified eyelid: Secondary | ICD-10-CM | POA: Diagnosis not present

## 2024-06-10 ENCOUNTER — Other Ambulatory Visit: Payer: Self-pay | Admitting: Family Medicine

## 2024-06-10 DIAGNOSIS — E785 Hyperlipidemia, unspecified: Secondary | ICD-10-CM

## 2024-07-30 ENCOUNTER — Encounter: Admitting: Family Medicine

## 2024-09-07 ENCOUNTER — Ambulatory Visit: Admitting: Hematology and Oncology
